# Patient Record
Sex: Female | Born: 1935
Health system: Southern US, Community
[De-identification: ages and names within clinical notes are randomized; demographics above are authoritative.]

## PROBLEM LIST (undated history)

## (undated) DIAGNOSIS — A389 Scarlet fever, uncomplicated: Secondary | ICD-10-CM

## (undated) DIAGNOSIS — I639 Cerebral infarction, unspecified: Secondary | ICD-10-CM

## (undated) DIAGNOSIS — E7800 Pure hypercholesterolemia, unspecified: Secondary | ICD-10-CM

## (undated) DIAGNOSIS — R55 Syncope and collapse: Secondary | ICD-10-CM

## (undated) HISTORY — DX: Syncope and collapse: R55

## (undated) HISTORY — PX: ABDOMINAL HYSTERECTOMY: SHX81

## (undated) HISTORY — DX: Cerebral infarction, unspecified: I63.9

---
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
## 2004-05-01 ENCOUNTER — Ambulatory Visit (HOSPITAL_COMMUNITY): Admission: RE | Admit: 2004-05-01 | Discharge: 2004-05-01 | Payer: Self-pay | Admitting: Gastroenterology

## 2007-02-27 ENCOUNTER — Encounter: Admission: RE | Admit: 2007-02-27 | Discharge: 2007-02-27 | Payer: Self-pay | Admitting: Orthopedic Surgery

## 2007-07-24 ENCOUNTER — Ambulatory Visit (HOSPITAL_BASED_OUTPATIENT_CLINIC_OR_DEPARTMENT_OTHER): Admission: RE | Admit: 2007-07-24 | Discharge: 2007-07-24 | Payer: Self-pay | Admitting: Orthopedic Surgery

## 2008-12-13 HISTORY — PX: TOTAL KNEE ARTHROPLASTY: SHX125

## 2009-04-14 ENCOUNTER — Inpatient Hospital Stay (HOSPITAL_COMMUNITY): Admission: RE | Admit: 2009-04-14 | Discharge: 2009-04-18 | Payer: Self-pay | Admitting: Orthopedic Surgery

## 2011-03-23 LAB — CBC
HCT: 26.5 % — ABNORMAL LOW (ref 36.0–46.0)
HCT: 26.8 % — ABNORMAL LOW (ref 36.0–46.0)
HCT: 29.8 % — ABNORMAL LOW (ref 36.0–46.0)
Hemoglobin: 10.2 g/dL — ABNORMAL LOW (ref 12.0–15.0)
Hemoglobin: 9.2 g/dL — ABNORMAL LOW (ref 12.0–15.0)
MCHC: 34.2 g/dL (ref 30.0–36.0)
MCHC: 35.1 g/dL (ref 30.0–36.0)
MCV: 91.3 fL (ref 78.0–100.0)
MCV: 91.7 fL (ref 78.0–100.0)
MCV: 92.1 fL (ref 78.0–100.0)
Platelets: 127 10*3/uL — ABNORMAL LOW (ref 150–400)
Platelets: 140 10*3/uL — ABNORMAL LOW (ref 150–400)
RBC: 2.9 MIL/uL — ABNORMAL LOW (ref 3.87–5.11)
RBC: 3.23 MIL/uL — ABNORMAL LOW (ref 3.87–5.11)
RDW: 14 % (ref 11.5–15.5)
WBC: 5.1 10*3/uL (ref 4.0–10.5)
WBC: 5.3 10*3/uL (ref 4.0–10.5)

## 2011-03-23 LAB — BASIC METABOLIC PANEL
BUN: 9 mg/dL (ref 6–23)
CO2: 30 mEq/L (ref 19–32)
Chloride: 103 mEq/L (ref 96–112)
Chloride: 105 mEq/L (ref 96–112)
GFR calc Af Amer: >60 mL/min (ref >60)
GFR calc non Af Amer: >60 mL/min (ref >60)
Glucose, Bld: 116 mg/dL — ABNORMAL HIGH (ref 70–99)
Potassium: 3.8 mEq/L (ref 3.5–5.1)
Potassium: 4.1 mEq/L (ref 3.5–5.1)
Sodium: 139 mEq/L (ref 135–145)
Sodium: 139 mEq/L (ref 135–145)

## 2011-03-23 LAB — CARDIAC PANEL(CRET KIN+CKTOT+MB+TROPI)
CK, MB: 2.6 ng/mL (ref 0.3–4.0)
Relative Index: 2.5 (ref 0.0–2.5)
Total CK: 103 U/L (ref 7–177)

## 2011-03-23 LAB — PROTIME-INR: Prothrombin Time: 31.2 seconds — ABNORMAL HIGH (ref 11.6–15.2)

## 2011-03-23 LAB — ABO/RH: ABO/RH(D): A NEG

## 2011-03-23 LAB — TYPE AND SCREEN: ABO/RH(D): A NEG

## 2011-03-24 LAB — URINALYSIS, ROUTINE W REFLEX MICROSCOPIC
Ketones, ur: NEGATIVE mg/dL
Nitrite: NEGATIVE
Protein, ur: NEGATIVE mg/dL

## 2011-03-24 LAB — COMPREHENSIVE METABOLIC PANEL
BUN: 23 mg/dL (ref 6–23)
CO2: 25 mEq/L (ref 19–32)
Calcium: 9.1 mg/dL (ref 8.4–10.5)
Creatinine, Ser: 0.78 mg/dL (ref 0.4–1.2)
GFR calc Af Amer: >60 mL/min (ref >60)
GFR calc non Af Amer: >60 mL/min (ref >60)
Glucose, Bld: 145 mg/dL — ABNORMAL HIGH (ref 70–99)

## 2011-03-24 LAB — CBC
Hemoglobin: 12.8 g/dL (ref 12.0–15.0)
MCHC: 34.9 g/dL (ref 30.0–36.0)
MCV: 91.1 fL (ref 78.0–100.0)
RBC: 4.01 MIL/uL (ref 3.87–5.11)

## 2011-03-24 LAB — APTT: aPTT: 29 seconds (ref 24–37)

## 2011-03-24 LAB — PROTIME-INR
INR: 1 (ref 0.00–1.49)
Prothrombin Time: 13.2 seconds (ref 11.6–15.2)

## 2011-03-24 LAB — URINE MICROSCOPIC-ADD ON

## 2011-04-27 NOTE — Discharge Summary (Signed)
Sheila Parrish, Sheila Parrish                ACCOUNT NO.:  [id number]   MEDICAL RECORD NO.:  [id number]          PATIENT TYPE:  INP   LOCATION:  1604                         FACILITY:  [facility]   PHYSICIAN:  Ollen Gross, M.D.    DATE OF BIRTH:  07/29/1927   DATE OF ADMISSION:  04/14/2009  DATE OF DISCHARGE:  04/18/2009                               DISCHARGE SUMMARY   ADMITTING DIAGNOSES:  1. Osteoarthritis of left knee.  2. Hypercholesterolemia.  3. Past history of scarlet fever.  4. Postmenopausal.  5. Childhood illnesses of measles   DISCHARGE DIAGNOSES:  1. Osteoarthritis of the left knee, status post left total knee      replacement arthroplasty.  2. Postoperative acute blood loss anemia; did not require transfusion.  3. Hypercholesterolemia.  4. Past history of scarlet fever.  5. Postmenopausal.  6. Childhood illnesses of measles   PROCEDURE:  Apr 14, 2009 left total knee.   ASSISTANT:  Alexzandrew L. Perkins, P.A.C.   ANESTHESIA:  Under spinal anesthesia with Duramorph added.   TOURNIQUET TIME:  27 minutes.   CONSULTS:  None.   BRIEF HISTORY:  Ms. Cesaro is an 77 year old female with end-stage  arthritis of the left knee with progressive worsening pain and  dysfunction.  Failed nonoperative management and now presents for left  total knee arthroplasty.   LABORATORY DATA:  Preop CBC:  Hemoglobin 12.8, hematocrit 36.6, white  cell count 4.9, platelets 200.  PT/INR 13.2/1.0 with PTT of 29.  Chemistry panel on admission:  All within normal limits, with exception  of mildly elevated glucose of 145.  Preop UA:  Small leukocytes, 36  white cells, few bacteria.  Serial CBCs followed throughout the hospital  course.  Hemoglobin dropped down to 10.2 and then 9.2, where it was  stabilized; came back up and the last hemoglobin 9.3 and hematocrit  26.5.  Serial Prothrombin times followed per Coumadin protocol.  Last  PT/INR 31.2 and 2.8.  Serial BMETs followed.  Electrolytes  remained  within normal limits.  She had one set of cardiac enzymes taken on Apr 17, 2009; CK normal at 103, CK-MB normal at 2.6, relative index normal at  2.5.  Troponin normal at 0.01.   X-RAYS:  Two-view chest April 07, 2009:  No active cardiopulmonary  disease.  EKG preop April 07, 2009:  Normal sinus rhythm, no significant  change from last tracing performed by Dr. Deloris Ping. Nahser.  Follow-up  EKG on Apr 17, 2009:  Normal sinus rhythm and nonspecific T-wave  abnormalities unconfirmed; but did not show any obvious changes as  compared to the previous EKG.   HOSPITAL COURSE:  The patient was admitted to [facility] and  taken to OR, underwent above-stated procedure without complication.  The  patient tolerated the procedure well, later transferred from the  recovery room to the orthopedic floor.  Started on PCA and p.o.  analgesic for pain control following surgery.   Doing pretty good on the morning of day #1; actually able to get some  sleep.  Was still using the PCA, so we continued  that and encouraged to  get over to p.o.  medications.  Had a decent output and followed this.  Started back on her home medications.  Hemoglobin was stable.  Blood  pressure looked good.   By day #2 she started to have a little bit of discomfort, a little  dizziness when she got up; but her hemoglobin was stable.  Felt to be  due to the narcotics.  We checked some orthostatics.  She was a little  orthostatic, but he felt that was due to some of the IV narcotics.  We  discontinued the PCA and the pressures did improve by the next day,  although she had a little bit of nausea.   Hemoglobin remained stable and she was rechecked on day #3.  Her  hemoglobin was 9.3.  The incision looked good.  She was slowly  progressing with physical therapy, but was unable to a lot of therapy on  day #3 because when she got up  she got a little dizzy again.  We  changed her medications and she had a little  bit of nausea.  We checked  an EKG, which did not show any changes; and also one set of cardiac  enzymes just to make sure there was no underlying cardiac issues.  Cardiac enzymes were normal.  EKG did not show any significant changes  or obvious changes.  Hemoglobin this day was felt to be due to the  narcotics.  We decreased the medications.  She continued with good urine  output.  By that afternoon her nausea had improved and she was up  walking about 95 feet on the end of day #3.   She was seen back on rounds on postoperative day #4.  Her GI symptoms  had resolved, it was felt be due to the pain medications.  This got  better after things were changed and went over to Ultracet.  Her INR was  therapeutic, actually at 2.8.  She was seen in rounds by Dr. Lequita Halt and  wanted to go home.   DISCHARGE PLAN:  1. Patient discharged home on Apr 18, 2009.  2. Discharge diagnoses:  Please see above.  3. Discharge Medications:  Ultracet, Robaxin and Coumadin.   FOLLOW-UP:  In 2 weeks.   ACTIVITY:  She is weightbearing as tolerated.  Total knee protocol to  left knee.  Home health PT.  Home health nursing may start showering  her; however, do not submerge incision under water.   DISPOSITION:  Home.   CONDITION ON DISCHARGE:  Improved.      Alexzandrew L. Perkins, P.A.C.      Ollen Gross, M.D.  Electronically Signed    ALP/MEDQ  D:  04/18/2009  T:  04/18/2009  Job:  [id number]   cc:   Thora Lance, M.D.  Fax: [phone]

## 2011-04-27 NOTE — Op Note (Signed)
NAMEAMYRAH, Sheila Parrish                ACCOUNT NO.:  [id number]   MEDICAL RECORD NO.:  [id number]          PATIENT TYPE:  AMB   LOCATION:  NESC                         FACILITY:  [facility]   PHYSICIAN:  Ollen Gross, M.D.    DATE OF BIRTH:  07/16/1927   DATE OF PROCEDURE:  07/24/2007  DATE OF DISCHARGE:                               OPERATIVE REPORT   PREOPERATIVE DIAGNOSIS:  Left knee medial meniscal tear and chondral  defect.   POSTOPERATIVE DIAGNOSIS:  Left knee medial meniscal tear and chondral  defect.   PROCEDURE:  Left knee arthroscopy with medial meniscal debridement and  chondroplasty.   SURGEON:  Dr. Lequita Halt.   ASSISTANT:  No assistant.   ANESTHESIA:  Local with MAC.   ESTIMATED BLOOD LOSS:  Minimal.   DRAIN:  None.   COMPLICATIONS:  None.   CONDITION.:  Stable to recovery.   CLINICAL NOTE:  [name] is a 77 year old female who has had a long  history of left knee pain and mechanical symptoms.  Exam and history  suggested meniscal tear.  She had one injection which provided temporary  relief.  Unfortunately pain has worsened and she presents now for  arthroscopic debridement.   DESCRIPTION OF PROCEDURE:  After successful administration of local with  MAC anesthetic. A tourniquet was placed high on the left thigh and left  lower extremity prepped and draped in the usual sterile fashion.  Her  portal sites are not included in the block and thus with 20 mL of 1%  Xylocaine with epi, I injected the superomedial, inferomedial and  inferolateral portal sites with a total of 20 mL of __________ 1% with  epi.  I then made superomedial and inferolateral incisions and passed  the inflow cannula superomedial __________  passed it inferolateral.  Arthroscopic visualization proceeds.  The undersurface of the patella  and the trochlea both looked normal.  The medial and lateral gutters  were visualized and there was no evidence of any loose bodies.  Flexion  and valgus force  was applied to the knee and the medial compartment is  entered.  There is evidence of a significant tear in the body and  posterior horn of the medial meniscus which was displaced.  A spinal  needle was used to localize the inferomedial portal, a small incision  made, dilator placed and the meniscus debrided back to a stable base  with baskets and a 4.2-mm shaver.  There is also evidence of exposed  bone and a very small focal area about 0.5 x 0.5 cm on the medial  femoral condyle.  There was also an area of grade 3 chondromalacia about  1 x 1 cm around this.  I debrided this back to a stable base with stable  edges using the shaver.  It is probed and found to be stable. The  intercondylar notch was visualized, the ACL is normal.  The lateral  compartment is entered and it is normal. The joint is again inspected,  no further tears, defects or loose bodies.  The arthroscopic equipment  was then removed  from the  inferior portals which are closed with interrupted 4-0 nylon.  20 mL of 0.25% Marcaine with epi injected through the inflow cannula and  that is removed and that portal closed with nylon.  A bulky sterile  dressing is applied and she is then awakened and transported to recovery  in stable condition.      Ollen Gross, M.D.  Electronically Signed     FA/MEDQ  D:  07/24/2007  T:  07/25/2007  Job:  [id number]

## 2011-04-27 NOTE — Op Note (Signed)
[name], CHRISTENBERRY NO.:  [id number]   MEDICAL RECORD NO.:  [id number]          PATIENT TYPE:  INP   LOCATION:  0002                         FACILITY:  [facility]   PHYSICIAN:  Ollen Gross, M.D.    DATE OF BIRTH:  [birth year]   DATE OF PROCEDURE:  04/14/2009  DATE OF DISCHARGE:                               OPERATIVE REPORT   PREOPERATIVE DIAGNOSIS:  Osteoarthritis, left knee.   POSTOPERATIVE DIAGNOSIS:  Osteoarthritis, left knee.   PROCEDURE:  Left total knee arthroplasty.   SURGEON:  Ollen Gross, M.D.   ASSISTANT:  Avel Peace, PA-C   ANESTHESIA:  Spinal.   ESTIMATED BLOOD LOSS:  Minimal.   DRAINS:  None.   TOURNIQUET TIME:  27 minutes at 300 mmHg.   COMPLICATIONS:  None.   CONDITION:  Stable to recovery.   BRIEF CLINICAL NOTE:  Sheila Parrish is an 77 year old female with end-  stage arthritis of the left knee with progressively worsening pain and  dysfunction.  She has failed nonoperative management and presents now  for left total knee arthroplasty.   PROCEDURE IN DETAIL:  After successful administration of spinal  anesthetic, a tourniquet was placed on her left thigh and her left lower  extremity was prepped and draped in the usual sterile fashion.  The  extremity was wrapped in Esmarch, knee flexed, tourniquet inflated to  300 mmHg.  Midline incision was made with a 10 blade through  subcutaneous tissue to the level of the extensor mechanism.  A fresh  blade was used to make a medial parapatellar arthrotomy.  Soft tissue of  the proximal medial tibia subperiosteally elevated to the joint line  with the knife and at the semimembranosus bursa with a Cobb elevator.  Soft tissue laterally is elevated with attention being paid to avoid the  patellar tendon on tibial tubercle.  Patella subluxed laterally, knee  flexed 90 degrees, and ACL and PCL removed.  A drill was used to create  a starting hole in the distal femur and the canal was thoroughly  irrigated.  The 5-degree left valgus alignment guide was placed  referencing off the posterior condyles, rotations marked, and a block  pinned to remove 10 mm off the distal femur.  Distal femoral resection  is made with an oscillating saw.  Sizing blocks placed, size 2.5 is most  appropriate.  Rotation is marked off the epicondylar axis.  Size 2.5  cutting block is placed and the anterior, posterior and chamfer cuts  made.   Tibia subluxed forward and menisci removed.  Extramedullary tibial  alignment guide is placed referencing proximally at the medial aspect of  the tibial tubercle and distally along the second metatarsal axis and  tibial crest.  Block is pinned to remove 10 mm off the nondeficient  lateral side.  Tibial resection is made with an oscillating saw.  Size  2.5 is the most appropriate tibial component and the proximal tibia is  prepared to modular drill and keel punch for the size 2.5.  Femoral  preparation is completed with the intercondylar cut.   A size 2.5  mobile bearing tibial trial, 2.5 posterior stabilized femoral  trial and a 10-mm posterior stabilized rotating platform insert trial  are placed.  With the 10, full extension is achieved with excellent  varus-valgus and anterior-posterior balance throughout full range of  motion.  Patella was then everted and thickness measured to be 22 mm.  Freehand resection was taken at 12 mm, 35 template is placed, lug holes  are drilled, trial patella is placed and it tracks normally.  Osteophytes were removed off the posterior femur with the trial in  place.  All trials were removed and the cut bone surfaces are prepared  with pulsatile lavage.  Cement was mixed and once ready for  implantation, the size 2.5 mobile bearing tibial tray, 2.5 posterior  stabilized femur and 35 patella are cemented into place.  The patella  was held with a clamp.  Trial 10-mm insert is placed, knee held in full  extension, and all extruded cement  removed.  When the cement is fully  hardened and the knee permanent, 10-mm posterior stabilized rotating  platform insert is placed into the tibial tray.  The wound was copiously  irrigated with saline solution and FloSeal injected onto the posterior  capsule, medial and lateral gutters and suprapatellar area.  Moist  sponge is placed and tourniquet released for a total time of 27 minutes.  Sponge was held for 2 minutes and then removed.  Minimal bleeding is  encountered.  The bleeding that is encountered is stopped with  electrocautery.  Wounds again irrigated and the arthrotomy closed with  interrupted #1 PDS.  Flexion against gravity is about 140 degrees.  Subcu tissues were closed with interrupted 2-0 Vicryl and subcuticular  running 4-0 Monocryl.  The incision is cleaned and dried and Steri-  Strips and a bulky sterile dressing are applied.  She is then placed  into a knee immobilizer, awakened, and transported to recovery in stable  condition.      Ollen Gross, M.D.  Electronically Signed     FA/MEDQ  D:  04/14/2009  T:  04/14/2009  Job:  [id number]

## 2011-04-30 NOTE — H&P (Signed)
NAMESHAMIR, Sheila Parrish                ACCOUNT NO.:  [id number]   MEDICAL RECORD NO.:  [id number]          PATIENT TYPE:  INP   LOCATION:  NA                           FACILITY:  [facility]   PHYSICIAN:  Ollen Gross, M.D.    DATE OF BIRTH:  07/23/1927   DATE OF ADMISSION:  04/14/2009  DATE OF DISCHARGE:                              HISTORY & PHYSICAL   CHIEF COMPLAINT:  Left knee pain.   HISTORY OF PRESENT ILLNESS:  The patient is an 77 year old female who  has seen by Dr. Lequita Halt for a chief complaint of left knee pain that has  been ongoing for quite some time now.  It is worse with bending and  squatting.  She is seen in the office and found to have significant  narrowing medial compartment which is now gone on to bone-on-bone  patellofemoral narrowing, markedly increased arthritis over the past  year.  She has undergone arthroscopy in the past.  She has also  undergone cortisone injections, viscous supplementation injections  without benefit.  Felt she would benefit undergoing surgical  intervention.  Risks and benefits have been discussed.  She elects to  proceed with surgery.  She has been seen preoperatively by Dr. Kirby Funk and felt to be stable for surgery.   ALLERGIES:  SULFA, BACTRIM, MACRODANTIN, ERYTHROMYCIN.   CURRENT MEDICATIONS:  1. Simvastatin 20 mg daily.  2. Calcium plus D 1500 mg daily.  3. Multivitamin daily.   PAST MEDICAL HISTORY:  Hypercholesterolemia.  Past history of scarlet  fever, postmenopausal.  Childhood illnesses of measles.   PAST SURGICAL HISTORY:  Colon surgery 1950, hysterectomy 1979.   FAMILY HISTORY:  Noncontributory.   SOCIAL HISTORY:  Married homemaker, nonsmoker.  No alcohol.  Three  children.  Lives with husband.  Husband and daughter will be assisting  with care after surgery.  She does have four steps entering her 2-level  home.  She does have a full flight of steps and she does have a living  will and healthcare power of  attorney.   REVIEW OF SYSTEMS:  GENERAL:  No fevers, chills or night sweats.  NEURO:  No seizures, syncope, or paralysis.  RESPIRATORY:  No productive cough  or hemoptysis.  CARDIOVASCULAR:  No chest pain, angina.  GI: No nausea,  diarrhea, constipation.  GU: No dysuria or discharge.  MUSCULOSKELETAL:  Knee pain.   PHYSICAL:  VITAL SIGNS:  Pulse 76, respirations 14, blood pressure  116/68.  GENERAL:  An 77 year old white female well-nourished, well-developed,  short-statured.  She is alert and cooperative, pleasant.  HEENT:  Normocephalic, atraumatic.  Pupils are reactive.  EOMs intact.  NECK:  Supple.  CHEST:  Clear.  HEART:  Regular rate and rhythm.  No murmur, S1, S2.  ABDOMEN:  Soft, nontender.  Bowel sounds present.  RECTAL, BREASTS, GENITALIA:  Not done not pertinent to present illness.  EXTREMITIES:  Left knee no effusion.  Range of motion 5-120 marked  crepitus.   IMPRESSION:  Osteoarthritis left knee.   PLAN:  The patient admitted [facility] undergo a left total  knee  replacement arthroplasty.  Surgery will be performed by Dr. Ollen Gross.      Alexzandrew L. Perkins, P.A.C.      Ollen Gross, M.D.  Electronically Signed    ALP/MEDQ  D:  04/10/2009  T:  04/10/2009  Job:  [id number]   cc:   Thora Lance, M.D.  Fax: [phone]   Ollen Gross, M.D.  Fax: [phone]

## 2011-09-27 LAB — POCT HEMOGLOBIN-HEMACUE: Hemoglobin: 13.5

## 2012-05-09 DIAGNOSIS — G4700 Insomnia, unspecified: Secondary | ICD-10-CM | POA: Diagnosis not present

## 2012-05-09 DIAGNOSIS — Z1331 Encounter for screening for depression: Secondary | ICD-10-CM | POA: Diagnosis not present

## 2012-05-09 DIAGNOSIS — E785 Hyperlipidemia, unspecified: Secondary | ICD-10-CM | POA: Diagnosis not present

## 2012-06-23 DIAGNOSIS — Z79899 Other long term (current) drug therapy: Secondary | ICD-10-CM | POA: Diagnosis not present

## 2012-06-23 DIAGNOSIS — E785 Hyperlipidemia, unspecified: Secondary | ICD-10-CM | POA: Diagnosis not present

## 2012-08-07 DIAGNOSIS — H00019 Hordeolum externum unspecified eye, unspecified eyelid: Secondary | ICD-10-CM | POA: Diagnosis not present

## 2012-08-24 DIAGNOSIS — H00019 Hordeolum externum unspecified eye, unspecified eyelid: Secondary | ICD-10-CM | POA: Diagnosis not present

## 2012-08-24 DIAGNOSIS — K208 Other esophagitis without bleeding: Secondary | ICD-10-CM | POA: Diagnosis not present

## 2012-08-31 DIAGNOSIS — H2510 Age-related nuclear cataract, unspecified eye: Secondary | ICD-10-CM | POA: Diagnosis not present

## 2012-08-31 DIAGNOSIS — H25019 Cortical age-related cataract, unspecified eye: Secondary | ICD-10-CM | POA: Diagnosis not present

## 2012-09-05 DIAGNOSIS — Z23 Encounter for immunization: Secondary | ICD-10-CM | POA: Diagnosis not present

## 2012-11-01 DIAGNOSIS — Z1231 Encounter for screening mammogram for malignant neoplasm of breast: Secondary | ICD-10-CM | POA: Diagnosis not present

## 2013-02-08 DIAGNOSIS — L570 Actinic keratosis: Secondary | ICD-10-CM | POA: Diagnosis not present

## 2013-02-21 ENCOUNTER — Emergency Department (HOSPITAL_COMMUNITY): Payer: Medicare Other

## 2013-02-21 ENCOUNTER — Emergency Department (HOSPITAL_COMMUNITY)
Admission: EM | Admit: 2013-02-21 | Discharge: 2013-02-21 | Disposition: A | Discharge status: Home / Self Care | Payer: Medicare Other | Attending: Emergency Medicine | Admitting: Emergency Medicine

## 2013-02-21 ENCOUNTER — Encounter (HOSPITAL_COMMUNITY): Payer: Self-pay | Admitting: Family Medicine

## 2013-02-21 DIAGNOSIS — Z79899 Other long term (current) drug therapy: Secondary | ICD-10-CM | POA: Insufficient documentation

## 2013-02-21 DIAGNOSIS — E7800 Pure hypercholesterolemia, unspecified: Secondary | ICD-10-CM | POA: Insufficient documentation

## 2013-02-21 DIAGNOSIS — R079 Chest pain, unspecified: Secondary | ICD-10-CM | POA: Diagnosis not present

## 2013-02-21 DIAGNOSIS — K219 Gastro-esophageal reflux disease without esophagitis: Secondary | ICD-10-CM | POA: Insufficient documentation

## 2013-02-21 DIAGNOSIS — R109 Unspecified abdominal pain: Secondary | ICD-10-CM | POA: Diagnosis not present

## 2013-02-21 DIAGNOSIS — R072 Precordial pain: Secondary | ICD-10-CM | POA: Diagnosis not present

## 2013-02-21 HISTORY — DX: Pure hypercholesterolemia, unspecified: E78.00

## 2013-02-21 LAB — CBC
MCV: 88.9 fL (ref 78.0–100.0)
Platelets: 162 10*3/uL (ref 150–400)
RBC: 4.07 MIL/uL (ref 3.87–5.11)
RDW: 13.7 % (ref 11.5–15.5)
WBC: 3.7 10*3/uL — ABNORMAL LOW (ref 4.0–10.5)

## 2013-02-21 LAB — COMPREHENSIVE METABOLIC PANEL
ALT: 16 U/L (ref 0–35)
AST: 27 U/L (ref 0–37)
Albumin: 3.1 g/dL — ABNORMAL LOW (ref 3.5–5.2)
CO2: 25 mEq/L (ref 19–32)
Calcium: 8.6 mg/dL (ref 8.4–10.5)
Chloride: 108 mEq/L (ref 96–112)
Creatinine, Ser: 0.79 mg/dL (ref 0.50–1.10)
GFR calc non Af Amer: 74 mL/min — ABNORMAL LOW (ref >90)
Sodium: 141 mEq/L (ref 135–145)
Total Bilirubin: 0.3 mg/dL (ref 0.3–1.2)

## 2013-02-21 LAB — POCT I-STAT TROPONIN I: Troponin i, poc: 0.01 ng/mL (ref 0.00–0.08)

## 2013-02-21 NOTE — ED Notes (Signed)
Dr. Plunkett at bedside.  

## 2013-02-21 NOTE — ED Provider Notes (Signed)
History     CSN: [id number]  Arrival date & time 02/21/13  1230   First MD Initiated Contact with Patient 02/21/13 1239      Chief Complaint  Patient presents with  . Chest Pain    (Consider location/radiation/quality/duration/timing/severity/associated sxs/prior treatment) Patient is a 79 y.o. female presenting with chest pain. The history is provided by the patient.  Chest Pain Pain location:  Substernal area Pain quality: sharp, shooting and stabbing   Pain radiates to:  Upper back Pain radiates to the back: yes   Pain severity:  Severe Onset quality:  Sudden Timing:  Constant Progression:  Resolved Chronicity:  New Context: eating   Context: not breathing   Relieved by:  Nothing Worsened by:  Nothing tried Ineffective treatments:  Antacids Associated symptoms: no abdominal pain, no cough, no diaphoresis, no fever, no nausea, no near-syncope, no shortness of breath, not vomiting and no weakness   Risk factors: high cholesterol   Risk factors: no coronary artery disease, no diabetes mellitus, no hypertension, no prior DVT/PE and no smoking     Past Medical History  Diagnosis Date  . High cholesterol     Past Surgical History  Procedure Laterality Date  . Abdominal hysterectomy      History reviewed. No pertinent family history.  History  Substance Use Topics  . Smoking status: Never Smoker   . Smokeless tobacco: Not on file  . Alcohol Use: No    OB History   Grav Para Term Preterm Abortions TAB SAB Ect Mult Living                  Review of Systems  Constitutional: Negative for fever and diaphoresis.  Respiratory: Negative for cough and shortness of breath.   Cardiovascular: Positive for chest pain. Negative for near-syncope.  Gastrointestinal: Negative for nausea, vomiting and abdominal pain.  Neurological: Negative for weakness.  All other systems reviewed and are negative.    Allergies  Bactrim and Sulfa antibiotics  Home Medications    Current Outpatient Rx  Name  Route  Sig  Dispense  Refill  . atorvastatin (LIPITOR) 10 MG tablet   Oral   Take 10 mg by mouth daily.         . Calcium Citrate-Vitamin D (CITRACAL + D PO)   Oral   Take 2 tablets by mouth 2 (two) times daily. Take 2 tablets after breakfast and 2 tablets after lunch         . Multiple Vitamin (MULTIVITAMIN WITH MINERALS) TABS   Oral   Take 1 tablet by mouth daily.           There were no vitals taken for this visit.  Physical Exam  Nursing note and vitals reviewed. Constitutional: She is oriented to person, place, and time. She appears well-developed and well-nourished. No distress.  HENT:  Head: Normocephalic and atraumatic.  Mouth/Throat: Oropharynx is clear and moist.  Eyes: Conjunctivae and EOM are normal. Pupils are equal, round, and reactive to light.  Neck: Normal range of motion. Neck supple.  Cardiovascular: Normal rate, regular rhythm and intact distal pulses.   No murmur heard. Pulmonary/Chest: Effort normal and breath sounds normal. No respiratory distress. She has no wheezes. She has no rales.  Abdominal: Soft. She exhibits no distension. There is no tenderness. There is no rebound and no guarding.  Musculoskeletal: Normal range of motion. She exhibits no edema and no tenderness.  Neurological: She is alert and oriented to person, place, and time.  Skin: Skin is warm and dry. No rash noted. No erythema.  Psychiatric: She has a normal mood and affect. Her behavior is normal.    ED Course  Procedures (including critical care time)  Labs Reviewed  CBC - Abnormal; Notable for the following:    WBC 3.7 (*)    All other components within normal limits  COMPREHENSIVE METABOLIC PANEL - Abnormal; Notable for the following:    Total Protein 5.9 (*)    Albumin 3.1 (*)    GFR calc non Af Amer 74 (*)    GFR calc Af Amer 86 (*)    All other components within normal limits  LIPASE, BLOOD  POCT I-STAT TROPONIN I   Dg Chest 2  View  02/21/2013  *RADIOLOGY REPORT*  Clinical Data: Mid chest pain radiating to the back.  CHEST - 2 VIEW  Comparison: 04/07/2009.  Findings: Trachea is midline.  Heart size stable.  Lungs are somewhat low in volume with minimal bibasilar scarring.  No pleural fluid.  IMPRESSION: No acute findings.   Original Report Authenticated By: Leanna Battles, M.D.      Date: 02/21/2013  Rate: 71  Rhythm: normal sinus rhythm  QRS Axis: normal  Intervals: normal  ST/T Wave abnormalities: normal  Conduction Disutrbances: none  Narrative Interpretation: unremarkable      No diagnosis found.    MDM   Patient coming in with a complaint of chest pain that started today after she ate cereal that lasted from 8-12.  She states it started in her lower sternal region and radiated directly into her back. No nausea, vomiting, diaphoresis or shortness of breath. Patient took medication for reflux which did not resolve her symptoms. On the way to the hospital the symptoms resolved. She is currently asymptomatic. There is no abdominal pain, reproducible chest pain or other exam findings. Patient has no prior cardiac history. Her only risk factor is hyperlipidemia and age. She's never had a stress test and has no heart history and she or her family.  EKG is within normal limits. Lower risk for cardiac etiology given patient's history. Feel most likely GI in origin patient recently had a viral illness with diarrhea and abdominal cramping. She also suffers from GERD.  CBC, CMP, lipase, troponin, chest x-ray pending. [name] Kitchen 3:29 PM Labs wnl.  Troponin neg and this is 6 hours out from when pain started.  Low suspicion for cardiac issue given story.  Will d/c home to f/u with PCP for ongoing monitoring and possible stress and gallbladder U/S.       Gwyneth Sprout, MD 02/21/13 [phone]

## 2013-02-21 NOTE — ED Notes (Signed)
PT to X-ray

## 2013-02-21 NOTE — ED Notes (Signed)
Per EMS, pt having chest pain that started at 9 am. Pt recently getting over a bug with diarrhea and does have hx of GERD. Pt given 324 ASA. Pain 0/10 after ASA. BP 150/68. NSR on the monitor. Pt A&O.

## 2013-04-09 DIAGNOSIS — M545 Low back pain, unspecified: Secondary | ICD-10-CM | POA: Diagnosis not present

## 2013-04-09 DIAGNOSIS — M542 Cervicalgia: Secondary | ICD-10-CM | POA: Diagnosis not present

## 2013-04-09 DIAGNOSIS — M25559 Pain in unspecified hip: Secondary | ICD-10-CM | POA: Diagnosis not present

## 2013-05-02 DIAGNOSIS — D239 Other benign neoplasm of skin, unspecified: Secondary | ICD-10-CM | POA: Diagnosis not present

## 2013-05-02 DIAGNOSIS — B070 Plantar wart: Secondary | ICD-10-CM | POA: Diagnosis not present

## 2013-05-02 DIAGNOSIS — L821 Other seborrheic keratosis: Secondary | ICD-10-CM | POA: Diagnosis not present

## 2013-05-02 DIAGNOSIS — D1801 Hemangioma of skin and subcutaneous tissue: Secondary | ICD-10-CM | POA: Diagnosis not present

## 2013-05-02 DIAGNOSIS — D235 Other benign neoplasm of skin of trunk: Secondary | ICD-10-CM | POA: Diagnosis not present

## 2013-05-02 DIAGNOSIS — Z85828 Personal history of other malignant neoplasm of skin: Secondary | ICD-10-CM | POA: Diagnosis not present

## 2013-05-30 ENCOUNTER — Encounter: Payer: Self-pay | Admitting: Sports Medicine

## 2013-05-30 ENCOUNTER — Ambulatory Visit (INDEPENDENT_AMBULATORY_CARE_PROVIDER_SITE_OTHER): Payer: Medicare Other | Admitting: Sports Medicine

## 2013-05-30 VITALS — BP 157/83 | HR 75 | Ht 61.5 in | Wt 120.0 lb

## 2013-05-30 DIAGNOSIS — S21209A Unspecified open wound of unspecified back wall of thorax without penetration into thoracic cavity, initial encounter: Secondary | ICD-10-CM | POA: Diagnosis not present

## 2013-05-30 DIAGNOSIS — S3992XA Unspecified injury of lower back, initial encounter: Secondary | ICD-10-CM | POA: Insufficient documentation

## 2013-05-30 NOTE — Progress Notes (Signed)
Chief complaint: Back pain  History of present illness: Patient is a very pleasant 79 year old female who unfortunately 2 months ago had a fall. Patient was spraying yellow jackets in one fluid at her face and she fell directly on concrete onto her back. Patient states that she had severe pain. Patient went and saw Dr. Valentina Lucks and had multiple x-rays. I do not see any type of fractures. Patient then states that she continued to have low back pain. Patient's neck pain seems to be resolving. Over the course last 2 months though she states that her low back pain has started to improve as well. Patient states that she still has mild to moderate discomfort at the end of a long day but overall she sleeps comfortably, denies any radiation numbness or weakness of the lower extremities, denies any abnormal weight changes. Patient has been taking Tylenol and Aleve on an as needed basis and has not taken any for approximately 1 week. Patient needs to stay active because she is the primary caregiver for her husband who does have a heart condition. Patient has been walking on a daily basis without any discomfort.  Past Medical History  Diagnosis Date  . High cholesterol    Past Surgical History  Procedure Laterality Date  . Abdominal hysterectomy     No family history on file. History  Substance Use Topics  . Smoking status: Never Smoker   . Smokeless tobacco: Never Used  . Alcohol Use: No    Physical exam Blood pressure 157/83, pulse 75, height 5' 1.5" (1.562 m), weight 120 lb (54.432 kg). General: No apparent distress alert and oriented x3 mood and affect normal appears younger than stated age Respiratory: Patient's speak in full sentences and does not appear short of breath Skin: Warm dry intact with no signs of infection or rash Neuro: Cranial nerves II through XII are intact, neurovascularly intact in all extremities with 2+ DTRs and 2+ pulses. Back exam: On inspection patient has very good  posture. She is a negative straight leg test and a negative Faber test. She is neurovascularly intact distally with 2+ DTRs. Patient has very good range of motion. Addition this patient has been go test that is phenomenal. She is able to step up and down on a step 10 times without any trouble with balance. Patient is able to walk heel to toe without any trouble. Patient has great strength lower joint is bilaterally

## 2013-05-30 NOTE — Assessment & Plan Note (Signed)
Patient has a back soft tissue injury this seems to be resolving. Patient doing very well and has no signs of osteoporosis and has great strength and balance for her age.  I feel that if we're aggressive in treatment we can cause potentially more harm than good. Patient was given a home exercise program focusing more on range of motion and stretching and strengthening. Encourage her to continue walking on a daily basis. At this time I feel that patient can followup on an as-needed basis with [place].

## 2013-05-30 NOTE — Patient Instructions (Signed)
Very nice to meet you I am glad to see you are doing better.  I am giving you some exercises to see if e can stretch and strengthen your back.  I think you will continue to get better over time but if you get worse please come back and see [place] again.

## 2013-06-01 DIAGNOSIS — E785 Hyperlipidemia, unspecified: Secondary | ICD-10-CM | POA: Diagnosis not present

## 2013-06-01 DIAGNOSIS — Z0000 Encounter for general adult medical examination without abnormal findings: Secondary | ICD-10-CM | POA: Diagnosis not present

## 2013-06-01 DIAGNOSIS — Z1331 Encounter for screening for depression: Secondary | ICD-10-CM | POA: Diagnosis not present

## 2013-07-04 DIAGNOSIS — M810 Age-related osteoporosis without current pathological fracture: Secondary | ICD-10-CM | POA: Diagnosis not present

## 2013-07-04 DIAGNOSIS — M899 Disorder of bone, unspecified: Secondary | ICD-10-CM | POA: Diagnosis not present

## 2013-08-09 DIAGNOSIS — M810 Age-related osteoporosis without current pathological fracture: Secondary | ICD-10-CM | POA: Diagnosis not present

## 2013-08-09 DIAGNOSIS — J309 Allergic rhinitis, unspecified: Secondary | ICD-10-CM | POA: Diagnosis not present

## 2013-09-05 DIAGNOSIS — H25019 Cortical age-related cataract, unspecified eye: Secondary | ICD-10-CM | POA: Diagnosis not present

## 2013-09-05 DIAGNOSIS — H2510 Age-related nuclear cataract, unspecified eye: Secondary | ICD-10-CM | POA: Diagnosis not present

## 2013-09-11 DIAGNOSIS — Z23 Encounter for immunization: Secondary | ICD-10-CM | POA: Diagnosis not present

## 2013-11-02 DIAGNOSIS — Z1231 Encounter for screening mammogram for malignant neoplasm of breast: Secondary | ICD-10-CM | POA: Diagnosis not present

## 2013-12-21 DIAGNOSIS — R350 Frequency of micturition: Secondary | ICD-10-CM | POA: Diagnosis not present

## 2013-12-21 DIAGNOSIS — N390 Urinary tract infection, site not specified: Secondary | ICD-10-CM | POA: Diagnosis not present

## 2014-01-14 ENCOUNTER — Other Ambulatory Visit: Payer: Self-pay | Admitting: Internal Medicine

## 2014-01-14 ENCOUNTER — Ambulatory Visit
Admission: RE | Admit: 2014-01-14 | Discharge: 2014-01-14 | Disposition: A | Discharge status: Home / Self Care | Payer: Medicare Other | Source: Ambulatory Visit | Attending: Internal Medicine | Admitting: Internal Medicine

## 2014-01-14 DIAGNOSIS — M546 Pain in thoracic spine: Secondary | ICD-10-CM | POA: Diagnosis not present

## 2014-04-20 DIAGNOSIS — J189 Pneumonia, unspecified organism: Secondary | ICD-10-CM | POA: Diagnosis not present

## 2014-04-20 DIAGNOSIS — R509 Fever, unspecified: Secondary | ICD-10-CM | POA: Diagnosis not present

## 2014-05-02 DIAGNOSIS — D235 Other benign neoplasm of skin of trunk: Secondary | ICD-10-CM | POA: Diagnosis not present

## 2014-05-02 DIAGNOSIS — D1801 Hemangioma of skin and subcutaneous tissue: Secondary | ICD-10-CM | POA: Diagnosis not present

## 2014-05-02 DIAGNOSIS — Z85828 Personal history of other malignant neoplasm of skin: Secondary | ICD-10-CM | POA: Diagnosis not present

## 2014-05-02 DIAGNOSIS — D239 Other benign neoplasm of skin, unspecified: Secondary | ICD-10-CM | POA: Diagnosis not present

## 2014-05-02 DIAGNOSIS — L821 Other seborrheic keratosis: Secondary | ICD-10-CM | POA: Diagnosis not present

## 2014-06-04 DIAGNOSIS — Z1331 Encounter for screening for depression: Secondary | ICD-10-CM | POA: Diagnosis not present

## 2014-06-04 DIAGNOSIS — Z23 Encounter for immunization: Secondary | ICD-10-CM | POA: Diagnosis not present

## 2014-06-04 DIAGNOSIS — M810 Age-related osteoporosis without current pathological fracture: Secondary | ICD-10-CM | POA: Diagnosis not present

## 2014-09-02 ENCOUNTER — Ambulatory Visit (INDEPENDENT_AMBULATORY_CARE_PROVIDER_SITE_OTHER): Payer: Medicare Other | Admitting: Podiatry

## 2014-09-02 ENCOUNTER — Encounter: Payer: Self-pay | Admitting: Podiatry

## 2014-09-02 VITALS — Ht 62.0 in | Wt 115.0 lb

## 2014-09-02 DIAGNOSIS — M2010 Hallux valgus (acquired), unspecified foot: Secondary | ICD-10-CM | POA: Diagnosis not present

## 2014-09-02 DIAGNOSIS — L608 Other nail disorders: Secondary | ICD-10-CM

## 2014-09-02 NOTE — Progress Notes (Signed)
   Subjective:    Patient ID: Sheila Parrish, female    DOB: 02-01-1927, 80 y.o.   MRN: [id number]  HPI Comments: N nail problem L left 1st toenail lateral border, some tenderness to the right 1st toenail D 1 month ago O after visit to pedicure salon C painfulness at left 1st lateral toenail A pressure T pt states only trimmed the right 1st toenail again  Pt states she noticed white spots on the B/L 3rd toenails in the last 2 months.     Review of Systems  All other systems reviewed and are negative.      Objective:   Physical Exam  Orientated x3 white female  Vascular: DP and PT pulses 2/4 bilaterally  Neurological: Sensation to 10 g monofilament wire intact 5/5 bilaterally Ankle reflex equal and reactive bilaterally  Dermatological: Occasional white patches noted in toenails 1 to left and 2 right. The nails otherwise have normal trophic appearance.  Musculoskeletal: HAV deformity left Tailor's bunions bilaterally No restriction ankle, subtalar, midtarsal joints bilaterally       Assessment & Plan:   Assessment: Satisfactory neurovascular status White patches noted in the toenails do not appear to have any clinical significance Patient advised that no treatment or further evaluation of the toenails was indicated at this time. HAV deformity left Bilateral tailor's bunions

## 2014-09-03 ENCOUNTER — Encounter: Payer: Self-pay | Admitting: Podiatry

## 2014-09-10 DIAGNOSIS — H25019 Cortical age-related cataract, unspecified eye: Secondary | ICD-10-CM | POA: Diagnosis not present

## 2014-09-10 DIAGNOSIS — H2510 Age-related nuclear cataract, unspecified eye: Secondary | ICD-10-CM | POA: Diagnosis not present

## 2014-09-11 DIAGNOSIS — Z23 Encounter for immunization: Secondary | ICD-10-CM | POA: Diagnosis not present

## 2014-10-28 DIAGNOSIS — H25013 Cortical age-related cataract, bilateral: Secondary | ICD-10-CM | POA: Diagnosis not present

## 2014-11-11 DIAGNOSIS — Z1231 Encounter for screening mammogram for malignant neoplasm of breast: Secondary | ICD-10-CM | POA: Diagnosis not present

## 2014-11-21 DIAGNOSIS — H2511 Age-related nuclear cataract, right eye: Secondary | ICD-10-CM | POA: Diagnosis not present

## 2014-11-21 DIAGNOSIS — H25012 Cortical age-related cataract, left eye: Secondary | ICD-10-CM | POA: Diagnosis not present

## 2014-11-21 DIAGNOSIS — H25811 Combined forms of age-related cataract, right eye: Secondary | ICD-10-CM | POA: Diagnosis not present

## 2014-11-21 DIAGNOSIS — H25011 Cortical age-related cataract, right eye: Secondary | ICD-10-CM | POA: Diagnosis not present

## 2014-12-19 DIAGNOSIS — H25012 Cortical age-related cataract, left eye: Secondary | ICD-10-CM | POA: Diagnosis not present

## 2014-12-19 DIAGNOSIS — H269 Unspecified cataract: Secondary | ICD-10-CM | POA: Diagnosis not present

## 2014-12-19 DIAGNOSIS — H25812 Combined forms of age-related cataract, left eye: Secondary | ICD-10-CM | POA: Diagnosis not present

## 2015-05-07 DIAGNOSIS — D1801 Hemangioma of skin and subcutaneous tissue: Secondary | ICD-10-CM | POA: Diagnosis not present

## 2015-05-07 DIAGNOSIS — Z85828 Personal history of other malignant neoplasm of skin: Secondary | ICD-10-CM | POA: Diagnosis not present

## 2015-05-07 DIAGNOSIS — L821 Other seborrheic keratosis: Secondary | ICD-10-CM | POA: Diagnosis not present

## 2015-05-07 DIAGNOSIS — L820 Inflamed seborrheic keratosis: Secondary | ICD-10-CM | POA: Diagnosis not present

## 2015-05-07 DIAGNOSIS — D2261 Melanocytic nevi of right upper limb, including shoulder: Secondary | ICD-10-CM | POA: Diagnosis not present

## 2015-06-06 DIAGNOSIS — R636 Underweight: Secondary | ICD-10-CM | POA: Diagnosis not present

## 2015-06-06 DIAGNOSIS — Z1389 Encounter for screening for other disorder: Secondary | ICD-10-CM | POA: Diagnosis not present

## 2015-06-06 DIAGNOSIS — M810 Age-related osteoporosis without current pathological fracture: Secondary | ICD-10-CM | POA: Diagnosis not present

## 2015-06-06 DIAGNOSIS — Z0000 Encounter for general adult medical examination without abnormal findings: Secondary | ICD-10-CM | POA: Diagnosis not present

## 2015-06-20 ENCOUNTER — Other Ambulatory Visit (HOSPITAL_COMMUNITY): Payer: Self-pay

## 2015-06-23 ENCOUNTER — Ambulatory Visit (HOSPITAL_COMMUNITY)
Admission: RE | Admit: 2015-06-23 | Discharge: 2015-06-23 | Disposition: A | Discharge status: Home / Self Care | Payer: Medicare Other | Source: Ambulatory Visit | Attending: Internal Medicine | Admitting: Internal Medicine

## 2015-06-23 DIAGNOSIS — M810 Age-related osteoporosis without current pathological fracture: Secondary | ICD-10-CM | POA: Diagnosis not present

## 2015-06-23 MED ORDER — ZOLEDRONIC ACID 5 MG/100ML IV SOLN
INTRAVENOUS | Status: AC
  Filled 2015-06-23: qty 100

## 2015-06-23 MED ORDER — ZOLEDRONIC ACID 5 MG/100ML IV SOLN
5.0000 mg | Freq: Once | INTRAVENOUS | Status: AC
  Administered 2015-06-23: 5 mg via INTRAVENOUS

## 2015-06-23 MED ORDER — SODIUM CHLORIDE 0.9 % IV SOLN
INTRAVENOUS | Status: DC
  Administered 2015-06-23: 12:00:00 via INTRAVENOUS

## 2015-09-08 DIAGNOSIS — Z23 Encounter for immunization: Secondary | ICD-10-CM | POA: Diagnosis not present

## 2015-11-13 DIAGNOSIS — Z1231 Encounter for screening mammogram for malignant neoplasm of breast: Secondary | ICD-10-CM | POA: Diagnosis not present

## 2016-01-27 DIAGNOSIS — Z961 Presence of intraocular lens: Secondary | ICD-10-CM | POA: Diagnosis not present

## 2016-05-06 DIAGNOSIS — L603 Nail dystrophy: Secondary | ICD-10-CM | POA: Diagnosis not present

## 2016-05-06 DIAGNOSIS — Z85828 Personal history of other malignant neoplasm of skin: Secondary | ICD-10-CM | POA: Diagnosis not present

## 2016-05-06 DIAGNOSIS — D2272 Melanocytic nevi of left lower limb, including hip: Secondary | ICD-10-CM | POA: Diagnosis not present

## 2016-05-06 DIAGNOSIS — D1801 Hemangioma of skin and subcutaneous tissue: Secondary | ICD-10-CM | POA: Diagnosis not present

## 2016-05-06 DIAGNOSIS — D2261 Melanocytic nevi of right upper limb, including shoulder: Secondary | ICD-10-CM | POA: Diagnosis not present

## 2016-05-06 DIAGNOSIS — L814 Other melanin hyperpigmentation: Secondary | ICD-10-CM | POA: Diagnosis not present

## 2016-05-06 DIAGNOSIS — L720 Epidermal cyst: Secondary | ICD-10-CM | POA: Diagnosis not present

## 2016-05-06 DIAGNOSIS — L821 Other seborrheic keratosis: Secondary | ICD-10-CM | POA: Diagnosis not present

## 2016-06-07 DIAGNOSIS — M810 Age-related osteoporosis without current pathological fracture: Secondary | ICD-10-CM | POA: Diagnosis not present

## 2016-06-07 DIAGNOSIS — Z1389 Encounter for screening for other disorder: Secondary | ICD-10-CM | POA: Diagnosis not present

## 2016-08-12 DIAGNOSIS — Z23 Encounter for immunization: Secondary | ICD-10-CM | POA: Diagnosis not present

## 2016-08-26 DIAGNOSIS — M810 Age-related osteoporosis without current pathological fracture: Secondary | ICD-10-CM | POA: Diagnosis not present

## 2016-10-29 ENCOUNTER — Emergency Department (HOSPITAL_COMMUNITY): Payer: Medicare Other

## 2016-10-29 ENCOUNTER — Encounter (HOSPITAL_COMMUNITY): Payer: Self-pay | Admitting: Emergency Medicine

## 2016-10-29 ENCOUNTER — Inpatient Hospital Stay (HOSPITAL_COMMUNITY)
Admission: EM | Admit: 2016-10-29 | Discharge: 2016-10-31 | DRG: 312 | Disposition: A | Discharge status: Home / Self Care | Payer: Medicare Other | Attending: Cardiology | Admitting: Cardiology

## 2016-10-29 DIAGNOSIS — Z823 Family history of stroke: Secondary | ICD-10-CM

## 2016-10-29 DIAGNOSIS — R42 Dizziness and giddiness: Secondary | ICD-10-CM | POA: Diagnosis not present

## 2016-10-29 DIAGNOSIS — I493 Ventricular premature depolarization: Secondary | ICD-10-CM | POA: Diagnosis not present

## 2016-10-29 DIAGNOSIS — I499 Cardiac arrhythmia, unspecified: Secondary | ICD-10-CM

## 2016-10-29 DIAGNOSIS — R112 Nausea with vomiting, unspecified: Secondary | ICD-10-CM | POA: Diagnosis not present

## 2016-10-29 DIAGNOSIS — I498 Other specified cardiac arrhythmias: Secondary | ICD-10-CM | POA: Diagnosis present

## 2016-10-29 DIAGNOSIS — Z96652 Presence of left artificial knee joint: Secondary | ICD-10-CM | POA: Diagnosis present

## 2016-10-29 DIAGNOSIS — R55 Syncope and collapse: Principal | ICD-10-CM | POA: Diagnosis present

## 2016-10-29 DIAGNOSIS — R008 Other abnormalities of heart beat: Secondary | ICD-10-CM | POA: Diagnosis present

## 2016-10-29 DIAGNOSIS — R404 Transient alteration of awareness: Secondary | ICD-10-CM | POA: Diagnosis not present

## 2016-10-29 DIAGNOSIS — Z8261 Family history of arthritis: Secondary | ICD-10-CM

## 2016-10-29 DIAGNOSIS — E7800 Pure hypercholesterolemia, unspecified: Secondary | ICD-10-CM | POA: Diagnosis not present

## 2016-10-29 HISTORY — DX: Scarlet fever, uncomplicated: A38.9

## 2016-10-29 LAB — COMPREHENSIVE METABOLIC PANEL
ALT: 12 U/L — AB (ref 14–54)
AST: 20 U/L (ref 15–41)
Albumin: 3.5 g/dL (ref 3.5–5.0)
Alkaline Phosphatase: 38 U/L (ref 38–126)
Anion gap: 9 (ref 5–15)
BILIRUBIN TOTAL: 0.6 mg/dL (ref 0.3–1.2)
BUN: 21 mg/dL — AB (ref 6–20)
CALCIUM: 9 mg/dL (ref 8.9–10.3)
CO2: 26 mmol/L (ref 22–32)
CREATININE: 0.9 mg/dL (ref 0.44–1.00)
Chloride: 106 mmol/L (ref 101–111)
GFR, EST NON AFRICAN AMERICAN: 56 mL/min — AB (ref >60)
Glucose, Bld: 114 mg/dL — ABNORMAL HIGH (ref 65–99)
Potassium: 4.1 mmol/L (ref 3.5–5.1)
Sodium: 141 mmol/L (ref 135–145)
TOTAL PROTEIN: 5.8 g/dL — AB (ref 6.5–8.1)

## 2016-10-29 LAB — CBC WITH DIFFERENTIAL/PLATELET
BASOS ABS: 0 10*3/uL (ref 0.0–0.1)
Basophils Relative: 1 %
EOS PCT: 1 %
Eosinophils Absolute: 0 10*3/uL (ref 0.0–0.7)
HEMATOCRIT: 35.7 % — AB (ref 36.0–46.0)
Hemoglobin: 12.1 g/dL (ref 12.0–15.0)
LYMPHS ABS: 1.3 10*3/uL (ref 0.7–4.0)
LYMPHS PCT: 24 %
MCH: 30.7 pg (ref 26.0–34.0)
MCHC: 33.9 g/dL (ref 30.0–36.0)
MCV: 90.6 fL (ref 78.0–100.0)
MONO ABS: 0.2 10*3/uL (ref 0.1–1.0)
Monocytes Relative: 4 %
NEUTROS ABS: 3.7 10*3/uL (ref 1.7–7.7)
Neutrophils Relative %: 70 %
Platelets: 193 10*3/uL (ref 150–400)
RBC: 3.94 MIL/uL (ref 3.87–5.11)
RDW: 13.2 % (ref 11.5–15.5)
WBC: 5.3 10*3/uL (ref 4.0–10.5)

## 2016-10-29 LAB — TROPONIN I

## 2016-10-29 LAB — MAGNESIUM: MAGNESIUM: 2 mg/dL (ref 1.7–2.4)

## 2016-10-29 LAB — TSH: TSH: 1.888 u[IU]/mL (ref 0.350–4.500)

## 2016-10-29 MED ORDER — ENOXAPARIN SODIUM 40 MG/0.4ML ~~LOC~~ SOLN: 40.0000 mg | SUBCUTANEOUS | Status: DC

## 2016-10-29 MED ORDER — SODIUM CHLORIDE 0.9 % IV SOLN: 250.0000 mL | INTRAVENOUS | Status: DC | PRN

## 2016-10-29 MED ORDER — SODIUM CHLORIDE 0.9% FLUSH: 3.0000 mL | Freq: Two times a day (BID) | INTRAVENOUS | Status: DC

## 2016-10-29 MED ORDER — ONDANSETRON HCL 4 MG/2ML IJ SOLN: 4.0000 mg | Freq: Four times a day (QID) | INTRAMUSCULAR | Status: DC | PRN

## 2016-10-29 MED ORDER — ONDANSETRON HCL 4 MG/2ML IJ SOLN
4.0000 mg | Freq: Once | INTRAMUSCULAR | Status: AC
  Administered 2016-10-29: 4 mg via INTRAVENOUS
  Filled 2016-10-29: qty 2

## 2016-10-29 MED ORDER — ASPIRIN 325 MG PO TABS
325.0000 mg | ORAL_TABLET | Freq: Once | ORAL | Status: AC
  Administered 2016-10-29: 325 mg via ORAL
  Filled 2016-10-29: qty 1

## 2016-10-29 MED ORDER — LACTATED RINGERS IV SOLN
INTRAVENOUS | Status: DC
  Administered 2016-10-29 – 2016-10-31 (×4): via INTRAVENOUS

## 2016-10-29 MED ORDER — ZOLPIDEM TARTRATE 5 MG PO TABS: 5.0000 mg | ORAL_TABLET | Freq: Every evening | ORAL | Status: DC | PRN

## 2016-10-29 MED ORDER — ACETAMINOPHEN 325 MG PO TABS
650.0000 mg | ORAL_TABLET | ORAL | Status: DC | PRN
Start: 2016-10-29 — End: 2016-10-31

## 2016-10-29 MED ORDER — ENOXAPARIN SODIUM 30 MG/0.3ML ~~LOC~~ SOLN
30.0000 mg | SUBCUTANEOUS | Status: DC
Start: 2016-10-29 — End: 2016-10-31
  Administered 2016-10-29 – 2016-10-30 (×2): 30 mg via SUBCUTANEOUS
  Filled 2016-10-29 (×2): qty 0.3

## 2016-10-29 MED ORDER — SODIUM CHLORIDE 0.9% FLUSH: 3.0000 mL | INTRAVENOUS | Status: DC | PRN

## 2016-10-29 MED ORDER — NITROGLYCERIN 0.4 MG SL SUBL: 0.4000 mg | SUBLINGUAL_TABLET | SUBLINGUAL | Status: DC | PRN

## 2016-10-29 MED ORDER — ALPRAZOLAM 0.25 MG PO TABS: 0.2500 mg | ORAL_TABLET | Freq: Two times a day (BID) | ORAL | Status: DC | PRN

## 2016-10-29 NOTE — ED Notes (Signed)
Pt returned to room and placed back on monitor.  

## 2016-10-29 NOTE — H&P (Signed)
CARDIOLOGY HISTORY AND PHYSICAL   Patient ID: Sheila Parrish MRN: [id number] DOB/AGE: [birth year] 82 y.o.  Admit date: 10/29/2016  Primary Physician   Irven Shelling, MD Primary Cardiologist   New Reason for Consultation   Syncope, arrhythmia Requesting MD: Dr Dayna Barker  [id number] Sheila Parrish is a 82 y.o. year old female with a history of HLD, scarlet fever, no hx CAD.   This week she has had some problems she never had before. She would feel light-headed when she got out of bed in the morning.  She would get light-headed at times, more consistently with position changes, but also at rest. However, the symptoms upon first getting out of bed were the worst. The symptoms would resolve in a few minutes. She did not fall or lose consciousness  This week, she also was having intermittent sharp pains above her R eye. She had these multiple times, they did not last that long. No visual problems, no weakness or sensory problems. No sinus symptoms or URI symptoms.  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 She felt weak, became a little SOB and very nauseated. She sat down at a table to keep from falling and put her head down. A police officer there, assisted her and called EMS.  She is clear that she never completely lost consciousness. She could hear and answer questions, but felt too weak to get up. She did not fall. Her husband was there and when he was summoned, she was answering questions.   She had not eaten yet today, she had orange juice for breakfast and no lunch.   Upon EMS arrival, she was in bigeminy, VS not available. She did not have any chest pain, no palpitations or awareness of heart skips or irregular HR.   She has never had anything like this before. She is generally busy around the house and with volunteer work without difficulty. She has never had chest pain, no CHF symptoms. No recent illnesses at all.    Past  Medical History:  Diagnosis Date  . High cholesterol   . Scarlet fever 1936   Hospitalized for a month     Past Surgical History:  Procedure Laterality Date  . ABDOMINAL HYSTERECTOMY    . TOTAL KNEE ARTHROPLASTY Left 2010    Allergies  Allergen Reactions  . Alendronate     Difficulty swallowing  . Atorvastatin     Leg cramping  . Azithromycin   . Bactrim [Sulfamethoxazole-Trimethoprim] Other (See Comments)    Reaction unknown  . Macrodantin [Nitrofurantoin Macrocrystal]   . Simvastatin     Leg cramping  . Sulfa Antibiotics Other (See Comments)    Reaction unknown   I have reviewed the patient's current medications  . lactated ringers 75 mL/hr at 10/29/16 0902   Prior to Admission medications   Medication Sig Start Date End Date Taking? Authorizing Provider  Calcium Citrate-Vitamin D (CITRACAL + D PO) Take 2 tablets by mouth 2 (two) times daily. Take 2 tablets after breakfast and 2 tablets after lunch   Yes Historical Provider, MD  Multiple Vitamin (MULTIVITAMIN WITH MINERALS) TABS Take 1 tablet by mouth daily.   Yes Historical Provider, MD     Social History   Social History  . Marital status: Married    Spouse name: Sheila Parrish  . Number of children: Sheila Parrish  . Years of education: Sheila Parrish   Occupational History  . Retired  Social History Main Topics  . Smoking status: Never Smoker  . Smokeless tobacco: Never Used  . Alcohol use No  . Drug use: No  . Sexual activity: Not on file   Other Topics Concern  . Not on file   Social History Narrative   Lives in [place] with Husband    Family Status  Relation Status  . Mother Deceased  . Father Deceased  . Neg Hx    Family History  Problem Relation Age of Onset  . Arthritis Mother     Died at 94  . Stroke Father 61    Died at age 6  . CAD Neg Hx      ROS:  Full 14 point review of systems complete and found to be negative unless listed above.  Physical Exam: Blood pressure 118/56, pulse 65, temperature 97.6  F (36.4 C), temperature source Oral, resp. rate 22, SpO2 97 %.  General: Well developed, well nourished, female in no acute distress Head: Eyes PERRLA, No xanthomas.   Normocephalic and atraumatic, oropharynx without edema or exudate. Dentition: fair Lungs: clear bilaterally Heart: HRRR S1 S2, no rub/gallop, no sig murmur. pulses are 2+ all 4 extrem.   Neck: No carotid bruits. No lymphadenopathy.  JVD not elevated Abdomen: Bowel sounds present, abdomen soft and non-tender without masses or hernias noted. Msk:  No spine or cva tenderness. No weakness, no joint deformities or effusions. Extremities: No clubbing or cyanosis. No edema.  Neuro: Alert and oriented X 3. No focal deficits noted. Psych:  Good affect, responds appropriately Skin: No rashes or lesions noted.  Labs:   Lab Results  Component Value Date   WBC 5.3 10/29/2016   HGB 12.1 10/29/2016   HCT 35.7 (L) 10/29/2016   MCV 90.6 10/29/2016   PLT 193 10/29/2016     Recent Labs Lab 10/29/16 0915  NA 141  K 4.1  CL 106  CO2 26  BUN 21*  CREATININE 0.90  CALCIUM 9.0  PROT 5.8*  BILITOT 0.6  ALKPHOS 38  ALT 12*  AST 20  GLUCOSE 114*  ALBUMIN 3.5   Magnesium  Date Value Ref Range Status  10/29/2016 2.0 1.7 - 2.4 mg/dL Final    Recent Labs  10/29/16 0915  TROPONINI <0.03    Echo: Sheila Parrish  ECG:  11/17 SR, ventricular bigeminy; HR 71  Cath: Sheila Parrish  Radiology:  Dg Chest 2 View Result Date: 10/29/2016 CLINICAL DATA:  Syncopal episode today. EXAM: CHEST  2 VIEW COMPARISON:  04/20/2014; 02/21/2013 FINDINGS: Grossly unchanged cardiac silhouette and mediastinal contours. There is persistent thickening of the right paratracheal stripe, presumably secondary to prominent vasculature. No focal airspace opacities. No pleural effusion or pneumothorax. No evidence of edema. No acute osseus abnormalities. IMPRESSION: No acute cardiopulmonary disease. Electronically Signed   By: Sandi Mariscal M.D.   On: 10/29/2016 12:12   Ct  Head Wo Contrast Result Date: 10/29/2016 CLINICAL DATA:  Nausea without vomiting. Evaluate for bleed or stroke. EXAM: CT HEAD WITHOUT CONTRAST TECHNIQUE: Contiguous axial images were obtained from the base of the skull through the vertex without intravenous contrast. COMPARISON:  None. FINDINGS: Brain: Mild cerebral atrophy. Low-density in the periventricular white matter suggesting chronic changes. No evidence for acute hemorrhage, mass lesion, midline shift, hydrocephalus or large infarct. Vascular: No hyperdense vessel or unexpected calcification. Skull: Normal. Negative for fracture or focal lesion. Sinuses/Orbits: No acute finding. Other: None. IMPRESSION: No acute intracranial abnormality. Atrophy and evidence for chronic small vessel ischemic changes. Electronically Signed  By: Markus Daft M.D.   On: 10/29/2016 10:04    ASSESSMENT AND PLAN:   The patient was seen today by Dr Radford Pax, the patient evaluated and the data reviewed.   Principal Problem: 1.  Near syncope - ck orthostatics>>they were negative - continue to monitor for sx - if has more bigeminy, ck BP during this with orthostatics to see if that was the cause. - could also have been vasovagal.  Active Problems: 2.  Orthostatic dizziness - see above  3.  Ventricular bigeminy - pt denies any awareness of this when it was happening. - however, she was extremely nauseated at the time. - K+ 4.1 & Mg 2.0, no supp needed - will ck TSH - keep on telemetry overnight to see if effective HR drops and that is causing near-syncope - She may need outpt monitor as well.   I will write admit orders after MD finalizes plan.  SignedRosaria Ferries, PA-C 10/29/2016 2:56 PM Beeper [id number]  Co-Sign MD

## 2016-10-29 NOTE — ED Provider Notes (Signed)
[facility] DEPT Provider Note   CSN: [id number] Arrival date & time: 10/29/16  0827     History   Chief Complaint Chief Complaint  Patient presents with  . Loss of Consciousness    HPI Sheila Parrish is a 82 y.o. female.  This is an 82 year old female without any significant past medical history the presents to the emergency department with syncope. Patient states that she was working at the [employer] when she started feeling significantly lightheaded and nauseated so she sat down in a chair and subsequently syncopized so a Engineer, structural went to see her and she e EMS was called. On EMS arrival patient was pale, cool, diaphoretic and did not appear well while clutching her chest. She is going in and out of bigeminy and route here. No interventions were taken. Blood pressure and rest of vital signs were normal. Time my evaluation the patient is having no chest pain still feels any nausea and some type of abnormality in epigastric area. No recent illnesses, history of the same or other symptoms. No other exacerbating or relieving factors.      Past Medical History:  Diagnosis Date  . High cholesterol   . Scarlet fever 1936   Hospitalized for a month    Patient Active Problem List   Diagnosis Date Noted  . Near syncope 10/29/2016  . Orthostatic dizziness 10/29/2016  . Ventricular bigeminy 10/29/2016  . Back soft tissue injury 05/30/2013    Past Surgical History:  Procedure Laterality Date  . ABDOMINAL HYSTERECTOMY    . TOTAL KNEE ARTHROPLASTY Left 2010    OB History    No data available       Home Medications    Prior to Admission medications   Medication Sig Start Date End Date Taking? Authorizing Provider  Calcium Citrate-Vitamin D (CITRACAL + D PO) Take 2 tablets by mouth 2 (two) times daily. Take 2 tablets after breakfast and 2 tablets after lunch   Yes Historical Provider, MD  Multiple Vitamin (MULTIVITAMIN WITH MINERALS) TABS Take 1 tablet by mouth  daily.   Yes Historical Provider, MD    Family History Family History  Problem Relation Age of Onset  . Arthritis Mother     Died at 57  . Stroke Father 88    Died at age 45  . CAD Neg Hx     Social History Social History  Substance Use Topics  . Smoking status: Never Smoker  . Smokeless tobacco: Never Used  . Alcohol use No     Allergies   Alendronate; Atorvastatin; Azithromycin; Bactrim [sulfamethoxazole-trimethoprim]; Macrodantin [nitrofurantoin macrocrystal]; Simvastatin; and Sulfa antibiotics   Review of Systems Review of Systems  All other systems reviewed and are negative.    Physical Exam Updated Vital Signs BP 118/56   Pulse 65   Temp 97.6 F (36.4 C) (Oral)   Resp 22   SpO2 97%   Physical Exam  Constitutional: She is oriented to person, place, and time. She appears well-developed and well-nourished.  HENT:  Head: Normocephalic and atraumatic.  Eyes: Conjunctivae and EOM are normal.  Neck: Normal range of motion.  Cardiovascular: Normal rate.  An irregularly irregular rhythm present. Exam reveals no gallop and no friction rub.   No murmur heard. Pulmonary/Chest: Effort normal. No stridor. No respiratory distress. She exhibits no tenderness.  Abdominal: Soft. She exhibits no distension.  Neurological: She is alert and oriented to person, place, and time.  No altered mental status, able to give full seemingly  accurate history.  Face is symmetric, EOM's intact, pupils equal and reactive, vision intact, tongue and uvula midline without deviation Upper and Lower extremity motor 5/5, intact pain perception in distal extremities, 2+ reflexes in biceps, patella and achilles tendons. Finger to nose normal, heel to shin normal.   Skin: Skin is warm and dry. Capillary refill takes less than 2 seconds.  Nursing note and vitals reviewed.    ED Treatments / Results  Labs (all labs ordered are listed, but only abnormal results are displayed) Labs Reviewed    CBC WITH DIFFERENTIAL/PLATELET - Abnormal; Notable for the following:       Result Value   HCT 35.7 (*)    All other components within normal limits  COMPREHENSIVE METABOLIC PANEL - Abnormal; Notable for the following:    Glucose, Bld 114 (*)    BUN 21 (*)    Total Protein 5.8 (*)    ALT 12 (*)    GFR calc non Af Amer 56 (*)    All other components within normal limits  TROPONIN I  MAGNESIUM  TSH    EKG  EKG Interpretation  Date/Time:  Friday October 29 2016 09:00:06 EST Ventricular Rate:  71 PR Interval:    QRS Duration: 90 QT Interval:  438 QTC Calculation: 379 R Axis:   76 Text Interpretation:  Sinus rhythm Ventricular bigeminy Confirmed by Saliou Barnier MD, Corene Cornea [phone]) on 10/29/2016 10:01:57 AM       Radiology Dg Chest 2 View  Result Date: 10/29/2016 CLINICAL DATA:  Syncopal episode today. EXAM: CHEST  2 VIEW COMPARISON:  04/20/2014; 02/21/2013 FINDINGS: Grossly unchanged cardiac silhouette and mediastinal contours. There is persistent thickening of the right paratracheal stripe, presumably secondary to prominent vasculature. No focal airspace opacities. No pleural effusion or pneumothorax. No evidence of edema. No acute osseus abnormalities. IMPRESSION: No acute cardiopulmonary disease. Electronically Signed   By: Sandi Mariscal M.D.   On: 10/29/2016 12:12   Ct Head Wo Contrast  Result Date: 10/29/2016 CLINICAL DATA:  Nausea without vomiting. Evaluate for bleed or stroke. EXAM: CT HEAD WITHOUT CONTRAST TECHNIQUE: Contiguous axial images were obtained from the base of the skull through the vertex without intravenous contrast. COMPARISON:  None. FINDINGS: Brain: Mild cerebral atrophy. Low-density in the periventricular white matter suggesting chronic changes. No evidence for acute hemorrhage, mass lesion, midline shift, hydrocephalus or large infarct. Vascular: No hyperdense vessel or unexpected calcification. Skull: Normal. Negative for fracture or focal lesion. Sinuses/Orbits:  No acute finding. Other: None. IMPRESSION: No acute intracranial abnormality. Atrophy and evidence for chronic small vessel ischemic changes. Electronically Signed   By: Markus Daft M.D.   On: 10/29/2016 10:04    Procedures Procedures (including critical care time)  Medications Ordered in ED Medications  lactated ringers infusion ( Intravenous New Bag/Given 10/29/16 0902)  aspirin tablet 325 mg (325 mg Oral Given 10/29/16 0856)  ondansetron (ZOFRAN) injection 4 mg (4 mg Intravenous Given 10/29/16 0856)     Initial Impression / Assessment and Plan / ED Course  I have reviewed the triage vital signs and the nursing notes.  Pertinent labs & imaging results that were available during my care of the patient were reviewed by me and considered in my medical decision making (see chart for details).  Clinical Course     She had bigeminy and appears unwell on arrival here. Concern that her frequent PVCs are not perfusing as when she has them she has an irregular pulse. Will check cardiac labs/cardiac monitor and consult  cardiology for admission.  Final Clinical Impressions(s) / ED Diagnoses   Final diagnoses:  Syncope, unspecified syncope type    New Prescriptions New Prescriptions   No medications on file     Merrily Pew, MD 10/29/16 1537

## 2016-10-29 NOTE — ED Notes (Signed)
Cardiology at bedside.

## 2016-10-29 NOTE — ED Notes (Signed)
XR called about pt. Waiting for imagining

## 2016-10-29 NOTE — ED Notes (Signed)
Pt. O2 sats dipping into 80s and low 90s. Pt placed on 2L [place] will continue to monitor.

## 2016-10-29 NOTE — ED Notes (Signed)
Patient transported to CT 

## 2016-10-29 NOTE — ED Triage Notes (Signed)
Pt to ER BIB [facility] for evaluation of syncopal episode. Pt works as Psychologist, occupational at [employer] in [employer]. This morning while working patient had sudden onset of generalized weakness, dizziness, and nausea. Patient proceeded to sit down and had a witnessed syncopal episode where patient was lowered to ground by bystanders. No fall or trauma. Upon EMS arrival patient was found to be pale and diaphoretic, 12 lead showing intermittent runs of PVC's/Bigeminy. HR 60's, BP 116/66, CBG 98. Pt reports she did eat breakfast this morning as usual. No medical hx per patient.

## 2016-10-30 ENCOUNTER — Observation Stay (HOSPITAL_BASED_OUTPATIENT_CLINIC_OR_DEPARTMENT_OTHER): Payer: Medicare Other

## 2016-10-30 DIAGNOSIS — I493 Ventricular premature depolarization: Secondary | ICD-10-CM | POA: Diagnosis not present

## 2016-10-30 DIAGNOSIS — I499 Cardiac arrhythmia, unspecified: Secondary | ICD-10-CM | POA: Diagnosis not present

## 2016-10-30 DIAGNOSIS — R008 Other abnormalities of heart beat: Secondary | ICD-10-CM | POA: Diagnosis not present

## 2016-10-30 DIAGNOSIS — R42 Dizziness and giddiness: Secondary | ICD-10-CM | POA: Diagnosis not present

## 2016-10-30 DIAGNOSIS — R9431 Abnormal electrocardiogram [ECG] [EKG]: Secondary | ICD-10-CM | POA: Diagnosis not present

## 2016-10-30 DIAGNOSIS — Z96652 Presence of left artificial knee joint: Secondary | ICD-10-CM | POA: Diagnosis not present

## 2016-10-30 DIAGNOSIS — Z823 Family history of stroke: Secondary | ICD-10-CM | POA: Diagnosis not present

## 2016-10-30 DIAGNOSIS — E7800 Pure hypercholesterolemia, unspecified: Secondary | ICD-10-CM | POA: Diagnosis not present

## 2016-10-30 DIAGNOSIS — Z8261 Family history of arthritis: Secondary | ICD-10-CM | POA: Diagnosis not present

## 2016-10-30 DIAGNOSIS — R55 Syncope and collapse: Secondary | ICD-10-CM | POA: Diagnosis not present

## 2016-10-30 LAB — COMPREHENSIVE METABOLIC PANEL
ALBUMIN: 3.1 g/dL — AB (ref 3.5–5.0)
ALK PHOS: 35 U/L — AB (ref 38–126)
ALT: 10 U/L — ABNORMAL LOW (ref 14–54)
ANION GAP: 8 (ref 5–15)
AST: 17 U/L (ref 15–41)
BUN: 18 mg/dL (ref 6–20)
CALCIUM: 8.4 mg/dL — AB (ref 8.9–10.3)
CO2: 24 mmol/L (ref 22–32)
Chloride: 110 mmol/L (ref 101–111)
Creatinine, Ser: 0.84 mg/dL (ref 0.44–1.00)
GFR calc non Af Amer: >60 mL/min (ref >60)
GLUCOSE: 76 mg/dL (ref 65–99)
POTASSIUM: 3.8 mmol/L (ref 3.5–5.1)
SODIUM: 142 mmol/L (ref 135–145)
Total Bilirubin: 0.5 mg/dL (ref 0.3–1.2)
Total Protein: 5.2 g/dL — ABNORMAL LOW (ref 6.5–8.1)

## 2016-10-30 LAB — ECHOCARDIOGRAM COMPLETE
HEIGHTINCHES: 62 in
Weight: 1755.2 oz

## 2016-10-30 LAB — TROPONIN I

## 2016-10-30 MED ORDER — METOPROLOL TARTRATE 12.5 MG HALF TABLET
12.5000 mg | ORAL_TABLET | Freq: Two times a day (BID) | ORAL | Status: DC
  Administered 2016-10-30 – 2016-10-31 (×3): 12.5 mg via ORAL
  Filled 2016-10-30 (×3): qty 1

## 2016-10-30 NOTE — Progress Notes (Signed)
  Patient's echocardiogram is normal.  Per Dr. Radford Pax, patient could be DC'd if echo is normal. Discussed with patient's daughter. Given late hour, patient prefers to stay until the AM. Will see her first thing in the AM and plan DC to home 10/31/16. Richardson Dopp, PA-C   10/30/2016 5:45 PM

## 2016-10-30 NOTE — Progress Notes (Signed)
  Echocardiogram 2D Echocardiogram has been performed.  Jennette Dubin 10/30/2016, 2:49 PM

## 2016-10-30 NOTE — Progress Notes (Addendum)
Patient Name: Sheila Parrish Date of Encounter: 10/30/2016  Primary Cardiologist: None  Hospital Problem List     Principal Problem:   Near syncope Active Problems:   Orthostatic dizziness   Ventricular bigeminy     Subjective   No further dizzy spells  Inpatient Medications    Scheduled Meds: . enoxaparin (LOVENOX) injection  30 mg Subcutaneous Q24H  . sodium chloride flush  3 mL Intravenous Q12H   Continuous Infusions: . lactated ringers 75 mL/hr at 10/30/16 1251   PRN Meds: sodium chloride, acetaminophen, ALPRAZolam, nitroGLYCERIN, ondansetron (ZOFRAN) IV, sodium chloride flush, zolpidem   Vital Signs    Vitals:   10/29/16 2026 10/30/16 0300 10/30/16 0900 10/30/16 1255  BP: (!) 109/45 (!) 120/49 (!) 110/41 (!) 120/54  Pulse: 71 65 72 67  Resp: 15 16 18 17   Temp: 98.4 F (36.9 C) 98.3 F (36.8 C) 98.3 F (36.8 C) 98.1 F (36.7 C)  TempSrc: Oral Oral Oral Oral  SpO2: 94%  95% 100%  Weight:  109 lb 11.2 oz (49.8 kg)    Height:        Intake/Output Summary (Last 24 hours) at 10/30/16 1259 Last data filed at 10/30/16 0900  Gross per 24 hour  Intake           963.75 ml  Output              300 ml  Net           663.75 ml   Filed Weights   10/29/16 1646 10/30/16 0300  Weight: 110 lb (49.9 kg) 109 lb 11.2 oz (49.8 kg)    Physical Exam    GEN: Well nourished, well developed, in no acute distress.  HEENT: Grossly normal.  Neck: Supple, no JVD, carotid bruits, or masses. Cardiac: RRR, no murmurs, rubs, or gallops. No clubbing, cyanosis, edema.  Radials/DP/PT 2+ and equal bilaterally.  Respiratory:  Respirations regular and unlabored, clear to auscultation bilaterally. GI: Soft, nontender, nondistended, BS + x 4. MS: no deformity or atrophy. Skin: warm and dry, no rash. Neuro:  Strength and sensation are intact. Psych: AAOx3.  Normal affect.  Labs    CBC  Recent Labs  10/29/16 0915  WBC 5.3  NEUTROABS 3.7  HGB 12.1  HCT 35.7*  MCV 90.6    PLT 0000000   Basic Metabolic Panel  Recent Labs  10/29/16 0915 10/30/16 0317  NA 141 142  K 4.1 3.8  CL 106 110  CO2 26 24  GLUCOSE 114* 76  BUN 21* 18  CREATININE 0.90 0.84  CALCIUM 9.0 8.4*  MG 2.0  --    Liver Function Tests  Recent Labs  10/29/16 0915 10/30/16 0317  AST 20 17  ALT 12* 10*  ALKPHOS 38 35*  BILITOT 0.6 0.5  PROT 5.8* 5.2*  ALBUMIN 3.5 3.1*   No results for input(s): LIPASE, AMYLASE in the last 72 hours. Cardiac Enzymes  Recent Labs  10/29/16 1659 10/29/16 2214 10/30/16 0317  TROPONINI <0.03 <0.03 <0.03   BNP Invalid input(s): POCBNP D-Dimer No results for input(s): DDIMER in the last 72 hours. Hemoglobin A1C No results for input(s): HGBA1C in the last 72 hours. Fasting Lipid Panel No results for input(s): CHOL, HDL, LDLCALC, TRIG, CHOLHDL, LDLDIRECT in the last 72 hours. Thyroid Function Tests  Recent Labs  10/29/16 1416  TSH 1.888    Telemetry    NSR - Personally Reviewed  ECG    NSR - Personally Reviewed  Radiology  Dg Chest 2 View  Result Date: 10/29/2016 CLINICAL DATA:  Syncopal episode today. EXAM: CHEST  2 VIEW COMPARISON:  04/20/2014; 02/21/2013 FINDINGS: Grossly unchanged cardiac silhouette and mediastinal contours. There is persistent thickening of the right paratracheal stripe, presumably secondary to prominent vasculature. No focal airspace opacities. No pleural effusion or pneumothorax. No evidence of edema. No acute osseus abnormalities. IMPRESSION: No acute cardiopulmonary disease. Electronically Signed   By: Sandi Mariscal M.D.   On: 10/29/2016 12:12   Ct Head Wo Contrast  Result Date: 10/29/2016 CLINICAL DATA:  Nausea without vomiting. Evaluate for bleed or stroke. EXAM: CT HEAD WITHOUT CONTRAST TECHNIQUE: Contiguous axial images were obtained from the base of the skull through the vertex without intravenous contrast. COMPARISON:  None. FINDINGS: Brain: Mild cerebral atrophy. Low-density in the periventricular  white matter suggesting chronic changes. No evidence for acute hemorrhage, mass lesion, midline shift, hydrocephalus or large infarct. Vascular: No hyperdense vessel or unexpected calcification. Skull: Normal. Negative for fracture or focal lesion. Sinuses/Orbits: No acute finding. Other: None. IMPRESSION: No acute intracranial abnormality. Atrophy and evidence for chronic small vessel ischemic changes. Electronically Signed   By: Markus Daft M.D.   On: 10/29/2016 10:04    Cardiac Studies   Echo pending  Patient Profile     82 y.o. year old female with a history of HLD, scarlet fever, no hx CAD. This week she has had some problems she never had before. She would feel light-headed when she got out of bed in the morning.  She would get light-headed at times, more consistently with position changes, but also at rest. However, the symptoms upon first getting out of bed were the worst. The symptoms would resolve in a few minutes. She did not fall or lose consciousness.  She also was having intermittent sharp pains above her R eye. She had these multiple times, they did not last that long. No visual problems, no weakness or sensory problems. No sinus symptoms or URI symptoms.  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 She felt weak, became a little SOB and very nauseated. She sat down at a table to keep from falling and put her head down. A police officer there, assisted her and called EMS.  She is clear that she never completely lost consciousness. She could hear and answer questions, but felt too weak to get up. She did not fall. Her husband was there and when he was summoned, she was answering questions.   Assessment & Plan    1.  Presyncope/Dizziness - orthostatics normal in ER.  CT of head showed Atrophy and evidence for chronic small vessel ischemic changes but no acute abnormality.  Troponin normal x 3 and labs otherwise unremarkable except for  low albumin.  No chest pain or SOB or palpitations. Her symptoms are mainly positional and occur when going from sitting to standing or standing for too long.  She was noted to have bigeminal PVCs on admission.  Suspect she may not be perfusing her PVCs so that effective HR is only 45bpm.  TSH and lytes normal. PVCs have resolved on  tele - 2D echo pending for LVF.  If normal then ok to discharge home with event monitor.   - TED hose compression stockings - HR is in the 70-80's so will add metoprolol 12.5mg  BID for suppression of PVCs  Signed, Fransico Him, MD  10/30/2016, 12:59 PM

## 2016-10-31 ENCOUNTER — Other Ambulatory Visit: Payer: Self-pay | Admitting: Physician Assistant

## 2016-10-31 DIAGNOSIS — R55 Syncope and collapse: Secondary | ICD-10-CM

## 2016-10-31 DIAGNOSIS — I493 Ventricular premature depolarization: Secondary | ICD-10-CM

## 2016-10-31 MED ORDER — METOPROLOL TARTRATE 25 MG PO TABS: 12.5000 mg | ORAL_TABLET | Freq: Two times a day (BID) | ORAL | 11 refills | Status: DC

## 2016-10-31 NOTE — Discharge Instructions (Signed)
Wear compression stockings every day - put on after getting up and take off before bed.  Remember to get up slowly after laying or sitting for a while.   You can also pump your calf muscles for a while before standing.  The Cardiology office will arrange a heart monitor and follow up with Dr. Fransico Him.

## 2016-10-31 NOTE — Progress Notes (Signed)
Patient in a stable condition, discharge education completed with patient and daughter at bedside, they verbalized understanding, patient belongings at bedside, iv removed, tele dc ccmd notified

## 2016-10-31 NOTE — Discharge Summary (Signed)
Discharge Summary    Patient ID: Sheila Parrish,  MRN: [id number], DOB/AGE: Nov 18, 1927 82 y.o.  Admit date: 10/29/2016 Discharge date: 10/31/2016  Primary Care Provider: Irven Shelling Primary Cardiologist: Dr. Fransico Him   Discharge Diagnoses    Principal Problem:   Near syncope Active Problems:   Orthostatic dizziness   Ventricular bigeminy   Allergies Allergies  Allergen Reactions  . Alendronate     Difficulty swallowing  . Atorvastatin     Leg cramping  . Azithromycin   . Bactrim [Sulfamethoxazole-Trimethoprim] Other (See Comments)    Reaction unknown  . Macrodantin [Nitrofurantoin Macrocrystal]   . Simvastatin     Leg cramping  . Sulfa Antibiotics Other (See Comments)    Reaction unknown    Diagnostic Studies/Procedures    Echo 10/30/16 - Left ventricle: The cavity size was normal. Wall thickness was   normal. Systolic function was normal. The estimated ejection   fraction was in the range of 55% to 60%. Wall motion was normal;   there were no regional wall motion abnormalities. - Aortic valve: There was mild regurgitation. - Mitral valve: There was mild regurgitation. Impressions: - Normal LV systolic function; probable mild diastolic dysfunction;   mild AI; mild MR; trace TR. _____________   History of Present Illness     Sheila Parrish is a 82 y.o. female with hx of HL, scarlet fever and no hx of CAD.  Prior to admission, she had been having symptoms of postural dizziness for about a week and ultimately presented to the hospital via EMS on 10/29/2016 after developing lightheadedness with assoc shortness of breath and nausea while cleaning table at [employer].  She did not have frank syncope.  Orthostatic VS in the ED were normal.  Head CT demonstrated no acute findings.  Cardiac enzymes were unremarkable.  Initial ECG demonstrated bigeminal PVCs.  Hospital Course     Consultants: None    She was observed overnight and noted  to have no further PVCs on Tele.  Symptoms were noted to mainly be positional with going from sitting to standing or after standing for a long time.  She was started on low dose Metoprolol Tartrate 12.5 mg bid to suppress PVCs (?if not perfusing PVCs led to symptoms).  She was placed on TED hose.  Echocardiogram was performed and demonstrated normal ejection fraction and normal wall motion.  She was evaluated by Dr. Fransico Him yesterday and felt to be ready for DC to home if her echocardiogram was normal.  However, the results of her echocardiogram returned late and the patient was not comfortable going home last night.   This AM, she is feeling better. She denies any further dizziness.  She denies chest pain or shortness of breath.  She is ready to go home.  She was also evaluated by Dr. Fransico Him this AM who felt she is stable for DC to home.  Plan will be to: 1. Arrange Event Monitor x 30 days 2. Wear TED hose/compression stockings. 3. Plan outpatient follow up in 4-6 weeks (after monitor) 4. Continue Metoprolol Tartrate 12.5 mg Twice daily   Exam today BP (!) 155/64 (BP Location: Left Arm)   Pulse 71   Temp 98 F (36.7 C) (Oral)   Resp 16   Ht 5\' 2"  (1.575 m)   Wt 109 lb 11.2 oz (49.8 kg)   SpO2 97%   BMI 20.06 kg/m   Gen - no acute distress Neck - No JVD  Heart - RRR, no murmurs Lungs - Clear to auscultation, no rales Abdomen - soft, non-tender Ext - No edema Psych - Normal affect Neuro - no focal deficits Skin - warm and dry  Tele - Today, personally reviewed, demonstrates NSR, occ PVCs/bigeminal pattern at times  _____________  Discharge Vitals Blood pressure (!) 155/64, pulse 71, temperature 98 F (36.7 C), temperature source Oral, resp. rate 16, height 5\' 2"  (1.575 m), weight 109 lb 11.2 oz (49.8 kg), SpO2 97 %.  Filed Weights   10/29/16 1646 10/30/16 0300  Weight: 110 lb (49.9 kg) 109 lb 11.2 oz (49.8 kg)    Labs & Radiologic Studies    CBC  Recent Labs   10/29/16 0915  WBC 5.3  NEUTROABS 3.7  HGB 12.1  HCT 35.7*  MCV 90.6  PLT 0000000   Basic Metabolic Panel  Recent Labs  10/29/16 0915 10/30/16 0317  NA 141 142  K 4.1 3.8  CL 106 110  CO2 26 24  GLUCOSE 114* 76  BUN 21* 18  CREATININE 0.90 0.84  CALCIUM 9.0 8.4*  MG 2.0  --    Liver Function Tests  Recent Labs  10/29/16 0915 10/30/16 0317  AST 20 17  ALT 12* 10*  ALKPHOS 38 35*  BILITOT 0.6 0.5  PROT 5.8* 5.2*  ALBUMIN 3.5 3.1*    Cardiac Enzymes  Recent Labs  10/29/16 1659 10/29/16 2214 10/30/16 0317  TROPONINI <0.03 <0.03 <0.03   Thyroid Function Tests  Recent Labs  10/29/16 1416  TSH 1.888   _____________   Dg Chest 2 View   Result Date: 10/29/2016 IMPRESSION: No acute cardiopulmonary disease. Electronically Signed   By: Sandi Mariscal M.D.   On: 10/29/2016 12:12    Ct Head Wo Contrast   Result Date: 10/29/2016 IMPRESSION: No acute intracranial abnormality. Atrophy and evidence for chronic small vessel ischemic changes. Electronically Signed   By: Markus Daft M.D.   On: 10/29/2016 10:04    Disposition   Pt is being discharged home today in good condition.  Follow-up Plans & Appointments    Follow-up Information    Fransico Him, MD Follow up in 4 week(s).   Specialty:  Cardiology Why:  The office will call you to arrange a follow up appointment with Dr. Radford Pax or a PA or NP after your heart monitor is complete. Contact information: [id number] [address] [phone]        [facility] Follow up.   Specialty:  Cardiology Why:  The office will call to arrange an event monitor (heart monitor) Contact information: [address], [place] [phone]         Discharge Instructions    Diet - low sodium heart healthy    Complete by:  As directed    Increase activity slowly    Complete by:  As directed    No wound care    Complete by:  As  directed       Discharge Medications   Current Discharge Medication List    START taking these medications   Details  metoprolol tartrate (LOPRESSOR) 25 MG tablet Take 0.5 tablets (12.5 mg total) by mouth 2 (two) times daily. Qty: 30 tablet, Refills: 11      CONTINUE these medications which have NOT CHANGED   Details  Calcium Citrate-Vitamin D (CITRACAL + D PO) Take 2 tablets by mouth 2 (two) times daily. Take 2 tablets  after breakfast and 2 tablets after lunch    Multiple Vitamin (MULTIVITAMIN WITH MINERALS) TABS Take 1 tablet by mouth daily.           Outstanding Labs/Studies   1. Event Monitor x 30 days.   Duration of Discharge Encounter   Greater than 30 minutes including physician time.  Signed, Richardson Dopp, PA-C  10/31/2016, 9:29 AM

## 2016-11-11 ENCOUNTER — Ambulatory Visit (INDEPENDENT_AMBULATORY_CARE_PROVIDER_SITE_OTHER): Payer: Medicare Other

## 2016-11-11 DIAGNOSIS — R55 Syncope and collapse: Secondary | ICD-10-CM

## 2016-11-11 DIAGNOSIS — I493 Ventricular premature depolarization: Secondary | ICD-10-CM

## 2016-11-13 ENCOUNTER — Other Ambulatory Visit: Payer: Self-pay | Admitting: Internal Medicine

## 2016-11-15 ENCOUNTER — Telehealth: Payer: Self-pay | Admitting: Physician Assistant

## 2016-11-15 NOTE — Telephone Encounter (Signed)
Pt had gotten an error message on her heart monitor, wanted to know what to do. She is asymptomatic. She has made sure all the wires are attached as they should be. Advised her to call the company in the morning to get it checked, do not worry as long as she feels ok.  Lenoard Aden 11/15/2016 7:47 PM [facility] [phone]

## 2016-12-23 ENCOUNTER — Ambulatory Visit (INDEPENDENT_AMBULATORY_CARE_PROVIDER_SITE_OTHER): Payer: Medicare Other | Admitting: Cardiology

## 2016-12-23 ENCOUNTER — Encounter: Payer: Self-pay | Admitting: Cardiology

## 2016-12-23 VITALS — BP 140/72 | HR 68 | Ht 62.0 in | Wt 111.0 lb

## 2016-12-23 DIAGNOSIS — I493 Ventricular premature depolarization: Secondary | ICD-10-CM | POA: Diagnosis not present

## 2016-12-23 NOTE — Patient Instructions (Signed)
Medication Instructions:  Your physician recommends that you continue on your current medications as directed. Please refer to the Current Medication list given to you today.  Labwork: NONE  Testing/Procedures: NONE  Follow-Up: Your physician wants you to follow-up in: 6 MONTHS  WITH  DR  TURNER   You will receive a reminder letter in the mail two months in advance. If you don't receive a letter, please call our office to schedule the follow-up appointment.  Any Other Special Instructions Will Be Listed Below (If Applicable).     If you need a refill on your cardiac medications before your next appointment, please call your pharmacy.   

## 2016-12-23 NOTE — Progress Notes (Signed)
12/23/2016 [place]   1927-11-11  [id number]  Primary Physician Irven Shelling, MD Primary Cardiologist: Dr. Radford Pax    Reason for Visit/CC: [facility] F/u, Diastolic HF  HPI:  Sheila Parrish is a 82 y.o. female with hx of HL, scarlet fever and no hx of CAD, who presents to clinic for post hospital f/u. She was admitted 10/29/16 for near syncope. Head CT and orthostatics were negative. She was noted to have PVCs/ ventricular bigeminy on telemetry. Cardiac enzymes were negative. 2D echo showed normal LVF. She was placed on a low dose BB, metoprolol 12. 5 mg BID,and was monitored overnight and had only occasional PVCs after initiation of BB. She was discharged home with a 30 day monitor. This showed NSR with avg HR of 73 bpm. She had one episode of atrial tach of 5 beats. No other arrhthymias.   She reports that she has done well. She denies any recurrent symptoms. No further syncope/ near syncope. No palpitations. She is tolerating BB well w/o side effects. HR and BP are both stable.     Current Meds  Medication Sig  . Calcium Citrate-Vitamin D (CITRACAL + D PO) Take 2 tablets by mouth 2 (two) times daily. Take 2 tablets after breakfast and 2 tablets after lunch  . metoprolol tartrate (LOPRESSOR) 25 MG tablet Take 0.5 tablets (12.5 mg total) by mouth 2 (two) times daily.  . Multiple Vitamin (MULTIVITAMIN WITH MINERALS) TABS Take 1 tablet by mouth daily.   Allergies  Allergen Reactions  . Alendronate     Difficulty swallowing  . Atorvastatin     Leg cramping  . Azithromycin   . Bactrim [Sulfamethoxazole-Trimethoprim] Other (See Comments)    Reaction unknown  . Macrodantin [Nitrofurantoin Macrocrystal]   . Simvastatin     Leg cramping  . Sulfa Antibiotics Other (See Comments)    Reaction unknown   Past Medical History:  Diagnosis Date  . High cholesterol   . Scarlet fever 1936   Hospitalized for a month   Family History  Problem Relation Age of Onset  .  Arthritis Mother     Died at 63  . Stroke Father 25    Died at age 81  . CAD Neg Hx    Past Surgical History:  Procedure Laterality Date  . ABDOMINAL HYSTERECTOMY    . TOTAL KNEE ARTHROPLASTY Left 2010   Social History   Social History  . Marital status: Married    Spouse name: N/A  . Number of children: N/A  . Years of education: N/A   Occupational History  . Retired    Social History Main Topics  . Smoking status: Never Smoker  . Smokeless tobacco: Never Used  . Alcohol use No  . Drug use: No  . Sexual activity: Not on file   Other Topics Concern  . Not on file   Social History Narrative   Lives in [place] with Husband     Review of Systems: General: negative for chills, fever, night sweats or weight changes.  Cardiovascular: negative for chest pain, dyspnea on exertion, edema, orthopnea, palpitations, paroxysmal nocturnal dyspnea or shortness of breath Dermatological: negative for rash Respiratory: negative for cough or wheezing Urologic: negative for hematuria Abdominal: negative for nausea, vomiting, diarrhea, bright red blood per rectum, melena, or hematemesis Neurologic: negative for visual changes, syncope, or dizziness All other systems reviewed and are otherwise negative except as noted above.   Physical Exam:  Blood pressure 140/72, pulse 68, height 5'  2" (1.575 m), weight 111 lb (50.3 kg).  General appearance: alert, cooperative and no distress Neck: no carotid bruit and no JVD Lungs: clear to auscultation bilaterally Heart: regular rate and rhythm, S1, S2 normal, no murmur, click, rub or gallop Extremities: extremities normal, atraumatic, no cyanosis or edema Pulses: 2+ and symmetric Skin: Skin color, texture, turgor normal. No rashes or lesions Neurologic: Grossly normal  EKG not performed.   ASSESSMENT AND PLAN:   1. PVCs/ ventricular bigeminy: resolved after initiation of BB, metoprolol 12.5 mg BID. 30 day monitor showed no significant  recurrence. She denies any symptoms since hospital discharge. VSS. Exam is benign. Continue metoprolol.   PLAN  F/u with Dr. Radford Pax in 6 months.   [place] President PA-C 12/23/2016 10:30 AM

## 2017-01-03 ENCOUNTER — Telehealth: Payer: Self-pay | Admitting: Cardiology

## 2017-01-03 NOTE — Telephone Encounter (Signed)
Reviewed monitor results. Patient was grateful for call.

## 2017-01-03 NOTE — Telephone Encounter (Signed)
New message    Returning you call about lab results

## 2017-02-24 DIAGNOSIS — M810 Age-related osteoporosis without current pathological fracture: Secondary | ICD-10-CM | POA: Diagnosis not present

## 2017-05-04 DIAGNOSIS — R079 Chest pain, unspecified: Secondary | ICD-10-CM | POA: Diagnosis not present

## 2017-05-10 DIAGNOSIS — L821 Other seborrheic keratosis: Secondary | ICD-10-CM | POA: Diagnosis not present

## 2017-05-10 DIAGNOSIS — D1801 Hemangioma of skin and subcutaneous tissue: Secondary | ICD-10-CM | POA: Diagnosis not present

## 2017-05-10 DIAGNOSIS — Z85828 Personal history of other malignant neoplasm of skin: Secondary | ICD-10-CM | POA: Diagnosis not present

## 2017-05-10 DIAGNOSIS — L814 Other melanin hyperpigmentation: Secondary | ICD-10-CM | POA: Diagnosis not present

## 2017-05-10 DIAGNOSIS — B351 Tinea unguium: Secondary | ICD-10-CM | POA: Diagnosis not present

## 2017-05-18 ENCOUNTER — Encounter (HOSPITAL_COMMUNITY): Payer: Medicare Other

## 2017-05-18 ENCOUNTER — Telehealth (HOSPITAL_COMMUNITY): Payer: Self-pay

## 2017-05-18 ENCOUNTER — Other Ambulatory Visit: Payer: Self-pay | Admitting: Geriatric Medicine

## 2017-05-18 DIAGNOSIS — R079 Chest pain, unspecified: Secondary | ICD-10-CM

## 2017-05-18 NOTE — Telephone Encounter (Signed)
Awaiting return call at this time. 

## 2017-05-19 ENCOUNTER — Ambulatory Visit (HOSPITAL_COMMUNITY)
Admission: RE | Admit: 2017-05-19 | Discharge: 2017-05-19 | Disposition: A | Discharge status: Home / Self Care | Payer: Medicare Other | Source: Ambulatory Visit | Attending: Cardiovascular Disease | Admitting: Cardiovascular Disease

## 2017-05-19 DIAGNOSIS — R079 Chest pain, unspecified: Secondary | ICD-10-CM | POA: Insufficient documentation

## 2017-05-19 LAB — MYOCARDIAL PERFUSION IMAGING
CHL CUP NUCLEAR SRS: 0
CHL CUP RESTING HR STRESS: 53 {beats}/min
CSEPPHR: 82 {beats}/min
LV dias vol: 58 mL (ref 46–106)
LV sys vol: 19 mL
SDS: 0
SSS: 0
TID: 1.13

## 2017-05-19 MED ORDER — TECHNETIUM TC 99M TETROFOSMIN IV KIT
29.5000 | PACK | Freq: Once | INTRAVENOUS | Status: AC | PRN
  Administered 2017-05-19: 29.5 via INTRAVENOUS
  Filled 2017-05-19: qty 30

## 2017-05-19 MED ORDER — REGADENOSON 0.4 MG/5ML IV SOLN
0.4000 mg | Freq: Once | INTRAVENOUS | Status: AC
  Administered 2017-05-19: 0.4 mg via INTRAVENOUS

## 2017-05-19 MED ORDER — TECHNETIUM TC 99M TETROFOSMIN IV KIT
9.6000 | PACK | Freq: Once | INTRAVENOUS | Status: AC | PRN
  Administered 2017-05-19: 9.6 via INTRAVENOUS
  Filled 2017-05-19: qty 10

## 2017-05-19 MED ORDER — AMINOPHYLLINE 25 MG/ML IV SOLN
75.0000 mg | Freq: Once | INTRAVENOUS | Status: AC
  Administered 2017-05-19: 75 mg via INTRAVENOUS

## 2017-06-03 DIAGNOSIS — R41841 Cognitive communication deficit: Secondary | ICD-10-CM | POA: Diagnosis not present

## 2017-06-03 DIAGNOSIS — M6281 Muscle weakness (generalized): Secondary | ICD-10-CM | POA: Diagnosis not present

## 2017-06-05 DIAGNOSIS — R41841 Cognitive communication deficit: Secondary | ICD-10-CM | POA: Diagnosis not present

## 2017-06-05 DIAGNOSIS — M6281 Muscle weakness (generalized): Secondary | ICD-10-CM | POA: Diagnosis not present

## 2017-06-06 DIAGNOSIS — R41841 Cognitive communication deficit: Secondary | ICD-10-CM | POA: Diagnosis not present

## 2017-06-06 DIAGNOSIS — M6281 Muscle weakness (generalized): Secondary | ICD-10-CM | POA: Diagnosis not present

## 2017-06-08 DIAGNOSIS — M6281 Muscle weakness (generalized): Secondary | ICD-10-CM | POA: Diagnosis not present

## 2017-06-08 DIAGNOSIS — R41841 Cognitive communication deficit: Secondary | ICD-10-CM | POA: Diagnosis not present

## 2017-06-09 DIAGNOSIS — Z1389 Encounter for screening for other disorder: Secondary | ICD-10-CM | POA: Diagnosis not present

## 2017-06-09 DIAGNOSIS — Z0000 Encounter for general adult medical examination without abnormal findings: Secondary | ICD-10-CM | POA: Diagnosis not present

## 2017-06-09 DIAGNOSIS — M810 Age-related osteoporosis without current pathological fracture: Secondary | ICD-10-CM | POA: Diagnosis not present

## 2017-06-09 DIAGNOSIS — F439 Reaction to severe stress, unspecified: Secondary | ICD-10-CM | POA: Diagnosis not present

## 2017-06-09 DIAGNOSIS — R079 Chest pain, unspecified: Secondary | ICD-10-CM | POA: Diagnosis not present

## 2017-06-10 DIAGNOSIS — R41841 Cognitive communication deficit: Secondary | ICD-10-CM | POA: Diagnosis not present

## 2017-06-10 DIAGNOSIS — M6281 Muscle weakness (generalized): Secondary | ICD-10-CM | POA: Diagnosis not present

## 2017-06-13 DIAGNOSIS — R41841 Cognitive communication deficit: Secondary | ICD-10-CM | POA: Diagnosis not present

## 2017-06-13 DIAGNOSIS — M6281 Muscle weakness (generalized): Secondary | ICD-10-CM | POA: Diagnosis not present

## 2017-06-16 DIAGNOSIS — R41841 Cognitive communication deficit: Secondary | ICD-10-CM | POA: Diagnosis not present

## 2017-06-16 DIAGNOSIS — M6281 Muscle weakness (generalized): Secondary | ICD-10-CM | POA: Diagnosis not present

## 2017-06-17 DIAGNOSIS — R41841 Cognitive communication deficit: Secondary | ICD-10-CM | POA: Diagnosis not present

## 2017-06-17 DIAGNOSIS — M6281 Muscle weakness (generalized): Secondary | ICD-10-CM | POA: Diagnosis not present

## 2017-06-20 DIAGNOSIS — R41841 Cognitive communication deficit: Secondary | ICD-10-CM | POA: Diagnosis not present

## 2017-06-20 DIAGNOSIS — M6281 Muscle weakness (generalized): Secondary | ICD-10-CM | POA: Diagnosis not present

## 2017-06-22 DIAGNOSIS — R41841 Cognitive communication deficit: Secondary | ICD-10-CM | POA: Diagnosis not present

## 2017-06-22 DIAGNOSIS — M6281 Muscle weakness (generalized): Secondary | ICD-10-CM | POA: Diagnosis not present

## 2017-06-23 DIAGNOSIS — R41841 Cognitive communication deficit: Secondary | ICD-10-CM | POA: Diagnosis not present

## 2017-06-23 DIAGNOSIS — M6281 Muscle weakness (generalized): Secondary | ICD-10-CM | POA: Diagnosis not present

## 2017-06-24 DIAGNOSIS — M6281 Muscle weakness (generalized): Secondary | ICD-10-CM | POA: Diagnosis not present

## 2017-06-24 DIAGNOSIS — R41841 Cognitive communication deficit: Secondary | ICD-10-CM | POA: Diagnosis not present

## 2017-06-27 DIAGNOSIS — M6281 Muscle weakness (generalized): Secondary | ICD-10-CM | POA: Diagnosis not present

## 2017-06-27 DIAGNOSIS — R41841 Cognitive communication deficit: Secondary | ICD-10-CM | POA: Diagnosis not present

## 2017-06-29 DIAGNOSIS — R41841 Cognitive communication deficit: Secondary | ICD-10-CM | POA: Diagnosis not present

## 2017-06-29 DIAGNOSIS — M6281 Muscle weakness (generalized): Secondary | ICD-10-CM | POA: Diagnosis not present

## 2017-07-01 DIAGNOSIS — R41841 Cognitive communication deficit: Secondary | ICD-10-CM | POA: Diagnosis not present

## 2017-07-01 DIAGNOSIS — M6281 Muscle weakness (generalized): Secondary | ICD-10-CM | POA: Diagnosis not present

## 2017-07-04 DIAGNOSIS — R41841 Cognitive communication deficit: Secondary | ICD-10-CM | POA: Diagnosis not present

## 2017-07-04 DIAGNOSIS — M6281 Muscle weakness (generalized): Secondary | ICD-10-CM | POA: Diagnosis not present

## 2017-07-06 DIAGNOSIS — M6281 Muscle weakness (generalized): Secondary | ICD-10-CM | POA: Diagnosis not present

## 2017-07-06 DIAGNOSIS — R41841 Cognitive communication deficit: Secondary | ICD-10-CM | POA: Diagnosis not present

## 2017-07-08 DIAGNOSIS — M6281 Muscle weakness (generalized): Secondary | ICD-10-CM | POA: Diagnosis not present

## 2017-07-08 DIAGNOSIS — R41841 Cognitive communication deficit: Secondary | ICD-10-CM | POA: Diagnosis not present

## 2017-07-08 DIAGNOSIS — M5489 Other dorsalgia: Secondary | ICD-10-CM | POA: Diagnosis not present

## 2017-07-11 DIAGNOSIS — R41841 Cognitive communication deficit: Secondary | ICD-10-CM | POA: Diagnosis not present

## 2017-07-11 DIAGNOSIS — M6281 Muscle weakness (generalized): Secondary | ICD-10-CM | POA: Diagnosis not present

## 2017-07-13 DIAGNOSIS — R41841 Cognitive communication deficit: Secondary | ICD-10-CM | POA: Diagnosis not present

## 2017-07-15 DIAGNOSIS — R41841 Cognitive communication deficit: Secondary | ICD-10-CM | POA: Diagnosis not present

## 2017-07-18 DIAGNOSIS — R41841 Cognitive communication deficit: Secondary | ICD-10-CM | POA: Diagnosis not present

## 2017-07-25 DIAGNOSIS — R41841 Cognitive communication deficit: Secondary | ICD-10-CM | POA: Diagnosis not present

## 2017-07-28 DIAGNOSIS — Z961 Presence of intraocular lens: Secondary | ICD-10-CM | POA: Diagnosis not present

## 2017-07-28 DIAGNOSIS — H524 Presbyopia: Secondary | ICD-10-CM | POA: Diagnosis not present

## 2017-08-01 DIAGNOSIS — R41841 Cognitive communication deficit: Secondary | ICD-10-CM | POA: Diagnosis not present

## 2017-08-03 DIAGNOSIS — R41841 Cognitive communication deficit: Secondary | ICD-10-CM | POA: Diagnosis not present

## 2017-08-10 DIAGNOSIS — R41841 Cognitive communication deficit: Secondary | ICD-10-CM | POA: Diagnosis not present

## 2017-08-17 DIAGNOSIS — R41841 Cognitive communication deficit: Secondary | ICD-10-CM | POA: Diagnosis not present

## 2017-08-23 DIAGNOSIS — R41841 Cognitive communication deficit: Secondary | ICD-10-CM | POA: Diagnosis not present

## 2017-08-30 DIAGNOSIS — M810 Age-related osteoporosis without current pathological fracture: Secondary | ICD-10-CM | POA: Diagnosis not present

## 2017-08-31 DIAGNOSIS — R41841 Cognitive communication deficit: Secondary | ICD-10-CM | POA: Diagnosis not present

## 2017-09-02 DIAGNOSIS — R41841 Cognitive communication deficit: Secondary | ICD-10-CM | POA: Diagnosis not present

## 2017-09-05 DIAGNOSIS — R41841 Cognitive communication deficit: Secondary | ICD-10-CM | POA: Diagnosis not present

## 2017-09-07 DIAGNOSIS — R41841 Cognitive communication deficit: Secondary | ICD-10-CM | POA: Diagnosis not present

## 2017-09-12 DIAGNOSIS — R41841 Cognitive communication deficit: Secondary | ICD-10-CM | POA: Diagnosis not present

## 2017-09-12 DIAGNOSIS — R2681 Unsteadiness on feet: Secondary | ICD-10-CM | POA: Diagnosis not present

## 2017-09-14 DIAGNOSIS — R41841 Cognitive communication deficit: Secondary | ICD-10-CM | POA: Diagnosis not present

## 2017-09-14 DIAGNOSIS — R2681 Unsteadiness on feet: Secondary | ICD-10-CM | POA: Diagnosis not present

## 2017-09-15 DIAGNOSIS — R2681 Unsteadiness on feet: Secondary | ICD-10-CM | POA: Diagnosis not present

## 2017-09-15 DIAGNOSIS — R41841 Cognitive communication deficit: Secondary | ICD-10-CM | POA: Diagnosis not present

## 2017-09-16 DIAGNOSIS — Z23 Encounter for immunization: Secondary | ICD-10-CM | POA: Diagnosis not present

## 2017-09-19 DIAGNOSIS — R2681 Unsteadiness on feet: Secondary | ICD-10-CM | POA: Diagnosis not present

## 2017-09-19 DIAGNOSIS — R41841 Cognitive communication deficit: Secondary | ICD-10-CM | POA: Diagnosis not present

## 2017-09-21 DIAGNOSIS — R2681 Unsteadiness on feet: Secondary | ICD-10-CM | POA: Diagnosis not present

## 2017-09-21 DIAGNOSIS — R41841 Cognitive communication deficit: Secondary | ICD-10-CM | POA: Diagnosis not present

## 2017-09-28 DIAGNOSIS — R41841 Cognitive communication deficit: Secondary | ICD-10-CM | POA: Diagnosis not present

## 2017-09-28 DIAGNOSIS — R2681 Unsteadiness on feet: Secondary | ICD-10-CM | POA: Diagnosis not present

## 2017-10-03 DIAGNOSIS — R41841 Cognitive communication deficit: Secondary | ICD-10-CM | POA: Diagnosis not present

## 2017-10-03 DIAGNOSIS — R2681 Unsteadiness on feet: Secondary | ICD-10-CM | POA: Diagnosis not present

## 2017-10-12 DIAGNOSIS — R41841 Cognitive communication deficit: Secondary | ICD-10-CM | POA: Diagnosis not present

## 2017-10-12 DIAGNOSIS — R2681 Unsteadiness on feet: Secondary | ICD-10-CM | POA: Diagnosis not present

## 2017-10-14 DIAGNOSIS — R41841 Cognitive communication deficit: Secondary | ICD-10-CM | POA: Diagnosis not present

## 2017-10-19 DIAGNOSIS — R41841 Cognitive communication deficit: Secondary | ICD-10-CM | POA: Diagnosis not present

## 2017-10-20 ENCOUNTER — Encounter: Payer: Self-pay | Admitting: Neurology

## 2017-10-28 DIAGNOSIS — R41841 Cognitive communication deficit: Secondary | ICD-10-CM | POA: Diagnosis not present

## 2017-10-30 ENCOUNTER — Other Ambulatory Visit: Payer: Self-pay | Admitting: Physician Assistant

## 2017-10-31 DIAGNOSIS — R41841 Cognitive communication deficit: Secondary | ICD-10-CM | POA: Diagnosis not present

## 2017-11-09 DIAGNOSIS — R41841 Cognitive communication deficit: Secondary | ICD-10-CM | POA: Diagnosis not present

## 2017-11-14 DIAGNOSIS — R41841 Cognitive communication deficit: Secondary | ICD-10-CM | POA: Diagnosis not present

## 2017-11-23 DIAGNOSIS — R41841 Cognitive communication deficit: Secondary | ICD-10-CM | POA: Diagnosis not present

## 2017-12-28 ENCOUNTER — Other Ambulatory Visit: Payer: Self-pay | Admitting: Physician Assistant

## 2018-01-06 ENCOUNTER — Encounter: Payer: Self-pay | Admitting: Neurology

## 2018-01-06 ENCOUNTER — Other Ambulatory Visit: Payer: Self-pay

## 2018-01-06 ENCOUNTER — Ambulatory Visit (INDEPENDENT_AMBULATORY_CARE_PROVIDER_SITE_OTHER): Payer: Medicare PPO | Admitting: Neurology

## 2018-01-06 ENCOUNTER — Other Ambulatory Visit: Payer: Medicare PPO

## 2018-01-06 VITALS — BP 124/66 | HR 82 | Ht 60.0 in | Wt 125.0 lb

## 2018-01-06 DIAGNOSIS — F03A0 Unspecified dementia, mild, without behavioral disturbance, psychotic disturbance, mood disturbance, and anxiety: Secondary | ICD-10-CM

## 2018-01-06 DIAGNOSIS — F0390 Unspecified dementia without behavioral disturbance: Secondary | ICD-10-CM | POA: Diagnosis not present

## 2018-01-06 DIAGNOSIS — F419 Anxiety disorder, unspecified: Secondary | ICD-10-CM | POA: Diagnosis not present

## 2018-01-06 DIAGNOSIS — R413 Other amnesia: Secondary | ICD-10-CM

## 2018-01-06 MED ORDER — ESCITALOPRAM OXALATE 5 MG PO TABS: 5.0000 mg | ORAL_TABLET | Freq: Every day | ORAL | 6 refills | Status: DC

## 2018-01-06 NOTE — Progress Notes (Signed)
NEUROLOGY CONSULTATION NOTE  Sheila Parrish MRN: [id number] DOB: 09/19/1927  Referring provider: Dr. Lavone Orn Primary care provider: Dr. Lavone Orn  Reason for consult:  Memory loss  Dear Dr Laurann Montana:  Thank you for your kind referral of Sheila Parrish for consultation of the above symptoms. Although her history is well known to you, please allow me to reiterate it for the purpose of our medical record. The patient was accompanied to the clinic by her daughter who also provides collateral information. Records and images were personally reviewed where available.   HISTORY OF PRESENT ILLNESS: This is an 84 year old right-handed woman with a history of hypertension, hyperlipidemia, presenting for evaluation of worsening memory. I had recently seen her husband for dementia, and her daughter expressed concern that since she had been taking care of her husband round the clock, this seems to have taken a toll on her as well. She feels her memory is fine. Her daughter started noticing cognitive changes a few years ago, since she started taking 24/7 care of her husband. She would repeat herself, and was unsure about things going on with her husband. She had been in charge of bills, but was starting to worry about it a lot, so her daughter took over last April 2018. She takes her medications independently. She continues to drive without getting lost. She misplaces things frequently. They recently moved to [facility] where a caregiver comes in regularly. There may be some paranoia, as she would be missing money and would not know who took it, she would hide things in different places, but it would a "hunting expedition" to look for these. She has occasional word-finding difficulties. There is no family history of dementia. Her father had a stroke. She denies any history of significant head injuries. She drinks an occasional glass of wine. Her daughter denies any hallucinations. She has chronic  back pain. She denies any headaches, dizziness, diplopia, dysarthria/dysphagia, neck pain, focal numbness/tingling/weakness, bowel/bladder dysfunction, anosmia, or tremors.   PAST MEDICAL HISTORY: Past Medical History:  Diagnosis Date  . High cholesterol   . Scarlet fever 1936   Hospitalized for a month    PAST SURGICAL HISTORY: Past Surgical History:  Procedure Laterality Date  . ABDOMINAL HYSTERECTOMY    . TOTAL KNEE ARTHROPLASTY Left 2010    MEDICATIONS: Current Outpatient Medications on File Prior to Visit  Medication Sig Dispense Refill  . aspirin EC 81 MG tablet Take 81 mg by mouth daily.    . Calcium Citrate-Vitamin D (CITRACAL + D PO) Take 2 tablets by mouth 2 (two) times daily. Take 2 tablets after breakfast and 2 tablets after lunch    . metoprolol tartrate (LOPRESSOR) 25 MG tablet Take 0.5 tablets (12.5 mg total) by mouth 2 (two) times daily. Please make overdue yearly appt with Dr. Radford Pax. 2nd attempt 15 tablet 0  . Multiple Vitamin (MULTIVITAMIN WITH MINERALS) TABS Take 1 tablet by mouth daily.     No current facility-administered medications on file prior to visit.     ALLERGIES: Allergies  Allergen Reactions  . Alendronate     Difficulty swallowing  . Atorvastatin     Leg cramping  . Azithromycin   . Bactrim [Sulfamethoxazole-Trimethoprim] Other (See Comments)    Reaction unknown  . Macrodantin [Nitrofurantoin Macrocrystal]   . Simvastatin     Leg cramping  . Sulfa Antibiotics Other (See Comments)    Reaction unknown    FAMILY HISTORY: Family History  Problem Relation Age  of Onset  . Arthritis Mother        Died at 42  . Stroke Father 61       Died at age 37  . CAD Neg Hx     SOCIAL HISTORY: Social History   Socioeconomic History  . Marital status: Married    Spouse name: Not on file  . Number of children: Not on file  . Years of education: Not on file  . Highest education level: Not on file  Social Needs  . Financial resource strain:  Not on file  . Food insecurity - worry: Not on file  . Food insecurity - inability: Not on file  . Transportation needs - medical: Not on file  . Transportation needs - non-medical: Not on file  Occupational History  . Occupation: Retired  Tobacco Use  . Smoking status: Never Smoker  . Smokeless tobacco: Never Used  Substance and Sexual Activity  . Alcohol use: No  . Drug use: No  . Sexual activity: Not on file  Other Topics Concern  . Not on file  Social History Narrative   Lives in [place] with Husband    REVIEW OF SYSTEMS: Constitutional: No fevers, chills, or sweats, no generalized fatigue, change in appetite Eyes: No visual changes, double vision, eye pain Ear, nose and throat: No hearing loss, ear pain, nasal congestion, sore throat Cardiovascular: No chest pain, palpitations Respiratory:  No shortness of breath at rest or with exertion, wheezes GastrointestinaI: No nausea, vomiting, diarrhea, abdominal pain, fecal incontinence Genitourinary:  No dysuria, urinary retention or frequency Musculoskeletal:  No neck pain, +back pain Integumentary: No rash, pruritus, skin lesions Neurological: as above Psychiatric: No depression, insomnia, anxiety Endocrine: No palpitations, fatigue, diaphoresis, mood swings, change in appetite, change in weight, increased thirst Hematologic/Lymphatic:  No anemia, purpura, petechiae. Allergic/Immunologic: no itchy/runny eyes, nasal congestion, recent allergic reactions, rashes  PHYSICAL EXAM: Vitals:   01/06/18 1033  BP: 124/66  Pulse: 82  SpO2: 93%   General: No acute distress Head:  Normocephalic/atraumatic Eyes: Fundoscopic exam shows bilateral sharp discs, no vessel changes, exudates, or hemorrhages Neck: supple, no paraspinal tenderness, full range of motion Back: No paraspinal tenderness Heart: regular rate and rhythm Lungs: Clear to auscultation bilaterally. Vascular: No carotid bruits. Skin/Extremities: No rash, no  edema Neurological Exam: Mental status: alert and oriented to person, place, and time, no dysarthria or aphasia, Fund of knowledge is appropriate.  Recent and remote memory are intact.  Attention and concentration are normal.    Able to name objects and repeat phrases.  Montreal Cognitive Assessment  01/06/2018  Visuospatial/ Executive (0/5) 3  Naming (0/3) 3  Attention: Read list of digits (0/2) 2  Attention: Read list of letters (0/1) 0  Attention: Serial 7 subtraction starting at 100 (0/3) 1  Language: Repeat phrase (0/2) 2  Language : Fluency (0/1) 0  Abstraction (0/2) 2  Delayed Recall (0/5) 0  Orientation (0/6) 2  Total 15   Cranial nerves: CN I: not tested CN II: pupils equal, round and reactive to light, visual fields intact, fundi unremarkable. CN III, IV, VI:  full range of motion, no nystagmus, no ptosis CN V: facial sensation intact CN VII: upper and lower face symmetric CN VIII: hearing intact to finger rub CN IX, X: gag intact, uvula midline CN XI: sternocleidomastoid and trapezius muscles intact CN XII: tongue midline Bulk & Tone: normal, no fasciculations. Motor: 5/5 throughout with no pronator drift. Sensation: intact to light touch, cold, pin, vibration  and joint position sense.  No extinction to double simultaneous stimulation.  Romberg test negative Deep Tendon Reflexes: +1 bilateral UE, +2 bilateral patella, unable to elicit ankle jerks bilaterally, no ankle clonus Plantar responses: downgoing bilaterally Cerebellar: no incoordination on finger to nose testing Gait: narrow-based and steady, difficulty with tandem walk, no ataxia Tremor: none  IMPRESSION: This is an 84 year old right-handed woman with a history of hypertension, hyperlipidemia, presenting for evaluation of worsening memory. Her neurological exam is non-focal, MOCA score today is 15/30, indicating mild dementia. We discussed different causes of memory loss. Check TSH and B12. MRI brain without  contrast will be ordered to assess for underlying structural abnormality. Her daughter is concerned that her 24/7 care of her husband had taken a toll on her, we discussed how mood and stress can cause memory issues as well, and have agreed to start low dose Lexapro 5mg  daily. Side effects were discussed. Her husband will be moving to [place] soon, her daughter feels this would be good for her. We have agreed to hold off on starting Aricept, and will re-evaluate in 6 months. Continue to monitor driving.   Thank you for allowing me to participate in the care of this patient. Please do not hesitate to call for any questions or concerns.   Sheila Parrish, M.D.  CC: Dr. Laurann Montana

## 2018-01-06 NOTE — Patient Instructions (Addendum)
1. Start Lexapro 5mg  at bedtime 2. Schedule MRI brain without contrast  We have sent a referral to [facility] for your MRI and they will call you directly to schedule your appt. They are located at [place]. If you need to contact them directly please call [phone].   3. Bloodwork for TSH, B12  Your provider has requested that you have labwork completed today. Please go to [facility] ([id number]) on the second floor of this building before leaving the office today. You do not need to check in. If you are not called within 15 minutes please check with the front desk.   4. Follow-up in 6 months, call for any changes  FALL PRECAUTIONS: Be cautious when walking. Scan the area for obstacles that may increase the risk of trips and falls. When getting up in the mornings, sit up at the edge of the bed for a few minutes before getting out of bed. Consider elevating the bed at the head end to avoid drop of blood pressure when getting up. Walk always in a well-lit room (use night lights in the walls). Avoid area rugs or power cords from appliances in the middle of the walkways. Use a walker or a cane if necessary and consider physical therapy for balance exercise. Get your eyesight checked regularly.  FINANCIAL OVERSIGHT: Supervision, especially oversight when making financial decisions or transactions is also recommended.  HOME SAFETY: Consider the safety of the kitchen when operating appliances like stoves, microwave oven, and blender. Consider having supervision and share cooking responsibilities until no longer able to participate in those. Accidents with firearms and other hazards in the house should be identified and addressed as well.  DRIVING: Regarding driving, in patients with progressive memory problems, driving will be impaired. We advise to have someone else do the driving if trouble finding directions or if minor accidents are reported. Independent driving  assessment is available to determine safety of driving.  ABILITY TO BE LEFT ALONE: If patient is unable to contact 911 operator, consider using LifeLine, or when the need is there, arrange for someone to stay with patients. Smoking is a fire hazard, consider supervision or cessation. Risk of wandering should be assessed by caregiver and if detected at any point, supervision and safe proof recommendations should be instituted.  MEDICATION SUPERVISION: Inability to self-administer medication needs to be constantly addressed. Implement a mechanism to ensure safe administration of the medications.  RECOMMENDATIONS FOR ALL PATIENTS WITH MEMORY PROBLEMS: 1. Continue to exercise (Recommend 30 minutes of walking everyday, or 3 hours every week) 2. Increase social interactions - continue going to [place] and enjoy social gatherings with friends and family 3. Eat healthy, avoid fried foods and eat more fruits and vegetables 4. Maintain adequate blood pressure, blood sugar, and blood cholesterol level. Reducing the risk of stroke and cardiovascular disease also helps promoting better memory. 5. Avoid stressful situations. Live a simple life and avoid aggravations. Organize your time and prepare for the next day in anticipation. 6. Sleep well, avoid any interruptions of sleep and avoid any distractions in the bedroom that may interfere with adequate sleep quality 7. Avoid sugar, avoid sweets as there is a strong link between excessive sugar intake, diabetes, and cognitive impairment The Mediterranean diet has been shown to help patients reduce the risk of progressive memory disorders and reduces cardiovascular risk. This includes eating fish, eat fruits and green leafy vegetables, nuts like almonds and hazelnuts, walnuts, and also use olive oil. Avoid  fast foods and fried foods as much as possible. Avoid sweets and sugar as sugar use has been linked to worsening of memory function.  There is always a concern of  gradual progression of memory problems. If this is the case, then we may need to adjust level of care according to patient needs. Support, both to the patient and caregiver, should then be put into place.

## 2018-01-07 LAB — TSH: TSH: 1.99 mIU/L (ref 0.40–4.50)

## 2018-01-07 LAB — VITAMIN B12: VITAMIN B 12: 341 pg/mL (ref 200–1100)

## 2018-01-09 ENCOUNTER — Telehealth: Payer: Self-pay

## 2018-01-09 NOTE — Telephone Encounter (Signed)
Spoke with pt's daughter, [name], relaying message below.  [name] is wanting to know how strong dementia plays in genetics, considering that both of her parents have it now.  Please advise.

## 2018-01-09 NOTE — Telephone Encounter (Signed)
-----   Message from Cameron Sprang, MD sent at 01/09/2018  9:50 AM EST ----- Pls let daughter know the patient's thyroid and B12 levels are normal, thanks

## 2018-01-09 NOTE — Telephone Encounter (Signed)
ERROR please see previous phone encounter

## 2018-01-10 NOTE — Telephone Encounter (Signed)
Called phone number listed for pt's daughter, [name].  No answer and a man's voice on VM.   I did not leave a message as pt does not have a female on her DPR.

## 2018-01-10 NOTE — Telephone Encounter (Signed)
Pls let her know that her parents have LATER-onset dementia. Studies have shown that EARLY onset dementia can be inheritable, and this is rare, less than 5%. With later-onset dementia, it is even less, there may be a slightly increased risk, if any, but not as well described as early onset dementia risk. At this point, recommendations for family members with a relatives with dementia is to stay healthy and active.

## 2018-01-11 NOTE — Telephone Encounter (Signed)
Spoke with pt's daughter relaying message below.  She sounded relieved.  States that the phone number for her is correct, it is her some on VM.

## 2018-01-11 NOTE — Telephone Encounter (Signed)
Called daughter again.  Again, no answer.  LMOM this time asking for return call to the office for message below.

## 2018-01-22 ENCOUNTER — Other Ambulatory Visit: Payer: Self-pay | Admitting: Physician Assistant

## 2018-03-02 ENCOUNTER — Other Ambulatory Visit: Payer: Self-pay

## 2018-03-02 MED ORDER — ESCITALOPRAM OXALATE 5 MG PO TABS: 5.0000 mg | ORAL_TABLET | Freq: Every day | ORAL | 3 refills | Status: DC

## 2018-03-06 ENCOUNTER — Telehealth: Payer: Self-pay | Admitting: Neurology

## 2018-03-06 NOTE — Telephone Encounter (Signed)
Pt's daughter [name] left a voicemail message saying Dr Delice Lesch put pt on an anti anxiety medication and Dr Laurann Montana was not aware of that and wanted to know if that was customary to not let the pcp know, please advise

## 2018-03-07 NOTE — Telephone Encounter (Signed)
Returned call to [name].  LMOM letting her know that we do not typically consult PCP or any other physician prior to prescribing medications.

## 2018-03-22 ENCOUNTER — Other Ambulatory Visit: Payer: Self-pay | Admitting: Physician Assistant

## 2018-03-24 ENCOUNTER — Other Ambulatory Visit: Payer: Self-pay

## 2018-03-24 MED ORDER — METOPROLOL TARTRATE 25 MG PO TABS: 12.5000 mg | ORAL_TABLET | Freq: Two times a day (BID) | ORAL | 0 refills | Status: DC

## 2018-04-05 ENCOUNTER — Telehealth: Payer: Self-pay | Admitting: Neurology

## 2018-04-05 ENCOUNTER — Other Ambulatory Visit: Payer: Self-pay | Admitting: Cardiology

## 2018-04-05 NOTE — Telephone Encounter (Signed)
Patient is going down hill fast. Patient's daughter is needing to see if her mom is bouncing back at all. The facility where she is living is wanting to know. Patient is scheduled to come in July 30th. Please Advise.

## 2018-04-05 NOTE — Telephone Encounter (Signed)
Returned call.  No answer.  LMOM asking for return call.  

## 2018-04-06 ENCOUNTER — Telehealth: Payer: Self-pay

## 2018-04-06 NOTE — Telephone Encounter (Signed)
Spoke with pt's daughter.  She states that her father was placed in memory care in February.  Since then pt has had good days and bad days.  Daughter is wanting evaluation to compare pt to how she was during last visit with Dr. Delice Lesch and now (next visit).  She feels that pt is doing better, since not having to be the care provider for her husband, but at the same time she is also noticing things are are "off" about pt (Pt stated at one time that she was going to "bring husband back from memory care just for a few days"  - this is not an option for that)

## 2018-04-06 NOTE — Telephone Encounter (Signed)
ERROR pls see previous phone encounter    

## 2018-04-07 ENCOUNTER — Encounter: Payer: Self-pay | Admitting: Cardiology

## 2018-04-26 ENCOUNTER — Ambulatory Visit (INDEPENDENT_AMBULATORY_CARE_PROVIDER_SITE_OTHER): Payer: Medicare PPO | Admitting: Cardiology

## 2018-04-26 ENCOUNTER — Encounter: Payer: Self-pay | Admitting: Cardiology

## 2018-04-26 VITALS — BP 130/72 | HR 68 | Ht 61.0 in | Wt 128.0 lb

## 2018-04-26 DIAGNOSIS — R0600 Dyspnea, unspecified: Secondary | ICD-10-CM | POA: Diagnosis not present

## 2018-04-26 MED ORDER — METOPROLOL TARTRATE 25 MG PO TABS: 12.5000 mg | ORAL_TABLET | Freq: Two times a day (BID) | ORAL | 2 refills | Status: DC

## 2018-04-26 NOTE — Patient Instructions (Addendum)
Medication Instructions:   STOP TAKING ASPIRIN   If you need a refill on your cardiac medications before your next appointment, please call your pharmacy.  Labwork:  BNP TODAY    Testing/Procedures:  NONE ORDERED  TODAY    Follow-Up:  3 MONTHS WITH TURNER    Any Other Special Instructions Will Be Listed Below (If Applicable).

## 2018-04-26 NOTE — Progress Notes (Signed)
04/26/2018 Sheila Parrish   08-29-1927  [id number]  Primary Physician Lavone Orn, MD Primary Cardiologist: Dr. Radford Pax  Reason for Visit/CC: medication refills/ follow-up for PVCs  HPI:  Sheila Parrish a 84 y.o.femalewith hx of HL, scarlet fever and no hx of CAD, who presents to clinic for routine f/u given h/o PVCs and diastolic HF and is in need of medication refills. She was admitted 10/29/16 for near syncope. Head CT and orthostatics were negative. She was noted to have PVCs/ ventricular bigeminy on telemetry. Cardiac enzymes were negative. 2D echo showed normal LVF, 55-60% with mild diastolic dysfunction. She was placed on a low dose BB, metoprolol 12.5 mg BID, and was monitored overnight and had only occasional PVCs after initiation of BB. She was discharged home with a 30 day monitor. This showed NSR with avg HR of 73 bpm. She had one episode of atrial tach of 5 beats. No other arrhthymias.   Evaluated her for post hospital follow-up, after monitor, on December 23, 2016.  She reported that she was doing well at that time without any recurrent symptoms.  She denied further syncope/near syncope.  No palpitations.  She also noted at that time that she was tolerating metoprolol well without any side effects.  Her heart rate and blood pressure were stable.  She was instructed to continue with metoprolol and follow-up with Dr. Radford Pax in 6 months however patient failed to follow-up.  She is back in clinic today for reassessment and is also in need of medication refills. She is here with her grandaughter. She lives in an assisted living facility and reports doing well since last OV w/o any issues. She denies any recurrent syncope/ near syncope. No CP or dyspnea. She dose have slight bilateral LEE on exam. No orthopnea or PND. EKG today shows normal sinus rhythm.  No ischemic abnormalities.  Heart rate is well controlled at 68 bpm.  Her blood pressure is also well controlled at 130/72. Prior echo  showed mild diastolic HF but she has not required any diuretics.     Current Meds  Medication Sig  . aspirin EC 81 MG tablet Take 81 mg by mouth daily.  . Calcium Citrate-Vitamin D (CITRACAL + D PO) Take 2 tablets by mouth 2 (two) times daily. Take 2 tablets after breakfast and 2 tablets after lunch  . escitalopram (LEXAPRO) 5 MG tablet Take 1 tablet (5 mg total) by mouth at bedtime.  . metoprolol tartrate (LOPRESSOR) 25 MG tablet TAKE 1/2 TABLET BY MOUTH 2 TIMES DAILY.  . Multiple Vitamin (MULTIVITAMIN WITH MINERALS) TABS Take 1 tablet by mouth daily.   Allergies  Allergen Reactions  . Alendronate     Difficulty swallowing  . Atorvastatin     Leg cramping  . Azithromycin   . Bactrim [Sulfamethoxazole-Trimethoprim] Other (See Comments)    Reaction unknown  . Macrodantin [Nitrofurantoin Macrocrystal]   . Simvastatin     Leg cramping  . Sulfa Antibiotics Other (See Comments)    Reaction unknown   Past Medical History:  Diagnosis Date  . High cholesterol   . Scarlet fever 1936   Hospitalized for a month   Family History  Problem Relation Age of Onset  . Arthritis Mother        Died at 73  . Stroke Father 21       Died at age 65  . CAD Neg Hx    Past Surgical History:  Procedure Laterality Date  . ABDOMINAL HYSTERECTOMY    .  TOTAL KNEE ARTHROPLASTY Left 2010   Social History   Socioeconomic History  . Marital status: Married    Spouse name: Not on file  . Number of children: Not on file  . Years of education: Not on file  . Highest education level: Not on file  Occupational History  . Occupation: Retired  Scientific laboratory technician  . Financial resource strain: Not on file  . Food insecurity:    Worry: Not on file    Inability: Not on file  . Transportation needs:    Medical: Not on file    Non-medical: Not on file  Tobacco Use  . Smoking status: Never Smoker  . Smokeless tobacco: Never Used  Substance and Sexual Activity  . Alcohol use: No  . Drug use: No  .  Sexual activity: Not on file  Lifestyle  . Physical activity:    Days per week: Not on file    Minutes per session: Not on file  . Stress: Not on file  Relationships  . Social connections:    Talks on phone: Not on file    Gets together: Not on file    Attends religious service: Not on file    Active member of club or organization: Not on file    Attends meetings of clubs or organizations: Not on file    Relationship status: Not on file  . Intimate partner violence:    Fear of current or ex partner: Not on file    Emotionally abused: Not on file    Physically abused: Not on file    Forced sexual activity: Not on file  Other Topics Concern  . Not on file  Social History Narrative   Lives in 1 story apartment on the 2nd floor, with her husband   Has 3 adult children   4 year degree   Retired 1st grade teacher     Review of Systems: General: negative for chills, fever, night sweats or weight changes.  Cardiovascular: negative for chest pain, dyspnea on exertion, edema, orthopnea, palpitations, paroxysmal nocturnal dyspnea or shortness of breath Dermatological: negative for rash Respiratory: negative for cough or wheezing Urologic: negative for hematuria Abdominal: negative for nausea, vomiting, diarrhea, bright red blood per rectum, melena, or hematemesis Neurologic: negative for visual changes, syncope, or dizziness All other systems reviewed and are otherwise negative except as noted above.   Physical Exam:  Blood pressure 130/72, pulse 68, height 5\' 1"  (1.549 m), weight 128 lb (58.1 kg), SpO2 95 %.  General appearance: alert, cooperative and no distress Neck: no carotid bruit and no JVD Lungs: right sided crackles w/ slight improvement after cough, clear on the right Heart: regular rate and rhythm, S1, S2 normal, no murmur, click, rub or gallop Extremities: 1+ bilateral ankle edema Pulses: 2+ and symmetric Skin: Skin color, texture, turgor normal. No rashes or  lesions Neurologic: Grossly normal  EKG NSR 68 bpm -- personally reviewed   ASSESSMENT AND PLAN:   1. PVCs: controlled with metoprolol. She denies palpitations. EKG shows NSR. HR 68 bpm. BP stable. Will continue metoprolol 12.5 mg BID.   2. Diastolic Dysfunction: noted on echo in 2017. EF normal. She denies dyspnea, but she has slight 1+ ankle edema on exam and has faint crackles at the right lung base that improves slightly with cough. We will check a BNP today and will add low dose diuretic if elevated. We will also check a BMP for baseline assessment of renal function and electrolytes, in case we  need to add low dose diuretic.   Follow-Up w/ Dr. Radford Pax in 3 months. If abnormal labs and if we end up adding low dose diuretic, then we will arrange f/u with APP in 2-3 weeks to reassess.     Brittainy Ladoris Gene, MHS [facility] 04/26/2018 4:21 PM

## 2018-04-27 LAB — PRO B NATRIURETIC PEPTIDE: NT-PRO BNP: 366 pg/mL (ref 0–738)

## 2018-04-28 DIAGNOSIS — L218 Other seborrheic dermatitis: Secondary | ICD-10-CM | POA: Diagnosis not present

## 2018-04-28 DIAGNOSIS — Z85828 Personal history of other malignant neoplasm of skin: Secondary | ICD-10-CM | POA: Diagnosis not present

## 2018-04-28 DIAGNOSIS — L821 Other seborrheic keratosis: Secondary | ICD-10-CM | POA: Diagnosis not present

## 2018-04-28 DIAGNOSIS — L820 Inflamed seborrheic keratosis: Secondary | ICD-10-CM | POA: Diagnosis not present

## 2018-06-13 DIAGNOSIS — R413 Other amnesia: Secondary | ICD-10-CM | POA: Diagnosis not present

## 2018-06-13 DIAGNOSIS — M1711 Unilateral primary osteoarthritis, right knee: Secondary | ICD-10-CM | POA: Diagnosis not present

## 2018-06-13 DIAGNOSIS — I480 Paroxysmal atrial fibrillation: Secondary | ICD-10-CM | POA: Diagnosis not present

## 2018-06-13 DIAGNOSIS — F419 Anxiety disorder, unspecified: Secondary | ICD-10-CM | POA: Diagnosis not present

## 2018-06-13 DIAGNOSIS — Z0000 Encounter for general adult medical examination without abnormal findings: Secondary | ICD-10-CM | POA: Diagnosis not present

## 2018-06-13 DIAGNOSIS — M810 Age-related osteoporosis without current pathological fracture: Secondary | ICD-10-CM | POA: Diagnosis not present

## 2018-06-13 DIAGNOSIS — Z1389 Encounter for screening for other disorder: Secondary | ICD-10-CM | POA: Diagnosis not present

## 2018-06-19 ENCOUNTER — Encounter: Payer: Self-pay | Admitting: Cardiology

## 2018-07-11 ENCOUNTER — Ambulatory Visit: Payer: Medicare PPO | Admitting: Neurology

## 2018-07-14 ENCOUNTER — Encounter: Payer: Self-pay | Admitting: Neurology

## 2018-07-14 ENCOUNTER — Ambulatory Visit (INDEPENDENT_AMBULATORY_CARE_PROVIDER_SITE_OTHER): Payer: Medicare PPO | Admitting: Neurology

## 2018-07-14 ENCOUNTER — Other Ambulatory Visit: Payer: Self-pay

## 2018-07-14 VITALS — BP 94/58 | HR 63 | Ht 60.0 in | Wt 131.0 lb

## 2018-07-14 DIAGNOSIS — F0390 Unspecified dementia without behavioral disturbance: Secondary | ICD-10-CM

## 2018-07-14 DIAGNOSIS — F03A0 Unspecified dementia, mild, without behavioral disturbance, psychotic disturbance, mood disturbance, and anxiety: Secondary | ICD-10-CM

## 2018-07-14 MED ORDER — DONEPEZIL HCL 10 MG PO TABS: ORAL_TABLET | ORAL | 3 refills | Status: DC

## 2018-07-14 NOTE — Progress Notes (Signed)
NEUROLOGY FOLLOW UP OFFICE NOTE  Sheila Parrish [id number] [birth year]  HISTORY OF PRESENT ILLNESS: I had the pleasure of seeing Sheila Parrish in follow-up in the neurology clinic on 07/14/2018.  The patient was last seen 6 months ago for mild dementia and is again accompanied by her daughter who helps supplement the history today.  Records and images were personally reviewed where available.  TSH and B12 normal. MRI brain has not been done yet. Since her last visit, her daughter reports "iffy shorter term memory." Her daughter called her this morning to remind her about today's appointment, she forgot to come outside 5 minutes later. She did not recall their wedding anniversary, which was yesterday. Her daughter fixes her pillbox and staff at [facility] has access to it in a lockbox to administer to her. Daughter manages finances. She does not drive. She is independent with dressing and bathing. Appetite and sleep are good, she rests more at night and during the day. No paranoia or hallucinations. She was started on Lexapro 5mg  daily, her daughter is unsure if this helps much. She denies any headaches, dizziness, diplopia, dysarthria/dysphagia, neck pain, focal numbness/tingling/weakness, bowel/bladder dysfunction, anosmia, or tremors.   History on Initial Assessment 01/06/2018: This is an 84 year old right-handed woman with a history of hypertension, hyperlipidemia, presenting for evaluation of worsening memory. I had recently seen her husband for dementia, and her daughter expressed concern that since she had been taking care of her husband round the clock, this seems to have taken a toll on her as well. She feels her memory is fine. Her daughter started noticing cognitive changes a few years ago, since she started taking 24/7 care of her husband. She would repeat herself, and was unsure about things going on with her husband. She had been in charge of bills, but was starting to worry about it a lot,  so her daughter took over last April 2018. She takes her medications independently. She continues to drive without getting lost. She misplaces things frequently. They recently moved to [facility] where a caregiver comes in regularly. There may be some paranoia, as she would be missing money and would not know who took it, she would hide things in different places, but it would a "hunting expedition" to look for these. She has occasional word-finding difficulties. There is no family history of dementia. Her father had a stroke. She denies any history of significant head injuries. She drinks an occasional glass of wine. Her daughter denies any hallucinations. She has chronic back pain.   PAST MEDICAL HISTORY: Past Medical History:  Diagnosis Date  . High cholesterol   . Scarlet fever 1936   Hospitalized for a month    MEDICATIONS: Current Outpatient Medications on File Prior to Visit  Medication Sig Dispense Refill  . Calcium Citrate-Vitamin D (CITRACAL + D PO) Take 2 tablets by mouth 2 (two) times daily. Take 2 tablets after breakfast and 2 tablets after lunch    . escitalopram (LEXAPRO) 5 MG tablet Take 1 tablet (5 mg total) by mouth at bedtime. 90 tablet 3  . metoprolol tartrate (LOPRESSOR) 25 MG tablet Take 0.5 tablets (12.5 mg total) by mouth 2 (two) times daily. 90 tablet 2  . Multiple Vitamin (MULTIVITAMIN WITH MINERALS) TABS Take 1 tablet by mouth daily.     No current facility-administered medications on file prior to visit.     ALLERGIES: Allergies  Allergen Reactions  . Alendronate     Difficulty swallowing  .  Atorvastatin     Leg cramping  . Azithromycin   . Bactrim [Sulfamethoxazole-Trimethoprim] Other (See Comments)    Reaction unknown  . Macrodantin [Nitrofurantoin Macrocrystal]   . Simvastatin     Leg cramping  . Sulfa Antibiotics Other (See Comments)    Reaction unknown    FAMILY HISTORY: Family History  Problem Relation Age of Onset  . Arthritis Mother          Died at 27  . Stroke Father 43       Died at age 31  . CAD Neg Hx     SOCIAL HISTORY: Social History   Socioeconomic History  . Marital status: Married    Spouse name: Not on file  . Number of children: Not on file  . Years of education: Not on file  . Highest education level: Not on file  Occupational History  . Occupation: Retired  Scientific laboratory technician  . Financial resource strain: Not on file  . Food insecurity:    Worry: Not on file    Inability: Not on file  . Transportation needs:    Medical: Not on file    Non-medical: Not on file  Tobacco Use  . Smoking status: Never Smoker  . Smokeless tobacco: Never Used  Substance and Sexual Activity  . Alcohol use: No  . Drug use: No  . Sexual activity: Not on file  Lifestyle  . Physical activity:    Days per week: Not on file    Minutes per session: Not on file  . Stress: Not on file  Relationships  . Social connections:    Talks on phone: Not on file    Gets together: Not on file    Attends religious service: Not on file    Active member of club or organization: Not on file    Attends meetings of clubs or organizations: Not on file    Relationship status: Not on file  . Intimate partner violence:    Fear of current or ex partner: Not on file    Emotionally abused: Not on file    Physically abused: Not on file    Forced sexual activity: Not on file  Other Topics Concern  . Not on file  Social History Narrative   Lives in 1 story apartment on the 2nd floor, with her husband   Has 3 adult children   4 year degree   Retired 1st grade teacher    REVIEW OF SYSTEMS: Constitutional: No fevers, chills, or sweats, no generalized fatigue, change in appetite Eyes: No visual changes, double vision, eye pain Ear, nose and throat: No hearing loss, ear pain, nasal congestion, sore throat Cardiovascular: No chest pain, palpitations Respiratory:  No shortness of breath at rest or with exertion, wheezes GastrointestinaI: No  nausea, vomiting, diarrhea, abdominal pain, fecal incontinence Genitourinary:  No dysuria, urinary retention or frequency Musculoskeletal:  No neck pain, back pain Integumentary: No rash, pruritus, skin lesions Neurological: as above Psychiatric: No depression, insomnia, anxiety Endocrine: No palpitations, fatigue, diaphoresis, mood swings, change in appetite, change in weight, increased thirst Hematologic/Lymphatic:  No anemia, purpura, petechiae. Allergic/Immunologic: no itchy/runny eyes, nasal congestion, recent allergic reactions, rashes  PHYSICAL EXAM: Vitals:   07/14/18 1451  BP: (!) 94/58  Pulse: 63  SpO2: 94%   General: No acute distress Head:  Normocephalic/atraumatic Neck: supple, no paraspinal tenderness, full range of motion Heart:  Regular rate and rhythm Lungs:  Clear to auscultation bilaterally Back: No paraspinal tenderness Skin/Extremities: No rash,  no edema Neurological Exam: alert and oriented to person, place, season. No aphasia or dysarthria. Fund of knowledge is reduced.  Recent and remote memory are impaired.  Attention and concentration are normal.    Able to name objects and repeat phrases. CDT 4/5 MMSE - Mini Mental State Exam 07/14/2018  Orientation to time 1  Orientation to Place 4  Registration 3  Attention/ Calculation 3  Recall 0  Language- name 2 objects 2  Language- repeat 1  Language- follow 3 step command 2  Language- read & follow direction 1  Write a sentence 1  Copy design 0  Total score 18   Cranial nerves: Pupils equal, round, reactive to light. Extraocular movements intact with no nystagmus. Visual fields full. Facial sensation intact. No facial asymmetry. Tongue, uvula, palate midline.  Motor: Bulk and tone normal, muscle strength 5/5 throughout with no pronator drift.  Sensation to light touch intact.  No extinction to double simultaneous stimulation.  Deep tendon reflexes +1 throughout, toes downgoing.  Finger to nose testing intact.   Gait narrow-based and steady,difficulty with tandem walk.  IMPRESSION: This is an 84 yo RH woman with a history of hypertension, hyperlipidemia, and mild dementia. MMSE today 18/30 (15/30 in January 2019). They have not yet done MRI brain, proceed as planned. Start Donepezil 5mg  daily for 2 weeks, then increase to 10mg  daily. Side effects and expectations from the medication were discussed. If no issues in 3 months after starting Donepezil, her daughter was instructed to update [place] and we will plan to stop Lexapro. We again discussed the importance of control of vascular risk factors, physical exercise, and brain stimulation exercises for brain health. She will follow-up in 6 months and knows to call for any changes.   Thank you for allowing me to participate in her care.  Please do not hesitate to call for any questions or concerns.  The duration of this appointment visit was 30 minutes of face-to-face time with the patient.  Greater than 50% of this time was spent in counseling, explanation of diagnosis, planning of further management, and coordination of care.   Sheila Parrish, M.D.   CC: Dr. Laurann Montana

## 2018-07-14 NOTE — Patient Instructions (Addendum)
1. Proceed with MRI brain without contrast. You can reach [facility] directly at [phone] to schedule.  2. Start Donepezil (Aricept) 10mg : Take 1/2 tablet daily for 2 weeks, then increase to 1 tablet daily 3. Continue Lexapro 5mg  daily for now, update [place] in 3 months and if no issues with Aricept, we will stop the Lexapro 4. Follow-up in 6 months, call for any changes  FALL PRECAUTIONS: Be cautious when walking. Scan the area for obstacles that may increase the risk of trips and falls. When getting up in the mornings, sit up at the edge of the bed for a few minutes before getting out of bed. Consider elevating the bed at the head end to avoid drop of blood pressure when getting up. Walk always in a well-lit room (use night lights in the walls). Avoid area rugs or power cords from appliances in the middle of the walkways. Use a walker or a cane if necessary and consider physical therapy for balance exercise. Get your eyesight checked regularly.  HOME SAFETY: Consider the safety of the kitchen when operating appliances like stoves, microwave oven, and blender. Consider having supervision and share cooking responsibilities until no longer able to participate in those. Accidents with firearms and other hazards in the house should be identified and addressed as well.  ABILITY TO BE LEFT ALONE: If patient is unable to contact 911 operator, consider using LifeLine, or when the need is there, arrange for someone to stay with patients. Smoking is a fire hazard, consider supervision or cessation. Risk of wandering should be assessed by caregiver and if detected at any point, supervision and safe proof recommendations should be instituted.  RECOMMENDATIONS FOR ALL PATIENTS WITH MEMORY PROBLEMS: 1. Continue to exercise (Recommend 30 minutes of walking everyday, or 3 hours every week) 2. Increase social interactions - continue going to [place] and enjoy social gatherings with friends and family 3. Eat  healthy, avoid fried foods and eat more fruits and vegetables 4. Maintain adequate blood pressure, blood sugar, and blood cholesterol level. Reducing the risk of stroke and cardiovascular disease also helps promoting better memory. 5. Avoid stressful situations. Live a simple life and avoid aggravations. Organize your time and prepare for the next day in anticipation. 6. Sleep well, avoid any interruptions of sleep and avoid any distractions in the bedroom that may interfere with adequate sleep quality 7. Avoid sugar, avoid sweets as there is a strong link between excessive sugar intake, diabetes, and cognitive impairment The Mediterranean diet has been shown to help patients reduce the risk of progressive memory disorders and reduces cardiovascular risk. This includes eating fish, eat fruits and green leafy vegetables, nuts like almonds and hazelnuts, walnuts, and also use olive oil. Avoid fast foods and fried foods as much as possible. Avoid sweets and sugar as sugar use has been linked to worsening of memory function.  There is always a concern of gradual progression of memory problems. If this is the case, then we may need to adjust level of care according to patient needs. Support, both to the patient and caregiver, should then be put into place.

## 2018-07-25 ENCOUNTER — Encounter: Payer: Self-pay | Admitting: Neurology

## 2018-07-31 ENCOUNTER — Telehealth: Payer: Self-pay | Admitting: Neurology

## 2018-07-31 NOTE — Telephone Encounter (Signed)
Patient's daughter called and stated that the patient has been on the Aricept medication for 10 days now. She is currently taking the 1/2 a pill to see how she does on it. Daughter told her to stop taking medication because she was experiencing dizziness/disorientation and has thrown up 3xs in this time frame. Please call her back at [phone] with any advice on what she needs to have her mother do. Thanks.

## 2018-08-02 ENCOUNTER — Ambulatory Visit: Payer: Medicare PPO | Admitting: Cardiology

## 2018-08-15 ENCOUNTER — Other Ambulatory Visit: Payer: Self-pay | Admitting: Neurology

## 2018-08-31 DIAGNOSIS — B078 Other viral warts: Secondary | ICD-10-CM | POA: Diagnosis not present

## 2018-08-31 DIAGNOSIS — D1801 Hemangioma of skin and subcutaneous tissue: Secondary | ICD-10-CM | POA: Diagnosis not present

## 2018-08-31 DIAGNOSIS — L814 Other melanin hyperpigmentation: Secondary | ICD-10-CM | POA: Diagnosis not present

## 2018-08-31 DIAGNOSIS — D225 Melanocytic nevi of trunk: Secondary | ICD-10-CM | POA: Diagnosis not present

## 2018-08-31 DIAGNOSIS — L853 Xerosis cutis: Secondary | ICD-10-CM | POA: Diagnosis not present

## 2018-08-31 DIAGNOSIS — D2261 Melanocytic nevi of right upper limb, including shoulder: Secondary | ICD-10-CM | POA: Diagnosis not present

## 2018-08-31 DIAGNOSIS — L821 Other seborrheic keratosis: Secondary | ICD-10-CM | POA: Diagnosis not present

## 2018-08-31 DIAGNOSIS — Z85828 Personal history of other malignant neoplasm of skin: Secondary | ICD-10-CM | POA: Diagnosis not present

## 2018-11-08 ENCOUNTER — Observation Stay (HOSPITAL_COMMUNITY)
Admission: EM | Admit: 2018-11-08 | Discharge: 2018-11-10 | Disposition: A | Discharge status: Home / Self Care | Payer: Medicare PPO | Attending: Family Medicine | Admitting: Family Medicine

## 2018-11-08 ENCOUNTER — Emergency Department (HOSPITAL_COMMUNITY): Payer: Medicare PPO

## 2018-11-08 ENCOUNTER — Encounter (HOSPITAL_COMMUNITY): Payer: Self-pay

## 2018-11-08 DIAGNOSIS — R410 Disorientation, unspecified: Secondary | ICD-10-CM | POA: Diagnosis not present

## 2018-11-08 DIAGNOSIS — I499 Cardiac arrhythmia, unspecified: Secondary | ICD-10-CM

## 2018-11-08 DIAGNOSIS — R4020 Unspecified coma: Secondary | ICD-10-CM | POA: Diagnosis not present

## 2018-11-08 DIAGNOSIS — F329 Major depressive disorder, single episode, unspecified: Secondary | ICD-10-CM | POA: Insufficient documentation

## 2018-11-08 DIAGNOSIS — R55 Syncope and collapse: Secondary | ICD-10-CM | POA: Insufficient documentation

## 2018-11-08 DIAGNOSIS — I272 Pulmonary hypertension, unspecified: Secondary | ICD-10-CM | POA: Insufficient documentation

## 2018-11-08 DIAGNOSIS — E7800 Pure hypercholesterolemia, unspecified: Secondary | ICD-10-CM | POA: Diagnosis not present

## 2018-11-08 DIAGNOSIS — Z882 Allergy status to sulfonamides status: Secondary | ICD-10-CM | POA: Insufficient documentation

## 2018-11-08 DIAGNOSIS — Z881 Allergy status to other antibiotic agents status: Secondary | ICD-10-CM | POA: Diagnosis not present

## 2018-11-08 DIAGNOSIS — F0390 Unspecified dementia without behavioral disturbance: Secondary | ICD-10-CM | POA: Diagnosis not present

## 2018-11-08 DIAGNOSIS — R008 Other abnormalities of heart beat: Secondary | ICD-10-CM | POA: Insufficient documentation

## 2018-11-08 DIAGNOSIS — R0902 Hypoxemia: Secondary | ICD-10-CM | POA: Diagnosis not present

## 2018-11-08 DIAGNOSIS — R404 Transient alteration of awareness: Secondary | ICD-10-CM | POA: Diagnosis not present

## 2018-11-08 DIAGNOSIS — I493 Ventricular premature depolarization: Secondary | ICD-10-CM | POA: Diagnosis not present

## 2018-11-08 DIAGNOSIS — I5032 Chronic diastolic (congestive) heart failure: Secondary | ICD-10-CM | POA: Diagnosis not present

## 2018-11-08 DIAGNOSIS — R2689 Other abnormalities of gait and mobility: Secondary | ICD-10-CM | POA: Diagnosis not present

## 2018-11-08 DIAGNOSIS — I110 Hypertensive heart disease with heart failure: Secondary | ICD-10-CM | POA: Insufficient documentation

## 2018-11-08 DIAGNOSIS — M795 Residual foreign body in soft tissue: Secondary | ICD-10-CM

## 2018-11-08 DIAGNOSIS — I498 Other specified cardiac arrhythmias: Secondary | ICD-10-CM | POA: Diagnosis present

## 2018-11-08 DIAGNOSIS — R4182 Altered mental status, unspecified: Secondary | ICD-10-CM | POA: Diagnosis not present

## 2018-11-08 DIAGNOSIS — Z79899 Other long term (current) drug therapy: Secondary | ICD-10-CM | POA: Insufficient documentation

## 2018-11-08 LAB — BASIC METABOLIC PANEL
Anion gap: 7 (ref 5–15)
BUN: 15 mg/dL (ref 8–23)
CALCIUM: 8.5 mg/dL — AB (ref 8.9–10.3)
CHLORIDE: 109 mmol/L (ref 98–111)
CO2: 24 mmol/L (ref 22–32)
CREATININE: 0.88 mg/dL (ref 0.44–1.00)
GFR calc Af Amer: >60 mL/min (ref >60)
GFR calc non Af Amer: 58 mL/min — ABNORMAL LOW (ref >60)
Glucose, Bld: 113 mg/dL — ABNORMAL HIGH (ref 70–99)
Potassium: 4.8 mmol/L (ref 3.5–5.1)
SODIUM: 140 mmol/L (ref 135–145)

## 2018-11-08 LAB — CBC
HCT: 37.5 % (ref 36.0–46.0)
Hemoglobin: 11.6 g/dL — ABNORMAL LOW (ref 12.0–15.0)
MCH: 29.4 pg (ref 26.0–34.0)
MCHC: 30.9 g/dL (ref 30.0–36.0)
MCV: 95.2 fL (ref 80.0–100.0)
Platelets: 264 10*3/uL (ref 150–400)
RBC: 3.94 MIL/uL (ref 3.87–5.11)
RDW: 13.7 % (ref 11.5–15.5)
WBC: 7.3 10*3/uL (ref 4.0–10.5)
nRBC: 0 % (ref 0.0–0.2)

## 2018-11-08 MED ORDER — SODIUM CHLORIDE 0.9 % IV SOLN
INTRAVENOUS | Status: DC
  Administered 2018-11-08: via INTRAVENOUS

## 2018-11-08 NOTE — ED Triage Notes (Signed)
Pt. From [facility] and was visiting her family when she became unresponsive for approximately 2 minutes. Family helped her to the ground. Intial BP was 80/40. Given 1 L of fluid and repeat bp was 120/90. Pt. Is a/o x2 and at baseline.  CBG 170

## 2018-11-08 NOTE — ED Provider Notes (Addendum)
[facility] EMERGENCY DEPARTMENT Provider Note   CSN: [id number] Arrival date & time: 11/08/18  2206     History   Chief Complaint No chief complaint on file.   HPI Sheila Parrish is a 84 y.o. female.  Patient presents s/p syncopal event. Per family member, was at her house, and became unresponsive. C/o feeling faint/lightheaded immediately before, they were able to ease her to chair, no trauma/fall, where patient had loc for 1-2 minutes. No seizure activity or postictal period. Pt w hx dementia - current mental status c/w baseline - pt very poor historian - level 5 caveat. Pt w little recall of events. Patient denies chest pain. No sob. No palpitations. Denies blood loss, rectal bleeding or melena. No fever or chills. No headache.   The history is provided by the patient, a relative and the EMS personnel. The history is limited by the condition of the patient.    Past Medical History:  Diagnosis Date  . High cholesterol   . Scarlet fever 1936   Hospitalized for a month    Patient Active Problem List   Diagnosis Date Noted  . Near syncope 10/29/2016  . Orthostatic dizziness 10/29/2016  . Ventricular bigeminy 10/29/2016  . Back soft tissue injury 05/30/2013    Past Surgical History:  Procedure Laterality Date  . ABDOMINAL HYSTERECTOMY    . TOTAL KNEE ARTHROPLASTY Left 2010     OB History   None      Home Medications    Prior to Admission medications   Medication Sig Start Date End Date Taking? Authorizing Provider  Calcium Citrate-Vitamin D (CITRACAL + D PO) Take 2 tablets by mouth 2 (two) times daily. Take 2 tablets after breakfast and 2 tablets after lunch    [provider]  donepezil (ARICEPT) 10 MG tablet Take 1/2 tablet daily for 2 weeks, then increase to 1 tablet daily 07/14/18   Cameron Sprang, MD  escitalopram (LEXAPRO) 5 MG tablet TAKE 1 TABLET BY MOUTH EVERYDAY AT BEDTIME 08/15/18   Tat, Eustace Quail, DO  metoprolol tartrate  (LOPRESSOR) 25 MG tablet Take 0.5 tablets (12.5 mg total) by mouth 2 (two) times daily. 04/26/18   Consuelo Pandy, PA-C  Multiple Vitamin (MULTIVITAMIN WITH MINERALS) TABS Take 1 tablet by mouth daily.    [provider]    Family History Family History  Problem Relation Age of Onset  . Arthritis Mother        Died at 78  . Stroke Father 39       Died at age 54  . CAD Neg Hx     Social History Social History   Tobacco Use  . Smoking status: Never Smoker  . Smokeless tobacco: Never Used  Substance Use Topics  . Alcohol use: No  . Drug use: No     Allergies   Alendronate; Atorvastatin; Azithromycin; Bactrim [sulfamethoxazole-trimethoprim]; Macrodantin [nitrofurantoin macrocrystal]; Simvastatin; and Sulfa antibiotics   Review of Systems Review of Systems  Unable to perform ROS: Dementia  Constitutional: Negative for fever.  Cardiovascular: Negative for chest pain.  Neurological: Negative for headaches.  level 5 caveat - dementia   Physical Exam Updated Vital Signs BP 120/72   Pulse 78   Temp (!) 97.4 F (36.3 C) (Oral)   Resp 16   SpO2 92%   Physical Exam  Constitutional: She appears well-developed and well-nourished.  HENT:  Head: Atraumatic.  Mouth/Throat: Oropharynx is clear and moist.  Eyes: Pupils are equal, round,  and reactive to light. Conjunctivae are normal. No scleral icterus.  Neck: Neck supple. No tracheal deviation present. No thyromegaly present.  No bruits.   Cardiovascular: Normal rate, regular rhythm, normal heart sounds and intact distal pulses. Exam reveals no gallop and no friction rub.  No murmur heard. Pulmonary/Chest: Effort normal and breath sounds normal. No respiratory distress.  Abdominal: Soft. Normal appearance and bowel sounds are normal. She exhibits no distension. There is no tenderness.  No pulsatile mass.   Genitourinary:  Genitourinary Comments: No cva tenderness.  Musculoskeletal: She exhibits no edema.    CTLS spine, non tender, aligned, no step off. No focal tenderness on extremity exam.   Neurological: She is alert.  Awake and alert. Confused/dementia - mental status c/w baseline per family. Motor/sens grossly intact bil.   Skin: Skin is warm and dry. No rash noted.  Psychiatric: She has a normal mood and affect.  Nursing note and vitals reviewed.    ED Treatments / Results  Labs (all labs ordered are listed, but only abnormal results are displayed) Results for orders placed or performed during the hospital encounter of 61/44/31  Basic metabolic panel  Result Value Ref Range   Sodium 140 135 - 145 mmol/L   Potassium 4.8 3.5 - 5.1 mmol/L   Chloride 109 98 - 111 mmol/L   CO2 24 22 - 32 mmol/L   Glucose, Bld 113 (H) 70 - 99 mg/dL   BUN 15 8 - 23 mg/dL   Creatinine, Ser 0.88 0.44 - 1.00 mg/dL   Calcium 8.5 (L) 8.9 - 10.3 mg/dL   GFR calc non Af Amer 58 (L) >60 mL/min   GFR calc Af Amer >60 >60 mL/min   Anion gap 7 5 - 15  CBC  Result Value Ref Range   WBC 7.3 4.0 - 10.5 K/uL   RBC 3.94 3.87 - 5.11 MIL/uL   Hemoglobin 11.6 (L) 12.0 - 15.0 g/dL   HCT 37.5 36.0 - 46.0 %   MCV 95.2 80.0 - 100.0 fL   MCH 29.4 26.0 - 34.0 pg   MCHC 30.9 30.0 - 36.0 g/dL   RDW 13.7 11.5 - 15.5 %   Platelets 264 150 - 400 K/uL   nRBC 0.0 0.0 - 0.2 %   Ct Head Wo Contrast  Result Date: 11/08/2018 CLINICAL DATA:  Altered LOC EXAM: CT HEAD WITHOUT CONTRAST TECHNIQUE: Contiguous axial images were obtained from the base of the skull through the vertex without intravenous contrast. COMPARISON:  10/29/2016 head CT FINDINGS: Brain: No acute territorial infarction, hemorrhage, or intracranial mass is visualized. Moderate-to-marked atrophy. Moderate small vessel ischemic changes of the white matter. Stable ventricle size. Vascular: No hyperdense vessels.  Carotid vascular calcification Skull: Normal. Negative for fracture or focal lesion. Sinuses/Orbits: No acute finding. Other: None IMPRESSION: 1. No CT  evidence for acute intracranial abnormality. 2. Atrophy and small vessel ischemic changes of the white matter Electronically Signed   By: Donavan Foil M.D.   On: 11/08/2018 23:15    EKG EKG Interpretation  Date/Time:  Wednesday November 08 2018 22:16:23 EST Ventricular Rate:  87 PR Interval:    QRS Duration: 93 QT Interval:  310 QTC Calculation: 373 R Axis:   81 Text Interpretation:  Sinus rhythm Nonspecific T wave abnormality No significant change since last tracing Confirmed by Lajean Saver [phone]) on 11/08/2018 10:26:41 PM   Radiology Ct Head Wo Contrast  Result Date: 11/08/2018 CLINICAL DATA:  Altered LOC EXAM: CT HEAD WITHOUT CONTRAST TECHNIQUE: Contiguous axial  images were obtained from the base of the skull through the vertex without intravenous contrast. COMPARISON:  10/29/2016 head CT FINDINGS: Brain: No acute territorial infarction, hemorrhage, or intracranial mass is visualized. Moderate-to-marked atrophy. Moderate small vessel ischemic changes of the white matter. Stable ventricle size. Vascular: No hyperdense vessels.  Carotid vascular calcification Skull: Normal. Negative for fracture or focal lesion. Sinuses/Orbits: No acute finding. Other: None IMPRESSION: 1. No CT evidence for acute intracranial abnormality. 2. Atrophy and small vessel ischemic changes of the white matter Electronically Signed   By: Donavan Foil M.D.   On: 11/08/2018 23:15    Procedures Procedures (including critical care time)  Medications Ordered in ED Medications  0.9 %  sodium chloride infusion (has no administration in time range)     Initial Impression / Assessment and Plan / ED Course  I have reviewed the triage vital signs and the nursing notes.  Pertinent labs & imaging results that were available during my care of the patient were reviewed by me and considered in my medical decision making (see chart for details).  Iv ns. Labs. Continuous pulse ox and monitor. Ecg.   Reviewed  nursing notes and prior charts for additional history.   EMS has rhythm strip shortly after arrival whereby pt in bigeminy/frequent pvcs, no runs of vtach noted.   Labs reviewed - chem normal.  Ct reviewed - neg acute.   Given concern for possible cardiac syncope, will admit.  hospitalists consulted for admission. Discussed w Dr Hal Hope - will admit.     Final Clinical Impressions(s) / ED Diagnoses   Final diagnoses:  None    ED Discharge Orders    None         Lajean Saver, MD 11/08/18 2359

## 2018-11-09 ENCOUNTER — Observation Stay (HOSPITAL_COMMUNITY): Payer: Medicare PPO

## 2018-11-09 ENCOUNTER — Observation Stay (HOSPITAL_BASED_OUTPATIENT_CLINIC_OR_DEPARTMENT_OTHER): Payer: Medicare PPO

## 2018-11-09 ENCOUNTER — Other Ambulatory Visit: Payer: Self-pay

## 2018-11-09 ENCOUNTER — Encounter (HOSPITAL_COMMUNITY): Payer: Self-pay | Admitting: Internal Medicine

## 2018-11-09 DIAGNOSIS — Z01818 Encounter for other preprocedural examination: Secondary | ICD-10-CM | POA: Diagnosis not present

## 2018-11-09 DIAGNOSIS — I493 Ventricular premature depolarization: Secondary | ICD-10-CM | POA: Diagnosis not present

## 2018-11-09 DIAGNOSIS — I351 Nonrheumatic aortic (valve) insufficiency: Secondary | ICD-10-CM

## 2018-11-09 DIAGNOSIS — I340 Nonrheumatic mitral (valve) insufficiency: Secondary | ICD-10-CM | POA: Diagnosis not present

## 2018-11-09 DIAGNOSIS — Z135 Encounter for screening for eye and ear disorders: Secondary | ICD-10-CM | POA: Diagnosis not present

## 2018-11-09 DIAGNOSIS — I361 Nonrheumatic tricuspid (valve) insufficiency: Secondary | ICD-10-CM

## 2018-11-09 DIAGNOSIS — R55 Syncope and collapse: Secondary | ICD-10-CM | POA: Diagnosis not present

## 2018-11-09 LAB — CBC
HEMATOCRIT: 37.8 % (ref 36.0–46.0)
HEMATOCRIT: 37.9 % (ref 36.0–46.0)
HEMOGLOBIN: 12.1 g/dL (ref 12.0–15.0)
Hemoglobin: 12.1 g/dL (ref 12.0–15.0)
MCH: 29.4 pg (ref 26.0–34.0)
MCH: 29.5 pg (ref 26.0–34.0)
MCHC: 31.9 g/dL (ref 30.0–36.0)
MCHC: 32 g/dL (ref 30.0–36.0)
MCV: 92 fL (ref 80.0–100.0)
MCV: 92.4 fL (ref 80.0–100.0)
PLATELETS: 267 10*3/uL (ref 150–400)
Platelets: 271 10*3/uL (ref 150–400)
RBC: 4.11 MIL/uL (ref 3.87–5.11)
RBC: 4.1 MIL/uL (ref 3.87–5.11)
RDW: 13.8 % (ref 11.5–15.5)
RDW: 14 % (ref 11.5–15.5)
WBC: 9.6 10*3/uL (ref 4.0–10.5)
WBC: 7 10*3/uL (ref 4.0–10.5)
nRBC: 0 % (ref 0.0–0.2)
nRBC: 0 % (ref 0.0–0.2)

## 2018-11-09 LAB — URINE CULTURE: CULTURE: NO GROWTH

## 2018-11-09 LAB — URINALYSIS, ROUTINE W REFLEX MICROSCOPIC
Bilirubin Urine: NEGATIVE
GLUCOSE, UA: NEGATIVE mg/dL
HGB URINE DIPSTICK: NEGATIVE
KETONES UR: NEGATIVE mg/dL
Nitrite: POSITIVE — AB
PH: 7 (ref 5.0–8.0)
Protein, ur: NEGATIVE mg/dL
Specific Gravity, Urine: 1.008 (ref 1.005–1.030)

## 2018-11-09 LAB — BASIC METABOLIC PANEL
Anion gap: 7 (ref 5–15)
BUN: 10 mg/dL (ref 8–23)
CO2: 21 mmol/L — ABNORMAL LOW (ref 22–32)
Calcium: 8.6 mg/dL — ABNORMAL LOW (ref 8.9–10.3)
Chloride: 112 mmol/L — ABNORMAL HIGH (ref 98–111)
Creatinine, Ser: 0.88 mg/dL (ref 0.44–1.00)
GFR calc Af Amer: >60 mL/min (ref >60)
GFR calc non Af Amer: 58 mL/min — ABNORMAL LOW (ref >60)
GLUCOSE: 142 mg/dL — AB (ref 70–99)
Potassium: 4 mmol/L (ref 3.5–5.1)
SODIUM: 140 mmol/L (ref 135–145)

## 2018-11-09 LAB — CREATININE, SERUM
Creatinine, Ser: 0.96 mg/dL (ref 0.44–1.00)
GFR calc non Af Amer: 52 mL/min — ABNORMAL LOW (ref >60)

## 2018-11-09 LAB — ECHOCARDIOGRAM COMPLETE
Height: 61 in
WEIGHTICAEL: 2158.7443 [oz_av]

## 2018-11-09 MED ORDER — ESCITALOPRAM OXALATE 10 MG PO TABS: 5.0000 mg | ORAL_TABLET | Freq: Every day | ORAL | Status: DC

## 2018-11-09 MED ORDER — ONDANSETRON HCL 4 MG/2ML IJ SOLN: 4.0000 mg | Freq: Four times a day (QID) | INTRAMUSCULAR | Status: DC | PRN

## 2018-11-09 MED ORDER — HALOPERIDOL LACTATE 5 MG/ML IJ SOLN: 1.0000 mg | Freq: Four times a day (QID) | INTRAMUSCULAR | Status: DC | PRN

## 2018-11-09 MED ORDER — SODIUM CHLORIDE 0.9 % IV SOLN
INTRAVENOUS | Status: DC
  Administered 2018-11-09: 01:00:00 via INTRAVENOUS

## 2018-11-09 MED ORDER — METOPROLOL TARTRATE 12.5 MG HALF TABLET
12.5000 mg | ORAL_TABLET | Freq: Two times a day (BID) | ORAL | Status: DC
  Filled 2018-11-09: qty 1

## 2018-11-09 MED ORDER — QUETIAPINE FUMARATE 25 MG PO TABS: 25.0000 mg | ORAL_TABLET | Freq: Every evening | ORAL | Status: DC | PRN

## 2018-11-09 MED ORDER — ACETAMINOPHEN 650 MG RE SUPP: 650.0000 mg | Freq: Four times a day (QID) | RECTAL | Status: DC | PRN

## 2018-11-09 MED ORDER — ONDANSETRON HCL 4 MG PO TABS: 4.0000 mg | ORAL_TABLET | Freq: Four times a day (QID) | ORAL | Status: DC | PRN

## 2018-11-09 MED ORDER — SODIUM CHLORIDE 0.9 % IV SOLN
1.0000 g | Freq: Every day | INTRAVENOUS | Status: DC
  Administered 2018-11-09: 1 g via INTRAVENOUS
  Filled 2018-11-09 (×2): qty 10

## 2018-11-09 MED ORDER — ENOXAPARIN SODIUM 40 MG/0.4ML ~~LOC~~ SOLN
40.0000 mg | Freq: Every day | SUBCUTANEOUS | Status: DC
  Administered 2018-11-09: 40 mg via SUBCUTANEOUS
  Filled 2018-11-09 (×2): qty 0.4

## 2018-11-09 MED ORDER — ACETAMINOPHEN 325 MG PO TABS: 650.0000 mg | ORAL_TABLET | Freq: Four times a day (QID) | ORAL | Status: DC | PRN

## 2018-11-09 NOTE — Consult Note (Signed)
Admit date: 11/08/2018 Referring Physician  Dr. Venetia Constable Primary Physician  Dr. Lavone Orn Primary Cardiologist  Dr. Fransico Him Reason for Consultation  syncope  HPI: Sheila Parrish is a 84 y.o. female who is being seen today for the evaluation of syncope at the request of Dr. Olevia Bowens.  This is an 84 year old female known to me with a history of diastolic heart failure, PVCs, hyperlipidemia and remote scarlet fever.  We are being asked to consult for syncope.  She has a history of syncope back in 2017 at that time admission revealed negative orthostatic blood pressures and negative head CT.  She was noted at that time to have PVCs and ventricular bigeminy on telemetry.  Cardiac enzymes were negative and echo showed an EF of 55 to 60% with mild diastolic dysfunction.  She was started on low-dose beta-blocker.  She was discharged home with 30-day monitor at that time which showed normal sinus rhythm with one episode of atrial tachycardia for 5 beats.  She was seen back in the office in January 2018 and was doing well.  Exam was normal.  She was opposed to follow-up in 6 months but failed to show up.  She was seen back in the office on 04/26/2018 by my extender Sheila Parrish, [place].  Is living in assisted living and has been doing well without any problems.  She was taking metoprolol 12.5 mg twice daily.  No medication changes were made.  Apparently the night before admission to the ER she was in her usual state of health having dinner with her family and she became weak and dizzy while walking and started to slump down and family members caught her and eased her into her recliner chair.  She was incontinent of urine but had no tongue biting.  There was also some evidence of right hand weakness and a shaking spell that lasted a few seconds.  Upon EMS arrival she had regained consciousness but was noted to be hypotensive.  She was given a fluid bolus in the ER.  EKG was normal in the ER.  Head CT was  nonfocal.   PMH:   Past Medical History:  Diagnosis Date  . High cholesterol   . Scarlet fever 1936   Hospitalized for a month     PSH:   Past Surgical History:  Procedure Laterality Date  . ABDOMINAL HYSTERECTOMY    . TOTAL KNEE ARTHROPLASTY Left 2010    Allergies:  Alendronate; Atorvastatin; Azithromycin; Bactrim [sulfamethoxazole-trimethoprim]; Macrodantin [nitrofurantoin macrocrystal]; Simvastatin; and Sulfa antibiotics Prior to Admit Meds:   Medications Prior to Admission  Medication Sig Dispense Refill Last Dose  . Calcium Citrate-Vitamin D (CITRACAL + D PO) Take 2 tablets by mouth 2 (two) times daily. Take 2 tablets after breakfast and 2 tablets after lunch   Taking  . donepezil (ARICEPT) 10 MG tablet Take 1/2 tablet daily for 2 weeks, then increase to 1 tablet daily 90 tablet 3   . escitalopram (LEXAPRO) 5 MG tablet TAKE 1 TABLET BY MOUTH EVERYDAY AT BEDTIME 90 tablet 1   . metoprolol tartrate (LOPRESSOR) 25 MG tablet Take 0.5 tablets (12.5 mg total) by mouth 2 (two) times daily. 90 tablet 2 Taking  . Multiple Vitamin (MULTIVITAMIN WITH MINERALS) TABS Take 1 tablet by mouth daily.   Taking   Fam HX:    Family History  Problem Relation Age of Onset  . Arthritis Mother        Died at 54  . Stroke Father  87       Died at age 77  . CAD Neg Hx    Social HX:    Social History   Socioeconomic History  . Marital status: Married    Spouse name: Not on file  . Number of children: Not on file  . Years of education: Not on file  . Highest education level: Not on file  Occupational History  . Occupation: Retired  Scientific laboratory technician  . Financial resource strain: Not on file  . Food insecurity:    Worry: Not on file    Inability: Not on file  . Transportation needs:    Medical: Not on file    Non-medical: Not on file  Tobacco Use  . Smoking status: Never Smoker  . Smokeless tobacco: Never Used  Substance and Sexual Activity  . Alcohol use: No  . Drug use: No  . Sexual  activity: Not on file  Lifestyle  . Physical activity:    Days per week: Not on file    Minutes per session: Not on file  . Stress: Not on file  Relationships  . Social connections:    Talks on phone: Not on file    Gets together: Not on file    Attends religious service: Not on file    Active member of club or organization: Not on file    Attends meetings of clubs or organizations: Not on file    Relationship status: Not on file  . Intimate partner violence:    Fear of current or ex partner: Not on file    Emotionally abused: Not on file    Physically abused: Not on file    Forced sexual activity: Not on file  Other Topics Concern  . Not on file  Social History Narrative   Lives in 1 story apartment on the 2nd floor, with her husband   Has 3 adult children   4 year degree   Retired 1st grade teacher     ROS:  All  ROS were addressed and are negative except what is stated in the HPI  Physical Exam: Blood pressure (!) 154/75, pulse 84, temperature 99.5 F (37.5 C), temperature source Oral, resp. rate 17, height 5\' 1"  (1.549 m), weight 61.2 kg, SpO2 96 %.    General: Well developed, well nourished, in no acute distress Head: Eyes PERRLA, No xanthomas.   Normal cephalic and atramatic  Lungs:   Clear bilaterally to auscultation and percussion. Heart:   HRRR S1 S2 Pulses are 2+ & equal.            No carotid bruit. No JVD.  No abdominal bruits. No femoral bruits. Abdomen: Bowel sounds are positive, abdomen soft and non-tender without masses or                  Hernia's noted. Msk:  Back normal, normal gait. Normal strength and tone for age. Extremities:   No clubbing, cyanosis or edema.  DP +1 Neuro: Alert and oriented X 3. Psych:  Good affect, responds appropriately    Labs:   Lab Results  Component Value Date   WBC 7.0 11/09/2018   HGB 12.1 11/09/2018   HCT 37.9 11/09/2018   MCV 92.4 11/09/2018   PLT 271 11/09/2018    Recent Labs  Lab 11/09/18 0557  NA 140    K 4.0  CL 112*  CO2 21*  BUN 10  CREATININE 0.88  CALCIUM 8.6*  GLUCOSE 142*   No results  found for: PTT Lab Results  Component Value Date   INR 3.4 (H) 04/18/2009   INR 2.8 (H) 04/17/2009   INR 2.5 (H) 04/16/2009   Lab Results  Component Value Date   CKTOTAL 103 04/17/2009   CKMB 2.6 04/17/2009   TROPONINI <0.03 10/30/2016    No results found for: CHOL No results found for: HDL No results found for: LDLCALC No results found for: TRIG No results found for: CHOLHDL No results found for: LDLDIRECT    Radiology:  Dg Orbits  Result Date: 11/09/2018 CLINICAL DATA:  Pre MRI clearance. EXAM: ORBITS - COMPLETE 4+ VIEW COMPARISON:  Head CT yesterday. FINDINGS: There is no evidence of metallic foreign body within the orbits. No significant bone abnormality identified. IMPRESSION: No evidence of metallic foreign body within the orbits. Electronically Signed   By: Keith Rake M.D.   On: 11/09/2018 03:09   Dg Chest 1 View  Result Date: 11/09/2018 CLINICAL DATA:  Pre MRI clearance. EXAM: CHEST  1 VIEW COMPARISON:  Chest radiograph 07/08/2017 FINDINGS: No radiopaque foreign body in the thorax. Heart is normal in size. Mild aortic tortuosity. No acute airspace disease, pleural effusion or pneumothorax. IMPRESSION: 1. No metallic foreign body to preclude MRI imaging. 2. No acute chest finding. Electronically Signed   By: Keith Rake M.D.   On: 11/09/2018 03:10   Dg Abd 1 View  Result Date: 11/09/2018 CLINICAL DATA:  Pre MRI clearance. EXAM: ABDOMEN - 1 VIEW COMPARISON:  None. FINDINGS: No radiopaque foreign bodies. No bowel dilatation or obstruction. Right colon interposed under the right hemidiaphragm. No evidence of free air. No radiopaque calculi. Scoliotic curvature in degenerative change in the spine. IMPRESSION: 1. No radiopaque foreign body. 2. No acute findings. Electronically Signed   By: Keith Rake M.D.   On: 11/09/2018 03:11   Ct Head Wo Contrast  Result Date:  11/08/2018 CLINICAL DATA:  Altered LOC EXAM: CT HEAD WITHOUT CONTRAST TECHNIQUE: Contiguous axial images were obtained from the base of the skull through the vertex without intravenous contrast. COMPARISON:  10/29/2016 head CT FINDINGS: Brain: No acute territorial infarction, hemorrhage, or intracranial mass is visualized. Moderate-to-marked atrophy. Moderate small vessel ischemic changes of the white matter. Stable ventricle size. Vascular: No hyperdense vessels.  Carotid vascular calcification Skull: Normal. Negative for fracture or focal lesion. Sinuses/Orbits: No acute finding. Other: None IMPRESSION: 1. No CT evidence for acute intracranial abnormality. 2. Atrophy and small vessel ischemic changes of the white matter Electronically Signed   By: Donavan Foil M.D.   On: 11/08/2018 23:15   Mr Brain Wo Contrast  Result Date: 11/09/2018 CLINICAL DATA:  Initial evaluation for acute syncope. EXAM: MRI HEAD WITHOUT CONTRAST TECHNIQUE: Multiplanar, multiecho pulse sequences of the brain and surrounding structures were obtained without intravenous contrast. COMPARISON:  Prior CT from 11/08/2018. FINDINGS: Brain: Moderately advanced cerebral atrophy with chronic small vessel ischemic disease. No acute intracranial hemorrhage. No abnormal foci of restricted diffusion to suggest acute or subacute ischemia. Gray-white matter differentiation maintained. No encephalomalacia to suggest chronic cortical infarction. No acute intracranial hemorrhage. Few chronic micro hemorrhages noted within the right cerebral and cerebellar hemispheres. No mass lesion, midline shift or mass effect. No hydrocephalus. No extra-axial fluid collection. Pituitary gland normal. Vascular: Major intracranial vascular flow voids maintained. Skull and upper cervical spine: Craniocervical junction normal. Mild multilevel cervical spondylolysis noted within the upper cervical spine without significant stenosis. Bone marrow signal intensity within  normal limits. No scalp soft tissue abnormality. Sinuses/Orbits: Globes and orbital  soft tissues within normal limits. Patient status post bilateral ocular lens replacement. Paranasal sinuses are clear. No mastoid effusion. Inner ear structures normal. Other: None. IMPRESSION: 1. No acute intracranial abnormality. 2. Moderately advanced cerebral atrophy with chronic small vessel ischemic disease. Electronically Signed   By: Jeannine Boga M.D.   On: 11/09/2018 04:11     Telemetry    NSR - Personally Reviewed  ECG    Normal sinus rhythm with QTC 373 ms with nonspecific ST abnormality.- Personally Reviewed   ASSESSMENT/PLAN:   1.  Syncope -Etiology possibly orthostatic hypotension as her blood pressure was low upon EMS arrival but orthostatics in ER after a liter of fluid were normal. -Also need to consider seizure since she had urinary incontinence and had witnessed shaking by family members and also had arm weakness -Neuro consulted and EEG and MRI of the brain are ordered -EKG has been normal with nonspecific ST abnormality and normal QTC -She has not been bradycardic on telemetry despite being on Lopressor. -Will heck 2D echocardiogram to assess LV function -No symptoms to suggest coronary ischemia KG is normal -Continue to follow on telemetry and if no arrhythmias then repeat 30-day event monitor.  If this is negative may need to consider loop recorder. -Add compression hose for possible orthostatic hypotension  2.  PVCs  -Continue low-dose beta-blocker  3.  Hypertension -BP is elevated today at 154/75 - 171/66 mmHg was low for some time after admission -Continue metoprolol 12.5 mg twice daily -would not use diuretics to treat high blood pressure due to potential for dehydration orthostatic hypotension -Consider addition of ACE inhibitor or amlodipine if needed  4.  UTI -Antibiotics per TRH  5.  Depression -Continue Lexapro  6.  Dementia -Unable to tolerate  Aricept   Fransico Him, MD  11/09/2018  8:56 AM

## 2018-11-09 NOTE — Progress Notes (Signed)
Pt transported to xray 

## 2018-11-09 NOTE — Progress Notes (Signed)
  Echocardiogram 2D Echocardiogram has been performed.  Dex Blakely T Tonni Mansour 11/09/2018, 10:49 AM

## 2018-11-09 NOTE — H&P (Signed)
History and Physical    Sheila Parrish TDD:[id number] DOB: 1927/06/24 DOA: 11/08/2018  PCP: Lavone Orn, MD  Patient coming from: Home.  Chief Complaint: Loss of consciousness.  HPI: Sheila Parrish is a 84 y.o. female with history of PVCs and dementia with depression and diastolic dysfunction who is on metoprolol for PVCs had dinner with her family last night at her daughter's house and while walking felt slightly weak and dizzy and slumped before landing onto the floor patient's family eased her onto the recliner.  During the episode patient had incontinence of urine but did not have any tongue bite.  Family noticed that patient's right hand was weak and had a shaking spell lasted for few seconds.  After EMS came patient regained consciousness spontaneously but was found to be hypotensive.  ED Course: In the ER patient was given fluid bolus EKG shows normal sinus rhythm with QTC of 360 ms.  Patient appeared nonfocal CT head was unremarkable.  Patient is being admitted for further management of syncope.  Review of Systems: As per HPI, rest all negative.   Past Medical History:  Diagnosis Date  . High cholesterol   . Scarlet fever 1936   Hospitalized for a month    Past Surgical History:  Procedure Laterality Date  . ABDOMINAL HYSTERECTOMY    . TOTAL KNEE ARTHROPLASTY Left 2010     reports that she has never smoked. She has never used smokeless tobacco. She reports that she does not drink alcohol or use drugs.  Allergies  Allergen Reactions  . Alendronate     Difficulty swallowing  . Atorvastatin     Leg cramping  . Azithromycin   . Bactrim [Sulfamethoxazole-Trimethoprim] Other (See Comments)    Reaction unknown  . Macrodantin [Nitrofurantoin Macrocrystal]   . Simvastatin     Leg cramping  . Sulfa Antibiotics Other (See Comments)    Reaction unknown    Family History  Problem Relation Age of Onset  . Arthritis Mother        Died at 56  . Stroke Father 39   Died at age 74  . CAD Neg Hx     Prior to Admission medications   Medication Sig Start Date End Date Taking? Authorizing Provider  Calcium Citrate-Vitamin D (CITRACAL + D PO) Take 2 tablets by mouth 2 (two) times daily. Take 2 tablets after breakfast and 2 tablets after lunch    [provider]  donepezil (ARICEPT) 10 MG tablet Take 1/2 tablet daily for 2 weeks, then increase to 1 tablet daily 07/14/18   Cameron Sprang, MD  escitalopram (LEXAPRO) 5 MG tablet TAKE 1 TABLET BY MOUTH EVERYDAY AT BEDTIME 08/15/18   Tat, Eustace Quail, DO  metoprolol tartrate (LOPRESSOR) 25 MG tablet Take 0.5 tablets (12.5 mg total) by mouth 2 (two) times daily. 04/26/18   Consuelo Pandy, PA-C  Multiple Vitamin (MULTIVITAMIN WITH MINERALS) TABS Take 1 tablet by mouth daily.    [provider]    Physical Exam: Vitals:   11/08/18 2217 11/08/18 2245 11/08/18 2315 11/08/18 2345  BP: (!) 146/74 (!) 128/46 120/72 (!) 126/54  Pulse: 83 83 78 78  Resp: 18 18 16 14   Temp:      TempSrc:      SpO2: 93% 98% 92% 97%      Constitutional: Moderately built and nourished. Vitals:   11/08/18 2217 11/08/18 2245 11/08/18 2315 11/08/18 2345  BP: (!) 146/74 (!) 128/46 120/72 (!) 126/54  Pulse:  83 83 78 78  Resp: 18 18 16 14   Temp:      TempSrc:      SpO2: 93% 98% 92% 97%   Eyes: Anicteric no pallor. ENMT: No discharge from the ears eyes nose or mouth. Neck: No mass felt.  No neck rigidity. Respiratory: No rhonchi or crepitations. Cardiovascular: S1-S2 heard no murmurs appreciated. Abdomen: Soft nontender bowel sounds present. Musculoskeletal: No edema.  No joint effusion. Skin: No rash. Neurologic: Alert awake oriented to name and place.  Moves all extremities 5 x 5. Psychiatric: Has mild dementia.   Labs on Admission: I have personally reviewed following labs and imaging studies  CBC: Recent Labs  Lab 11/08/18 2225  WBC 7.3  HGB 11.6*  HCT 37.5  MCV 95.2  PLT 546   Basic Metabolic  Panel: Recent Labs  Lab 11/08/18 2225  NA 140  K 4.8  CL 109  CO2 24  GLUCOSE 113*  BUN 15  CREATININE 0.88  CALCIUM 8.5*   GFR: CrCl cannot be calculated (Unknown ideal weight.). Liver Function Tests: No results for input(s): AST, ALT, ALKPHOS, BILITOT, PROT, ALBUMIN in the last 168 hours. No results for input(s): LIPASE, AMYLASE in the last 168 hours. No results for input(s): AMMONIA in the last 168 hours. Coagulation Profile: No results for input(s): INR, PROTIME in the last 168 hours. Cardiac Enzymes: No results for input(s): CKTOTAL, CKMB, CKMBINDEX, TROPONINI in the last 168 hours. BNP (last 3 results) Recent Labs    04/26/18 1638  PROBNP 366   HbA1C: No results for input(s): HGBA1C in the last 72 hours. CBG: No results for input(s): GLUCAP in the last 168 hours. Lipid Profile: No results for input(s): CHOL, HDL, LDLCALC, TRIG, CHOLHDL, LDLDIRECT in the last 72 hours. Thyroid Function Tests: No results for input(s): TSH, T4TOTAL, FREET4, T3FREE, THYROIDAB in the last 72 hours. Anemia Panel: No results for input(s): VITAMINB12, FOLATE, FERRITIN, TIBC, IRON, RETICCTPCT in the last 72 hours. Urine analysis:    Component Value Date/Time   COLORURINE YELLOW 04/07/2009 [place] 04/07/2009 1413   LABSPEC 1.019 04/07/2009 1413   PHURINE 6.5 04/07/2009 1413   GLUCOSEU NEGATIVE 04/07/2009 [place] 04/07/2009 [place] 04/07/2009 1413   KETONESUR NEGATIVE 04/07/2009 1413   PROTEINUR NEGATIVE 04/07/2009 1413   UROBILINOGEN 1.0 04/07/2009 1413   NITRITE NEGATIVE 04/07/2009 1413   LEUKOCYTESUR SMALL (A) 04/07/2009 1413   Sepsis Labs: @LABRCNTIP (procalcitonin:4,lacticidven:4) )No results found for this or any previous visit (from the past 240 hour(s)).   Radiological Exams on Admission: Ct Head Wo Contrast  Result Date: 11/08/2018 CLINICAL DATA:  Altered LOC EXAM: CT HEAD WITHOUT CONTRAST TECHNIQUE: Contiguous axial  images were obtained from the base of the skull through the vertex without intravenous contrast. COMPARISON:  10/29/2016 head CT FINDINGS: Brain: No acute territorial infarction, hemorrhage, or intracranial mass is visualized. Moderate-to-marked atrophy. Moderate small vessel ischemic changes of the white matter. Stable ventricle size. Vascular: No hyperdense vessels.  Carotid vascular calcification Skull: Normal. Negative for fracture or focal lesion. Sinuses/Orbits: No acute finding. Other: None IMPRESSION: 1. No CT evidence for acute intracranial abnormality. 2. Atrophy and small vessel ischemic changes of the white matter Electronically Signed   By: Donavan Foil M.D.   On: 11/08/2018 23:15    EKG: Independently reviewed.  Normal sinus rhythm with QTC of 360 ms.  Assessment/Plan Principal Problem:   Syncope and collapse Active Problems:   Ventricular bigeminy  1. Syncope -cause not clear.  Patient was hypotensive when the incident happened.  We will keep patient on gentle hydration.  Patient is not orthostatic in the ER.  Since patient had incontinence of urine and shaking spell of the right upper extremity discussed with neurologist who advised to get EEG and MRI brain.  Will monitor in telemetry requested cardiology consult.  Last 2D echo done was in November 2017 showed EF of 55 to 60%. 2. History of PVCs on metoprolol.  Which will be continued. 3. History of dementia Aricept was tried but was not able to tolerate and it was discontinued.  Presently no acute issues. 4. Depression on Lexapro. 5. UTI on ceftriaxone follow urine cultures.   DVT prophylaxis: Lovenox. Code Status: Full code. Family Communication: Patient's daughter. Disposition Plan: Back to independent living facility. Consults called: Cardiology.  Discussed with neurologist. Admission status: Observation.   Rise Patience MD [facility] Pager [phone].  If 7PM-7AM, please contact  night-coverage [email or web address] Password [email or web address]  11/09/2018, 12:10 AM

## 2018-11-09 NOTE — Progress Notes (Signed)
[facility] PROGRESS NOTE    Progress Note  Sheila Parrish  OEU:[id number] DOB: 09/29/1927 DOA: 11/08/2018 PCP: Lavone Orn, MD     Brief Narrative:   Sheila Parrish is an 84 y.o. female past medical history of PVCs, dementia, chronic diastolic heart failure who was having dinner with family felt weak while walking, dizzy slumped over before her family is dropped to the floor.,  She lost control of sphincters, but the patient noticed that the family had a right hand weakness and shaking spells.  Assessment/Plan:   Syncope and collapse Unclear etiology, she was hypotensive.  She was started on gentle hydration. Orthostatics were checked which were negative in the ED. She did not have a shaking episode she did have a little bit of incontinence, she was not postictal, after EMS got there and she was started on IV fluids she regained consciousness.  He is really concerned about an EEG as they do not think she will tolerated.  We will go ahead and cancel it I have discussed the risk and benefits of doing this with them.  MRI showed no acute CVA, she has had no events on telemetry Show significant white blood cell count positive for nitrates.  She was started empirically on Rocephin I agree with current management.  Continue IV Rocephin culture data is pending. No events on telemetry.  Ventricular bigeminy/frequent PVCs Continue beta-blockers.  History of dementia: She cannot tolerate at home presently no issues.  Depression: Continue Lexapro.  Possible UTI: Empirically on IV Rocephin urine cultures pending.   DVT prophylaxis: lovenxo Family Communication:daughter Disposition Plan/Barrier to D/C: home in 1 day Code Status:     Code Status Orders  (From admission, onward)         Start     Ordered   11/09/18 0010  Full code  Continuous     11/09/18 0009        Code Status History    Date Active Date Inactive Code Status Order ID Comments User Context   10/29/2016 1642 10/31/2016 1552 Full Code [id number]  Barrett, Evelene Croon, PA-C Inpatient    Advance Directive Documentation     Most Recent Value  Type of Advance Directive  Healthcare Power of [place], Living will  Pre-existing out of facility DNR order (yellow form or pink MOST form)  -  "MOST" Form in Place?  -        IV Access:    Peripheral IV   Procedures and diagnostic studies:   Dg Orbits  Result Date: 11/09/2018 CLINICAL DATA:  Pre MRI clearance. EXAM: ORBITS - COMPLETE 4+ VIEW COMPARISON:  Head CT yesterday. FINDINGS: There is no evidence of metallic foreign body within the orbits. No significant bone abnormality identified. IMPRESSION: No evidence of metallic foreign body within the orbits. Electronically Signed   By: Keith Rake M.D.   On: 11/09/2018 03:09   Dg Chest 1 View  Result Date: 11/09/2018 CLINICAL DATA:  Pre MRI clearance. EXAM: CHEST  1 VIEW COMPARISON:  Chest radiograph 07/08/2017 FINDINGS: No radiopaque foreign body in the thorax. Heart is normal in size. Mild aortic tortuosity. No acute airspace disease, pleural effusion or pneumothorax. IMPRESSION: 1. No metallic foreign body to preclude MRI imaging. 2. No acute chest finding. Electronically Signed   By: Keith Rake M.D.   On: 11/09/2018 03:10   Dg Abd 1 View  Result Date: 11/09/2018 CLINICAL DATA:  Pre MRI clearance. EXAM: ABDOMEN - 1 VIEW COMPARISON:  None. FINDINGS: No  radiopaque foreign bodies. No bowel dilatation or obstruction. Right colon interposed under the right hemidiaphragm. No evidence of free air. No radiopaque calculi. Scoliotic curvature in degenerative change in the spine. IMPRESSION: 1. No radiopaque foreign body. 2. No acute findings. Electronically Signed   By: Keith Rake M.D.   On: 11/09/2018 03:11   Ct Head Wo Contrast  Result Date: 11/08/2018 CLINICAL DATA:  Altered LOC EXAM: CT HEAD WITHOUT CONTRAST TECHNIQUE: Contiguous axial images were obtained from the base of  the skull through the vertex without intravenous contrast. COMPARISON:  10/29/2016 head CT FINDINGS: Brain: No acute territorial infarction, hemorrhage, or intracranial mass is visualized. Moderate-to-marked atrophy. Moderate small vessel ischemic changes of the white matter. Stable ventricle size. Vascular: No hyperdense vessels.  Carotid vascular calcification Skull: Normal. Negative for fracture or focal lesion. Sinuses/Orbits: No acute finding. Other: None IMPRESSION: 1. No CT evidence for acute intracranial abnormality. 2. Atrophy and small vessel ischemic changes of the white matter Electronically Signed   By: Donavan Foil M.D.   On: 11/08/2018 23:15   Mr Brain Wo Contrast  Result Date: 11/09/2018 CLINICAL DATA:  Initial evaluation for acute syncope. EXAM: MRI HEAD WITHOUT CONTRAST TECHNIQUE: Multiplanar, multiecho pulse sequences of the brain and surrounding structures were obtained without intravenous contrast. COMPARISON:  Prior CT from 11/08/2018. FINDINGS: Brain: Moderately advanced cerebral atrophy with chronic small vessel ischemic disease. No acute intracranial hemorrhage. No abnormal foci of restricted diffusion to suggest acute or subacute ischemia. Gray-white matter differentiation maintained. No encephalomalacia to suggest chronic cortical infarction. No acute intracranial hemorrhage. Few chronic micro hemorrhages noted within the right cerebral and cerebellar hemispheres. No mass lesion, midline shift or mass effect. No hydrocephalus. No extra-axial fluid collection. Pituitary gland normal. Vascular: Major intracranial vascular flow voids maintained. Skull and upper cervical spine: Craniocervical junction normal. Mild multilevel cervical spondylolysis noted within the upper cervical spine without significant stenosis. Bone marrow signal intensity within normal limits. No scalp soft tissue abnormality. Sinuses/Orbits: Globes and orbital soft tissues within normal limits. Patient status post  bilateral ocular lens replacement. Paranasal sinuses are clear. No mastoid effusion. Inner ear structures normal. Other: None. IMPRESSION: 1. No acute intracranial abnormality. 2. Moderately advanced cerebral atrophy with chronic small vessel ischemic disease. Electronically Signed   By: Jeannine Boga M.D.   On: 11/09/2018 04:11     Medical Consultants:    None.  Anti-Infectives:   Rocephin  Subjective:    Sheila Parrish she has no new complaints daughter at bedside which provides most of the history.  Objective:    Vitals:   11/09/18 0129 11/09/18 0131 11/09/18 0602 11/09/18 0806  BP: (!) 162/74  (!) 171/66 (!) 154/75  Pulse: 94  95 84  Resp: 20  18 17   Temp: 97.7 F (36.5 C)  98.5 F (36.9 C) 99.5 F (37.5 C)  TempSrc: Oral  Oral Oral  SpO2:   94% 96%  Weight:  61.2 kg    Height: 5\' 1"  (1.549 m)       Intake/Output Summary (Last 24 hours) at 11/09/2018 0910 Last data filed at 11/09/2018 0529 Gross per 24 hour  Intake 372.64 ml  Output -  Net 372.64 ml   Filed Weights   11/09/18 0131  Weight: 61.2 kg    Exam: General exam: In no acute distress. Respiratory system: Good air movement and clear to auscultation. Cardiovascular system: S1 & S2 heard, RRR.  Gastrointestinal system: Abdomen is nondistended, soft and nontender.  Central nervous system: Alert  and oriented. No focal neurological deficits. Extremities: No pedal edema. Skin: No rashes, lesions or ulcers Psychiatry: Judgement and insight appear normal. Mood & affect appropriate.    Data Reviewed:    Labs: Basic Metabolic Panel: Recent Labs  Lab 11/08/18 2225 11/09/18 0050 11/09/18 0557  NA 140  --  140  K 4.8  --  4.0  CL 109  --  112*  CO2 24  --  21*  GLUCOSE 113*  --  142*  BUN 15  --  10  CREATININE 0.88 0.96 0.88  CALCIUM 8.5*  --  8.6*   GFR Estimated Creatinine Clearance: 36.4 mL/min (by C-G formula based on SCr of 0.88 mg/dL). Liver Function Tests: No results for  input(s): AST, ALT, ALKPHOS, BILITOT, PROT, ALBUMIN in the last 168 hours. No results for input(s): LIPASE, AMYLASE in the last 168 hours. No results for input(s): AMMONIA in the last 168 hours. Coagulation profile No results for input(s): INR, PROTIME in the last 168 hours.  CBC: Recent Labs  Lab 11/08/18 2225 11/09/18 0050 11/09/18 0404  WBC 7.3 9.6 7.0  HGB 11.6* 12.1 12.1  HCT 37.5 37.8 37.9  MCV 95.2 92.0 92.4  PLT 264 267 271   Cardiac Enzymes: No results for input(s): CKTOTAL, CKMB, CKMBINDEX, TROPONINI in the last 168 hours. BNP (last 3 results) Recent Labs    04/26/18 1638  PROBNP 366   CBG: No results for input(s): GLUCAP in the last 168 hours. D-Dimer: No results for input(s): DDIMER in the last 72 hours. Hgb A1c: No results for input(s): HGBA1C in the last 72 hours. Lipid Profile: No results for input(s): CHOL, HDL, LDLCALC, TRIG, CHOLHDL, LDLDIRECT in the last 72 hours. Thyroid function studies: No results for input(s): TSH, T4TOTAL, T3FREE, THYROIDAB in the last 72 hours.  Invalid input(s): FREET3 Anemia work up: No results for input(s): VITAMINB12, FOLATE, FERRITIN, TIBC, IRON, RETICCTPCT in the last 72 hours. Sepsis Labs: Recent Labs  Lab 11/08/18 2225 11/09/18 0050 11/09/18 0404  WBC 7.3 9.6 7.0   Microbiology No results found for this or any previous visit (from the past 240 hour(s)).   Medications:   . enoxaparin (LOVENOX) injection  40 mg Subcutaneous Daily  . escitalopram  5 mg Oral QHS  . [START ON 11/10/2018] metoprolol tartrate  12.5 mg Oral BID   Continuous Infusions: . sodium chloride 75 mL/hr at 11/09/18 0119  . cefTRIAXone (ROCEPHIN)  IV 1 g (11/09/18 0520)     LOS: 0 days   Charlynne Cousins  [facility]   *Please refer to [email or web address], password [email or web address] to get updated schedule on who will round on this patient, as hospitalists switch teams weekly. If 7PM-7AM, please contact night-coverage at [email or web address], password  [email or web address] for any overnight needs.  11/09/2018, 9:10 AM

## 2018-11-10 DIAGNOSIS — I499 Cardiac arrhythmia, unspecified: Secondary | ICD-10-CM | POA: Diagnosis not present

## 2018-11-10 DIAGNOSIS — R55 Syncope and collapse: Secondary | ICD-10-CM | POA: Diagnosis not present

## 2018-11-10 MED ORDER — CEPHALEXIN 500 MG PO CAPS: 500.0000 mg | ORAL_CAPSULE | Freq: Two times a day (BID) | ORAL | 0 refills | Status: AC

## 2018-11-10 NOTE — Progress Notes (Addendum)
Progress Note  Patient Name: Sheila Parrish Date of Encounter: 11/10/2018  Primary Cardiologist:  Fransico Him, MD  Subjective   Daughter is in room, was present when this happened.   Pt had eaten well, was up and walking around in the kitchen after dinner. No sx. Walking into another room, said felt dizzy and collapsed. Caught by family, no injuries. Was unresponsive but breathing for about a minute. SBP by EMS 80s at first.   No hx orthostatic sx, orthostatic VS were negative on admission.   Inpatient Medications    Scheduled Meds: . enoxaparin (LOVENOX) injection  40 mg Subcutaneous Daily  . escitalopram  5 mg Oral QHS  . metoprolol tartrate  12.5 mg Oral BID   Continuous Infusions: . cefTRIAXone (ROCEPHIN)  IV 1 g (11/09/18 0520)   PRN Meds: acetaminophen **OR** acetaminophen, haloperidol lactate, ondansetron **OR** ondansetron (ZOFRAN) IV, QUEtiapine   Vital Signs    Vitals:   11/09/18 1152 11/09/18 1700 11/09/18 2354 11/10/18 0602  BP: (!) 157/67 (!) 132/56 (!) 118/47 (!) 158/76  Pulse: 70 86 76 69  Resp: 17 16 16 16   Temp: 98 F (36.7 C) 99 F (37.2 C) 98.4 F (36.9 C) 98.4 F (36.9 C)  TempSrc: Oral Oral Oral Oral  SpO2: 98% 96% 93% 95%  Weight:      Height:        Intake/Output Summary (Last 24 hours) at 11/10/2018 9935 Last data filed at 11/09/2018 0955 Gross per 24 hour  Intake 403.97 ml  Output -  Net 403.97 ml   Filed Weights   11/09/18 0131  Weight: 61.2 kg    Telemetry    SR, PVCs, occasional brief bigeminy - Personally Reviewed  ECG    11/27 SR, HR 87, non-specific T wave flattening  - Personally Reviewed  Physical Exam   General: Well developed, well nourished, female appearing in no acute distress. Head: Normocephalic, atraumatic.  Neck: Supple without bruits, JVD not elevated. Lungs:  Resp regular and unlabored, CTA. Heart: RRR, S1, S2, no S3, S4, soft murmur; no rub. Abdomen: Soft, non-tender, non-distended with  normoactive bowel sounds. No hepatomegaly. No rebound/guarding. No obvious abdominal masses. Extremities: No clubbing, cyanosis, no edema. Distal pedal pulses are 2+ bilaterally. Neuro: Alert and oriented X 3. Moves all extremities spontaneously. Psych: Normal affect.  Labs    Hematology Recent Labs  Lab 11/08/18 2225 11/09/18 0050 11/09/18 0404  WBC 7.3 9.6 7.0  RBC 3.94 4.11 4.10  HGB 11.6* 12.1 12.1  HCT 37.5 37.8 37.9  MCV 95.2 92.0 92.4  MCH 29.4 29.4 29.5  MCHC 30.9 32.0 31.9  RDW 13.7 13.8 14.0  PLT 264 267 271    Chemistry Recent Labs  Lab 11/08/18 2225 11/09/18 0050 11/09/18 0557  NA 140  --  140  K 4.8  --  4.0  CL 109  --  112*  CO2 24  --  21*  GLUCOSE 113*  --  142*  BUN 15  --  10  CREATININE 0.88 0.96 0.88  CALCIUM 8.5*  --  8.6*  GFRNONAA 58* 52* 58*  GFRAA >60 >60 >60  ANIONGAP 7  --  7     Cardiac EnzymesNo results for input(s): TROPONINI in the last 168 hours. No results for input(s): TROPIPOC in the last 168 hours.   Radiology    Dg Orbits  Result Date: 11/09/2018 CLINICAL DATA:  Pre MRI clearance. EXAM: ORBITS - COMPLETE 4+ VIEW COMPARISON:  Head CT yesterday. FINDINGS:  There is no evidence of metallic foreign body within the orbits. No significant bone abnormality identified. IMPRESSION: No evidence of metallic foreign body within the orbits. Electronically Signed   By: Keith Rake M.D.   On: 11/09/2018 03:09   Dg Chest 1 View  Result Date: 11/09/2018 CLINICAL DATA:  Pre MRI clearance. EXAM: CHEST  1 VIEW COMPARISON:  Chest radiograph 07/08/2017 FINDINGS: No radiopaque foreign body in the thorax. Heart is normal in size. Mild aortic tortuosity. No acute airspace disease, pleural effusion or pneumothorax. IMPRESSION: 1. No metallic foreign body to preclude MRI imaging. 2. No acute chest finding. Electronically Signed   By: Keith Rake M.D.   On: 11/09/2018 03:10   Dg Abd 1 View  Result Date: 11/09/2018 CLINICAL DATA:  Pre MRI  clearance. EXAM: ABDOMEN - 1 VIEW COMPARISON:  None. FINDINGS: No radiopaque foreign bodies. No bowel dilatation or obstruction. Right colon interposed under the right hemidiaphragm. No evidence of free air. No radiopaque calculi. Scoliotic curvature in degenerative change in the spine. IMPRESSION: 1. No radiopaque foreign body. 2. No acute findings. Electronically Signed   By: Keith Rake M.D.   On: 11/09/2018 03:11   Ct Head Wo Contrast  Result Date: 11/08/2018 CLINICAL DATA:  Altered LOC EXAM: CT HEAD WITHOUT CONTRAST TECHNIQUE: Contiguous axial images were obtained from the base of the skull through the vertex without intravenous contrast. COMPARISON:  10/29/2016 head CT FINDINGS: Brain: No acute territorial infarction, hemorrhage, or intracranial mass is visualized. Moderate-to-marked atrophy. Moderate small vessel ischemic changes of the white matter. Stable ventricle size. Vascular: No hyperdense vessels.  Carotid vascular calcification Skull: Normal. Negative for fracture or focal lesion. Sinuses/Orbits: No acute finding. Other: None IMPRESSION: 1. No CT evidence for acute intracranial abnormality. 2. Atrophy and small vessel ischemic changes of the white matter Electronically Signed   By: Donavan Foil M.D.   On: 11/08/2018 23:15   Mr Brain Wo Contrast  Result Date: 11/09/2018 CLINICAL DATA:  Initial evaluation for acute syncope. EXAM: MRI HEAD WITHOUT CONTRAST TECHNIQUE: Multiplanar, multiecho pulse sequences of the brain and surrounding structures were obtained without intravenous contrast. COMPARISON:  Prior CT from 11/08/2018. FINDINGS: Brain: Moderately advanced cerebral atrophy with chronic small vessel ischemic disease. No acute intracranial hemorrhage. No abnormal foci of restricted diffusion to suggest acute or subacute ischemia. Gray-white matter differentiation maintained. No encephalomalacia to suggest chronic cortical infarction. No acute intracranial hemorrhage. Few chronic  micro hemorrhages noted within the right cerebral and cerebellar hemispheres. No mass lesion, midline shift or mass effect. No hydrocephalus. No extra-axial fluid collection. Pituitary gland normal. Vascular: Major intracranial vascular flow voids maintained. Skull and upper cervical spine: Craniocervical junction normal. Mild multilevel cervical spondylolysis noted within the upper cervical spine without significant stenosis. Bone marrow signal intensity within normal limits. No scalp soft tissue abnormality. Sinuses/Orbits: Globes and orbital soft tissues within normal limits. Patient status post bilateral ocular lens replacement. Paranasal sinuses are clear. No mastoid effusion. Inner ear structures normal. Other: None. IMPRESSION: 1. No acute intracranial abnormality. 2. Moderately advanced cerebral atrophy with chronic small vessel ischemic disease. Electronically Signed   By: Jeannine Boga M.D.   On: 11/09/2018 04:11     Cardiac Studies   ECHO:  11/09/2018 - Left ventricle: The cavity size was normal. Wall thickness was   increased in a pattern of mild LVH. Systolic function was normal.   The estimated ejection fraction was in the range of 60% to 65%.   Wall motion was normal; there  were no regional wall motion   abnormalities. - Aortic valve: Sclerosis without stenosis. There was mild   regurgitation. - Mitral valve: Mildly thickened leaflets . There was mild   regurgitation. - Tricuspid valve: There was mild regurgitation. - Pulmonary arteries: PA peak pressure: 40 mm Hg (S). - Inferior vena cava: The vessel was normal in size. The   respirophasic diameter changes were in the normal range (>= 50%),   consistent with normal central venous pressure.  Impressions:  - Compared to a prior study in 2017, the LVEF is higher at 60-65%.   There is persistent mild AI and mild MR with mild pulmonary   hypertension.  Patient Profile     84 y.o. female w/ hx syncope 2017 w/ neg  wkup, D-CHF, PVCs, HLD, scarlet fever, nl EF echo 2017 w/ grade 1 dd. Admitted 11/28 after syncope w/ prodrome, intially hypotensive. Neuro and cards seeing.  Assessment & Plan    1. Syncope - ?orthostatic hypotension but orthostatic VS were negative on admit - per dtr, sx worse this time than in 2017, negative event monitor at that time -Discussed repeat event monitor versus loop recorder with MD - EF nl on echo - Neuro eval for Sz  2.  PVCs and ventricular bigeminy: -Episodes of bigeminy are brief and not sufficient to cause symptoms. - She has had these episodes since admission, but has not had any feelings of being lightheaded or dizzy. -Continue to follow on telemetry while here  Otherwise, per IM Principal Problem:   Syncope and collapse Active Problems:   Ventricular bigeminy   Frequent PVCs   Signed, Rosaria Ferries , PA-C 8:08 AM 11/10/2018 Pager: [phone]  Personally seen and examined. Agree with above.  Her syncopal episodes were accompanied with a brief prodrome of not feeling well then a prolonged period of hypotension which responded to IV fluids.  EMS blood pressure originally in the 80s.  Had a similar episode in 2017.  She was empirically placed on metoprolol because of some ectopic beats.  Echocardiogram reviewed shows only mild aortic regurgitation/mitral regurgitation-murmur heard on exam.  She is alert, sitting in chair, feels comfortable, no edema.  Syncope - Conservative management.  Hydration, salt liberalization.  Stop metoprolol 12.5 mg twice a day at home which could exacerbate periods of hypotension.  Compression hose would be helpful.  Willing to tolerate blood pressures even in the 150s to 170s given her possibility for drastic decreases in blood pressure.  Autonomic dysfunction such as this is commonly seen especially as one advances in age.  Her daughter was in the room as well as her niece who is a ICU nurse.  I do not think that she needs a  event monitor-she already had 1 of these back in 2017-unremarkable, no loop recorder.  Family and patient is in agreement.  Urine culture no growth.  Mild mitral regurgitation/mild aortic regurgitation -Should be of no clinical consequence.  Murmur heard on exam.  Telemetry personally reviewed shows occasional PVCs, rare ventricular bigeminy.  Should be of no clinical consonants.  Stopping metoprolol 12.5 twice daily    [facility] will sign off.   Medication Recommendations: Stop metoprolol low-dose at home Other recommendations (labs, testing, etc): None Follow up as an outpatient: Follow-up in approximately 1 month with APP on Dr. Radford Pax team  Discussed with Dr. Florene Glen.

## 2018-11-10 NOTE — Evaluation (Signed)
Physical Therapy Evaluation and Discharge Patient Details Name: Sheila Parrish MRN: [id number] DOB: 18-Mar-1927 Today's Date: 11/10/2018   History of Present Illness  Pt is an 84 y/o female admitted secondary to syncope and collapse. Orthostatics negative on admission, however, pt did have PVCs and ventricular bigeminy. MRI negative for acute abnormality. PMH includes dementia and CVA.   Clinical Impression  Patient evaluated by Physical Therapy with no further acute PT needs identified. All education has been completed and the patient has no further questions. Pt from ILF and has assist with medicine management, bathing assist 2X/week, and all meals prepared for her. Pt at gross supervision level for all mobility. No LOB noted and pt able to perform horizontal head turns without LOB. Pt's family reports pt is close to baseline with mobility. See below for any follow-up Physical Therapy or equipment needs. PT is signing off. Thank you for this referral. If needs change, please re-consult.      Follow Up Recommendations No PT follow up    Equipment Recommendations  None recommended by PT    Recommendations for Other Services       Precautions / Restrictions Precautions Precautions: None Restrictions Weight Bearing Restrictions: No      Mobility  Bed Mobility               General bed mobility comments: In chair upon entry.   Transfers Overall transfer level: Needs assistance Equipment used: None Transfers: Sit to/from Stand Sit to Stand: Supervision         General transfer comment: Supervision for safety.   Ambulation/Gait Ambulation/Gait assistance: Supervision Gait Distance (Feet): 150 Feet Assistive device: None Gait Pattern/deviations: Step-through pattern;Decreased stride length Gait velocity: Decreased    General Gait Details: Slower gait, however, overall steady. Supervision for safety. No LOB noted and pt able to perform horizontal head turns without  LOB. Per family, pt close to baseline.   Stairs            Wheelchair Mobility    Modified Rankin (Stroke Patients Only)       Balance Overall balance assessment: Needs assistance Sitting-balance support: No upper extremity supported;Feet supported Sitting balance-Leahy Scale: Good     Standing balance support: No upper extremity supported;During functional activity Standing balance-Leahy Scale: Good                               Pertinent Vitals/Pain Pain Assessment: No/denies pain    Home Living Family/patient expects to be discharged to:: Private residence Living Arrangements: Alone Available Help at Discharge: Family;Personal care attendant;Available PRN/intermittently Type of Home: Independent living facility Home Access: Elevator     Home Layout: One level Home Equipment: Shower seat - built in;Grab bars - tub/shower      Prior Function Level of Independence: Needs assistance   Gait / Transfers Assistance Needed: Pt's daughter reports she is independent with ambulation.   ADL's / Homemaking Assistance Needed: Pt's daughter reports pt has assist with meals, medicine management, and assist with bathing tasks at ILF.         Hand Dominance        Extremity/Trunk Assessment   Upper Extremity Assessment Upper Extremity Assessment: Overall WFL for tasks assessed    Lower Extremity Assessment Lower Extremity Assessment: Overall WFL for tasks assessed(reports some arthritis in knees )    Cervical / Trunk Assessment Cervical / Trunk Assessment: Normal  Communication   Communication: No difficulties  Cognition Arousal/Alertness: Awake/alert Behavior During Therapy: WFL for tasks assessed/performed Overall Cognitive Status: History of cognitive impairments - at baseline                                 General Comments: Dementia at baseline       General Comments General comments (skin integrity, edema, etc.): Pt's  daughter and grandaughter present during session.     Exercises     Assessment/Plan    PT Assessment Patent does not need any further PT services  PT Problem List         PT Treatment Interventions      PT Goals (Current goals can be found in the Care Plan section)  Acute Rehab PT Goals Patient Stated Goal: to go back to ILF per pt's daughter  PT Goal Formulation: With patient/family Time For Goal Achievement: 11/10/18 Potential to Achieve Goals: Good    Frequency     Barriers to discharge        Co-evaluation               AM-PAC PT "6 Clicks" Mobility  Outcome Measure Help needed turning from your back to your side while in a flat bed without using bedrails?: None Help needed moving from lying on your back to sitting on the side of a flat bed without using bedrails?: None Help needed moving to and from a bed to a chair (including a wheelchair)?: None Help needed standing up from a chair using your arms (e.g., wheelchair or bedside chair)?: None Help needed to walk in hospital room?: None Help needed climbing 3-5 steps with a railing? : A Little 6 Click Score: 23    End of Session Equipment Utilized During Treatment: Gait belt Activity Tolerance: Patient tolerated treatment well Patient left: in chair;with call bell/phone within reach;with nursing/sitter in room;with family/visitor present Nurse Communication: Mobility status PT Visit Diagnosis: Other abnormalities of gait and mobility (R26.89)    Time: 1010-1027 PT Time Calculation (min) (ACUTE ONLY): 17 min   Charges:   PT Evaluation $PT Eval Low Complexity: Bell, PT, DPT  Acute Rehabilitation Services  Pager: [phone] Office: [phone]   Rudean Hitt 11/10/2018, 10:43 AM

## 2018-11-10 NOTE — Discharge Summary (Signed)
Physician Discharge Summary  Sheila Parrish OZH:[id number] DOB: February 13, 1927 DOA: 11/08/2018  PCP: Lavone Orn, MD  Admit date: 11/08/2018 Discharge date: 11/10/2018  Time spent: 40 minutes  Recommendations for Outpatient Follow-up:  1. Follow up outpatient CBC/CMP 2. Ensure follow up with cardiology as scheduled 3. Continue compression hose.  Metoprolol stopped.  Symptoms suspected 2/2 autonomic dysfunction.  Tolerate BP in 629'-528'U systolic per cards.  Discharge Diagnoses:  Principal Problem:   Syncope and collapse Active Problems:   Ventricular bigeminy   Frequent PVCs   Discharge Condition: stable  Diet recommendation: heart healthy  Filed Weights   11/09/18 0131  Weight: 61.2 kg    History of present illness:  Per PN from 11/28 Sheila Parrish is an 84 y.o. female past medical history of PVCs, dementia, chronic diastolic heart failure who was having dinner with family felt weak while walking, dizzy slumped over before her family is dropped to the floor.,  She lost control of sphincters, but the patient noticed that the family had Sheila Parrish right hand weakness and shaking spells.  She was admitted for an episode of syncope.  Workout was notable for negative orthostatics, MRI without acute findings (but with advanced cerebral atrophy with chronic small vessel ischemic disease).  An EEG was considered, but it was decided to defer this after discussion with pt and family by previous provider.  Echo was notable for normal EF, mild pulm HTN.  EKG with NSR.  Cardiology suspected this was 2/2 autonomic dysfunction.  Metoprolol was d/c'd, salt liberalization, hydration, compression hose recommended.  She had Sheila Parrish UA concerning for Sheila Parrish UTI and abx were continued at discharge.  Hospital Course:  Syncope and collapse Per cards, thought related to autonomic dysfunction Liberalize salt, hydrate, stop metoprolol, and try compression hose No recommendation for event monitor EEG was deferred based on  discussion with previous provider MRI without acute findings No event monitor recommended as she had one in 2017 which was unremarkable  Ventricular bigeminy/frequent PVCs D/c metoprolol as noted above  History of dementia: Stable  Depression: Continue Lexapro.  Possible UTI: Urine cx collected after pt was started on abx Continue keflex with UA concerning for UTI with + nitrite  Procedures: Echo Impressions:  - Compared to Sheila Parrish prior study in 2017, the LVEF is higher at 60-65%.   There is persistent mild AI and mild MR with mild pulmonary   hypertension.  Consultations:  Cardiology  Discharge Exam: Vitals:   11/09/18 2354 11/10/18 0602  BP: (!) 118/47 (!) 158/76  Pulse: 76 69  Resp: 16 16  Temp: 98.4 F (36.9 C) 98.4 F (36.9 C)  SpO2: 93% 95%   Feeling better. No complaints today. Can't tell me about events, but daughter relayed. Daughter and granddaughter at bedside.  General: No acute distress. Cardiovascular: Heart sounds show Sheila Parrish regular rate, and rhythm. .  Lungs: Clear to auscultation bilaterally Abdomen: Soft, nontender, nondistended Neurological: Alert and oriented 3. Moves all extremities 4 with equal strength. Cranial nerves II through XII intact. Skin: Warm and dry. No rashes or lesions. Extremities: No clubbing or cyanosis. No edema.  Psychiatric: Mood and affect are normal. Insight and judgment are appropriate.  Discharge Instructions   Discharge Instructions    Call MD for:  difficulty breathing, headache or visual disturbances   Complete by:  As directed    Call MD for:  extreme fatigue   Complete by:  As directed    Call MD for:  persistant dizziness or light-headedness  Complete by:  As directed    Call MD for:  persistant nausea and vomiting   Complete by:  As directed    Call MD for:  redness, tenderness, or signs of infection (pain, swelling, redness, odor or green/yellow discharge around incision site)   Complete by:  As  directed    Call MD for:  severe uncontrolled pain   Complete by:  As directed    Call MD for:  temperature >100.4   Complete by:  As directed    Diet - low sodium heart healthy   Complete by:  As directed    Discharge instructions   Complete by:  As directed    You were seen for an episode of syncope (fainting).  Your MRI did not show Sheila Parrish cause for your syncope.  Your echo also did not reveal Sheila Parrish cause.  We think this may be related to some autonomic dysfunction (your body not responding to changes in blood pressure as fast as it used to).  You can liberalize the salt in your diet.  Stop your metoprolol.  Use compression stockings.  Change positions slowly.    We will treat you for your likely urinary tract infection with keflex.  Take this twice daily for the next 6 days.  Return for new, recurrent, or worsening symptoms.  Please ask your PCP to request records from this hospitalization so they know what was done and what the next steps will be.   Increase activity slowly   Complete by:  As directed      Allergies as of 11/10/2018      Reactions   Donepezil Nausea And Vomiting   Alendronate Other (See Comments)   Difficulty swallowing   Atorvastatin Other (See Comments)   Leg cramping   Bactrim [sulfamethoxazole-trimethoprim] Nausea And Vomiting   Macrodantin [nitrofurantoin Macrocrystal] Other (See Comments)   Unknown reaction   Simvastatin Other (See Comments)   Leg cramping   Sulfa Antibiotics Nausea And Vomiting   Azithromycin Rash      Medication List    STOP taking these medications   metoprolol tartrate 25 MG tablet Commonly known as:  LOPRESSOR     TAKE these medications   cephALEXin 500 MG capsule Commonly known as:  KEFLEX Take 1 capsule (500 mg total) by mouth 2 (two) times daily for 6 days.   CITRACAL + D PO Take 1 tablet by mouth daily with breakfast.   donepezil 10 MG tablet Commonly known as:  ARICEPT Take 1/2 tablet daily for 2 weeks, then  increase to 1 tablet daily   escitalopram 5 MG tablet Commonly known as:  LEXAPRO TAKE 1 TABLET BY MOUTH EVERYDAY AT BEDTIME What changed:  See the new instructions.   multivitamin with minerals Tabs tablet Take 1 tablet by mouth daily with breakfast.      Allergies  Allergen Reactions  . Donepezil Nausea And Vomiting  . Alendronate Other (See Comments)    Difficulty swallowing  . Atorvastatin Other (See Comments)    Leg cramping  . Bactrim [Sulfamethoxazole-Trimethoprim] Nausea And Vomiting  . Macrodantin [Nitrofurantoin Macrocrystal] Other (See Comments)    Unknown reaction  . Simvastatin Other (See Comments)    Leg cramping  . Sulfa Antibiotics Nausea And Vomiting  . Azithromycin Rash   Follow-up Information    Lavone Orn, MD Follow up.   Specialty:  Internal Medicine Why:  Please call for Sheila Parrish follow up appointment within Sheila Parrish week Contact information: [address], [address]  [place] [address] [phone]        Sueanne Margarita, MD Follow up.   Specialty:  Cardiology Why:  Please call for Sheila Parrish follow up appointment Contact information: [address] [place] [address] [phone]            The results of significant diagnostics from this hospitalization (including imaging, microbiology, ancillary and laboratory) are listed below for reference.    Significant Diagnostic Studies: Dg Orbits  Result Date: 2018-11-16 CLINICAL DATA:  Pre MRI clearance. EXAM: ORBITS - COMPLETE 4+ VIEW COMPARISON:  Head CT yesterday. FINDINGS: There is no evidence of metallic foreign body within the orbits. No significant bone abnormality identified. IMPRESSION: No evidence of metallic foreign body within the orbits. Electronically Signed   By: Keith Rake M.D.   On: 16-Nov-2018 03:09   Dg Chest 1 View  Result Date: 2018/11/16 CLINICAL DATA:  Pre MRI clearance. EXAM: CHEST  1 VIEW COMPARISON:  Chest radiograph 07/08/2017 FINDINGS: No radiopaque  foreign body in the thorax. Heart is normal in size. Mild aortic tortuosity. No acute airspace disease, pleural effusion or pneumothorax. IMPRESSION: 1. No metallic foreign body to preclude MRI imaging. 2. No acute chest finding. Electronically Signed   By: Keith Rake M.D.   On: 2018-11-16 03:10   Dg Abd 1 View  Result Date: 2018/11/16 CLINICAL DATA:  Pre MRI clearance. EXAM: ABDOMEN - 1 VIEW COMPARISON:  None. FINDINGS: No radiopaque foreign bodies. No bowel dilatation or obstruction. Right colon interposed under the right hemidiaphragm. No evidence of free air. No radiopaque calculi. Scoliotic curvature in degenerative change in the spine. IMPRESSION: 1. No radiopaque foreign body. 2. No acute findings. Electronically Signed   By: Keith Rake M.D.   On: Nov 16, 2018 03:11   Ct Head Wo Contrast  Result Date: 11/08/2018 CLINICAL DATA:  Altered LOC EXAM: CT HEAD WITHOUT CONTRAST TECHNIQUE: Contiguous axial images were obtained from the base of the skull through the vertex without intravenous contrast. COMPARISON:  10/29/2016 head CT FINDINGS: Brain: No acute territorial infarction, hemorrhage, or intracranial mass is visualized. Moderate-to-marked atrophy. Moderate small vessel ischemic changes of the white matter. Stable ventricle size. Vascular: No hyperdense vessels.  Carotid vascular calcification Skull: Normal. Negative for fracture or focal lesion. Sinuses/Orbits: No acute finding. Other: None IMPRESSION: 1. No CT evidence for acute intracranial abnormality. 2. Atrophy and small vessel ischemic changes of the white matter Electronically Signed   By: Donavan Foil M.D.   On: 11/08/2018 23:15   Mr Brain Wo Contrast  Result Date: Nov 16, 2018 CLINICAL DATA:  Initial evaluation for acute syncope. EXAM: MRI HEAD WITHOUT CONTRAST TECHNIQUE: Multiplanar, multiecho pulse sequences of the brain and surrounding structures were obtained without intravenous contrast. COMPARISON:  Prior CT from  11/08/2018. FINDINGS: Brain: Moderately advanced cerebral atrophy with chronic small vessel ischemic disease. No acute intracranial hemorrhage. No abnormal foci of restricted diffusion to suggest acute or subacute ischemia. Gray-white matter differentiation maintained. No encephalomalacia to suggest chronic cortical infarction. No acute intracranial hemorrhage. Few chronic micro hemorrhages noted within the right cerebral and cerebellar hemispheres. No mass lesion, midline shift or mass effect. No hydrocephalus. No extra-axial fluid collection. Pituitary gland normal. Vascular: Major intracranial vascular flow voids maintained. Skull and upper cervical spine: Craniocervical junction normal. Mild multilevel cervical spondylolysis noted within the upper cervical spine without significant stenosis. Bone marrow signal intensity within normal limits. No scalp soft tissue abnormality. Sinuses/Orbits: Globes and orbital soft tissues within normal limits. Patient status post bilateral  ocular lens replacement. Paranasal sinuses are clear. No mastoid effusion. Inner ear structures normal. Other: None. IMPRESSION: 1. No acute intracranial abnormality. 2. Moderately advanced cerebral atrophy with chronic small vessel ischemic disease. Electronically Signed   By: Jeannine Boga M.D.   On: 11/09/2018 04:11    Microbiology: Recent Results (from the past 240 hour(s))  Culture, Urine     Status: None   Collection Time: 11/09/18  7:42 AM  Result Value Ref Range Status   Specimen Description URINE, RANDOM  Final   Special Requests NONE  Final   Culture   Final    NO GROWTH Performed at [facility], [address] [place], [place] [address]    Report Status 11/09/2018 FINAL  Final     Labs: Basic Metabolic Panel: Recent Labs  Lab 11/08/18 2225 11/09/18 0050 11/09/18 0557  NA 140  --  140  K 4.8  --  4.0  CL 109  --  112*  CO2 24  --  21*  GLUCOSE 113*  --  142*  BUN 15  --  10  CREATININE  0.88 0.96 0.88  CALCIUM 8.5*  --  8.6*   Liver Function Tests: No results for input(s): AST, ALT, ALKPHOS, BILITOT, PROT, ALBUMIN in the last 168 hours. No results for input(s): LIPASE, AMYLASE in the last 168 hours. No results for input(s): AMMONIA in the last 168 hours. CBC: Recent Labs  Lab 11/08/18 2225 11/09/18 0050 11/09/18 0404  WBC 7.3 9.6 7.0  HGB 11.6* 12.1 12.1  HCT 37.5 37.8 37.9  MCV 95.2 92.0 92.4  PLT 264 267 271   Cardiac Enzymes: No results for input(s): CKTOTAL, CKMB, CKMBINDEX, TROPONINI in the last 168 hours. BNP: BNP (last 3 results) No results for input(s): BNP in the last 8760 hours.  ProBNP (last 3 results) Recent Labs    04/26/18 1638  PROBNP 366    CBG: No results for input(s): GLUCAP in the last 168 hours.     Signed:  Fayrene Helper MD.  [facility] 11/10/2018, 10:40 AM

## 2018-11-21 DIAGNOSIS — M6281 Muscle weakness (generalized): Secondary | ICD-10-CM | POA: Diagnosis not present

## 2018-11-21 DIAGNOSIS — R488 Other symbolic dysfunctions: Secondary | ICD-10-CM | POA: Diagnosis not present

## 2018-11-21 DIAGNOSIS — R41841 Cognitive communication deficit: Secondary | ICD-10-CM | POA: Diagnosis not present

## 2018-11-22 DIAGNOSIS — R41841 Cognitive communication deficit: Secondary | ICD-10-CM | POA: Diagnosis not present

## 2018-11-22 DIAGNOSIS — R488 Other symbolic dysfunctions: Secondary | ICD-10-CM | POA: Diagnosis not present

## 2018-11-22 DIAGNOSIS — M6281 Muscle weakness (generalized): Secondary | ICD-10-CM | POA: Diagnosis not present

## 2018-11-23 DIAGNOSIS — R41841 Cognitive communication deficit: Secondary | ICD-10-CM | POA: Diagnosis not present

## 2018-11-23 DIAGNOSIS — R488 Other symbolic dysfunctions: Secondary | ICD-10-CM | POA: Diagnosis not present

## 2018-11-23 DIAGNOSIS — M6281 Muscle weakness (generalized): Secondary | ICD-10-CM | POA: Diagnosis not present

## 2018-11-24 DIAGNOSIS — R41841 Cognitive communication deficit: Secondary | ICD-10-CM | POA: Diagnosis not present

## 2018-11-24 DIAGNOSIS — R488 Other symbolic dysfunctions: Secondary | ICD-10-CM | POA: Diagnosis not present

## 2018-11-24 DIAGNOSIS — M6281 Muscle weakness (generalized): Secondary | ICD-10-CM | POA: Diagnosis not present

## 2018-11-27 DIAGNOSIS — R41841 Cognitive communication deficit: Secondary | ICD-10-CM | POA: Diagnosis not present

## 2018-11-27 DIAGNOSIS — M6281 Muscle weakness (generalized): Secondary | ICD-10-CM | POA: Diagnosis not present

## 2018-11-27 DIAGNOSIS — R488 Other symbolic dysfunctions: Secondary | ICD-10-CM | POA: Diagnosis not present

## 2018-11-28 DIAGNOSIS — M6281 Muscle weakness (generalized): Secondary | ICD-10-CM | POA: Diagnosis not present

## 2018-11-28 DIAGNOSIS — R41841 Cognitive communication deficit: Secondary | ICD-10-CM | POA: Diagnosis not present

## 2018-11-28 DIAGNOSIS — R488 Other symbolic dysfunctions: Secondary | ICD-10-CM | POA: Diagnosis not present

## 2018-11-29 DIAGNOSIS — M6281 Muscle weakness (generalized): Secondary | ICD-10-CM | POA: Diagnosis not present

## 2018-11-29 DIAGNOSIS — R41841 Cognitive communication deficit: Secondary | ICD-10-CM | POA: Diagnosis not present

## 2018-11-29 DIAGNOSIS — R488 Other symbolic dysfunctions: Secondary | ICD-10-CM | POA: Diagnosis not present

## 2018-11-30 DIAGNOSIS — R488 Other symbolic dysfunctions: Secondary | ICD-10-CM | POA: Diagnosis not present

## 2018-11-30 DIAGNOSIS — R41841 Cognitive communication deficit: Secondary | ICD-10-CM | POA: Diagnosis not present

## 2018-11-30 DIAGNOSIS — M6281 Muscle weakness (generalized): Secondary | ICD-10-CM | POA: Diagnosis not present

## 2018-12-01 DIAGNOSIS — R488 Other symbolic dysfunctions: Secondary | ICD-10-CM | POA: Diagnosis not present

## 2018-12-01 DIAGNOSIS — M6281 Muscle weakness (generalized): Secondary | ICD-10-CM | POA: Diagnosis not present

## 2018-12-01 DIAGNOSIS — R41841 Cognitive communication deficit: Secondary | ICD-10-CM | POA: Diagnosis not present

## 2018-12-04 DIAGNOSIS — R41841 Cognitive communication deficit: Secondary | ICD-10-CM | POA: Diagnosis not present

## 2018-12-04 DIAGNOSIS — R488 Other symbolic dysfunctions: Secondary | ICD-10-CM | POA: Diagnosis not present

## 2018-12-04 DIAGNOSIS — M6281 Muscle weakness (generalized): Secondary | ICD-10-CM | POA: Diagnosis not present

## 2018-12-07 DIAGNOSIS — M6281 Muscle weakness (generalized): Secondary | ICD-10-CM | POA: Diagnosis not present

## 2018-12-07 DIAGNOSIS — R41841 Cognitive communication deficit: Secondary | ICD-10-CM | POA: Diagnosis not present

## 2018-12-07 DIAGNOSIS — R488 Other symbolic dysfunctions: Secondary | ICD-10-CM | POA: Diagnosis not present

## 2018-12-08 DIAGNOSIS — M6281 Muscle weakness (generalized): Secondary | ICD-10-CM | POA: Diagnosis not present

## 2018-12-08 DIAGNOSIS — R41841 Cognitive communication deficit: Secondary | ICD-10-CM | POA: Diagnosis not present

## 2018-12-08 DIAGNOSIS — R488 Other symbolic dysfunctions: Secondary | ICD-10-CM | POA: Diagnosis not present

## 2018-12-11 DIAGNOSIS — R41841 Cognitive communication deficit: Secondary | ICD-10-CM | POA: Diagnosis not present

## 2018-12-11 DIAGNOSIS — R488 Other symbolic dysfunctions: Secondary | ICD-10-CM | POA: Diagnosis not present

## 2018-12-11 DIAGNOSIS — M6281 Muscle weakness (generalized): Secondary | ICD-10-CM | POA: Diagnosis not present

## 2018-12-13 DIAGNOSIS — M25562 Pain in left knee: Secondary | ICD-10-CM | POA: Diagnosis not present

## 2018-12-13 DIAGNOSIS — R488 Other symbolic dysfunctions: Secondary | ICD-10-CM | POA: Diagnosis not present

## 2018-12-13 DIAGNOSIS — M25561 Pain in right knee: Secondary | ICD-10-CM | POA: Diagnosis not present

## 2018-12-13 DIAGNOSIS — R41841 Cognitive communication deficit: Secondary | ICD-10-CM | POA: Diagnosis not present

## 2018-12-13 DIAGNOSIS — M6281 Muscle weakness (generalized): Secondary | ICD-10-CM | POA: Diagnosis not present

## 2018-12-13 DIAGNOSIS — R262 Difficulty in walking, not elsewhere classified: Secondary | ICD-10-CM | POA: Diagnosis not present

## 2018-12-14 DIAGNOSIS — R488 Other symbolic dysfunctions: Secondary | ICD-10-CM | POA: Diagnosis not present

## 2018-12-14 DIAGNOSIS — M6281 Muscle weakness (generalized): Secondary | ICD-10-CM | POA: Diagnosis not present

## 2018-12-14 DIAGNOSIS — M25561 Pain in right knee: Secondary | ICD-10-CM | POA: Diagnosis not present

## 2018-12-14 DIAGNOSIS — R262 Difficulty in walking, not elsewhere classified: Secondary | ICD-10-CM | POA: Diagnosis not present

## 2018-12-14 DIAGNOSIS — R41841 Cognitive communication deficit: Secondary | ICD-10-CM | POA: Diagnosis not present

## 2018-12-14 DIAGNOSIS — M25562 Pain in left knee: Secondary | ICD-10-CM | POA: Diagnosis not present

## 2018-12-18 DIAGNOSIS — M25562 Pain in left knee: Secondary | ICD-10-CM | POA: Diagnosis not present

## 2018-12-18 DIAGNOSIS — R262 Difficulty in walking, not elsewhere classified: Secondary | ICD-10-CM | POA: Diagnosis not present

## 2018-12-18 DIAGNOSIS — M25561 Pain in right knee: Secondary | ICD-10-CM | POA: Diagnosis not present

## 2018-12-18 DIAGNOSIS — R41841 Cognitive communication deficit: Secondary | ICD-10-CM | POA: Diagnosis not present

## 2018-12-18 DIAGNOSIS — M6281 Muscle weakness (generalized): Secondary | ICD-10-CM | POA: Diagnosis not present

## 2018-12-18 DIAGNOSIS — R488 Other symbolic dysfunctions: Secondary | ICD-10-CM | POA: Diagnosis not present

## 2018-12-19 ENCOUNTER — Encounter: Payer: Self-pay | Admitting: Physician Assistant

## 2018-12-19 ENCOUNTER — Ambulatory Visit (INDEPENDENT_AMBULATORY_CARE_PROVIDER_SITE_OTHER): Payer: Medicare PPO | Admitting: Physician Assistant

## 2018-12-19 VITALS — BP 148/84 | HR 80 | Ht 61.0 in | Wt 137.8 lb

## 2018-12-19 DIAGNOSIS — R55 Syncope and collapse: Secondary | ICD-10-CM

## 2018-12-19 DIAGNOSIS — I499 Cardiac arrhythmia, unspecified: Secondary | ICD-10-CM

## 2018-12-19 DIAGNOSIS — I498 Other specified cardiac arrhythmias: Secondary | ICD-10-CM

## 2018-12-19 NOTE — Progress Notes (Signed)
Cardiology Office Note    Date:  12/19/2018   ID:  Sheila Parrish, DOB 20-Feb-1927, MRN [id number]  PCP:  Sheila Orn, MD  Cardiologist: Sheila Him, MD EPS: None  Chief Complaint  Patient presents with  . Hospitalization Follow-up    History of Present Illness:  Sheila Parrish is a 85 y.o. female with history of syncope in 2017 with negative work-up, diastolic CHF, PVCs, HLD, normal LVEF in 2017 with grade 1 DD.    Patient was admitted to the hospital 11/09/2018 with syncope with prodrome of not feeling well dizziness which responded to IV fluids, initially hypotensive but not orthostatic on admission.  Patient had PVCs and ventricular bigeminy that was brief on telemetry.  Echo showed mild aortic regurgitation and mitral regurgitation normal LV function.  Conservative management was recommended with hydration and salt liberalization.  Metoprolol was stopped.  Willing to tolerate blood pressures of 035-597 systolic given her drastic decrease in blood pressure.  Event monitor was not recommended.  Patient comes in accompanied by her Sheila Parrish. Denies any further dizziness or syncope. Trying to stay hydrated. Weight up 6 lbs from august. Not wearing compression hose.    Past Medical History:  Diagnosis Date  . High cholesterol   . Scarlet fever 1936   Hospitalized for a month    Past Surgical History:  Procedure Laterality Date  . ABDOMINAL HYSTERECTOMY    . TOTAL KNEE ARTHROPLASTY Left 2010    Current Medications: Current Meds  Medication Sig  . Calcium Citrate-Vitamin D (CITRACAL + D PO) Take 1 tablet by mouth daily with breakfast.   . escitalopram (LEXAPRO) 5 MG tablet TAKE 1 TABLET BY MOUTH EVERYDAY AT BEDTIME (Patient taking differently: Take 5 mg by mouth daily with supper. )  . Multiple Vitamin (MULTIVITAMIN WITH MINERALS) TABS Take 1 tablet by mouth daily with breakfast.      Allergies:   Donepezil; Alendronate; Atorvastatin; Bactrim  [sulfamethoxazole-trimethoprim]; Macrodantin [nitrofurantoin macrocrystal]; Simvastatin; Sulfa antibiotics; and Azithromycin   Social History   Socioeconomic History  . Marital status: Married    Spouse name: Not on file  . Number of children: Not on file  . Years of education: Not on file  . Highest education level: Not on file  Occupational History  . Occupation: Retired  Scientific laboratory technician  . Financial resource strain: Not on file  . Food insecurity:    Worry: Not on file    Inability: Not on file  . Transportation needs:    Medical: Not on file    Non-medical: Not on file  Tobacco Use  . Smoking status: Never Smoker  . Smokeless tobacco: Never Used  Substance and Sexual Activity  . Alcohol use: No  . Drug use: No  . Sexual activity: Not on file  Lifestyle  . Physical activity:    Days per week: Not on file    Minutes per session: Not on file  . Stress: Not on file  Relationships  . Social connections:    Talks on phone: Not on file    Gets together: Not on file    Attends religious service: Not on file    Active member of club or organization: Not on file    Attends meetings of clubs or organizations: Not on file    Relationship status: Not on file  Other Topics Concern  . Not on file  Social History Narrative   Lives in 1 story apartment on the 2nd floor, with her  husband   Has 3 adult children   4 year degree   Retired [employer]     Family History:  The patient's  family history includes Arthritis in her mother; Stroke (age of onset: 86) in her father.   ROS:   Please see the history of present illness.    Review of Systems  Constitution: Negative.  HENT: Positive for hearing loss.   Eyes: Negative.   Cardiovascular: Negative.   Respiratory: Negative.   Hematologic/Lymphatic: Negative.   Musculoskeletal: Negative.  Negative for joint pain.  Gastrointestinal: Negative.   Genitourinary: Negative.   Neurological: Negative.    All other systems  reviewed and are negative.   PHYSICAL EXAM:   VS:  BP (!) 148/84   Pulse 80   Ht 5\' 1"  (1.549 m)   Wt 137 lb 12.8 oz (62.5 kg)   SpO2 93%   BMI 26.04 kg/m   Physical Exam  GEN: Well nourished, well developed, in no acute distress  Neck: no JVD, carotid bruits, or masses Cardiac:RRR; 2/6 systolic murmur at the left sternal border Respiratory:  clear to auscultation bilaterally, normal work of breathing GI: soft, nontender, nondistended, + BS Ext: without cyanosis, clubbing, or edema, Good distal pulses bilaterally Neuro:  Alert and Oriented x 3 Psych: euthymic mood, full affect  Wt Readings from Last 3 Encounters:  12/19/18 137 lb 12.8 oz (62.5 kg)  11/09/18 134 lb 14.7 oz (61.2 kg)  07/14/18 131 lb (59.4 kg)      Studies/Labs Reviewed:   EKG:  EKG is not ordered today.   Recent Labs: 01/06/2018: TSH 1.99 04/26/2018: NT-Pro BNP 366 11/09/2018: BUN 10; Creatinine, Ser 0.88; Hemoglobin 12.1; Platelets 271; Potassium 4.0; Sodium 140   Lipid Panel No results found for: CHOL, TRIG, HDL, CHOLHDL, VLDL, LDLCALC, LDLDIRECT  Additional studies/ records that were reviewed today include:  2D echo 11/28/2019Study Conclusions   - Left ventricle: The cavity size was normal. Wall thickness was   increased in a pattern of mild LVH. Systolic function was normal.   The estimated ejection fraction was in the range of 60% to 65%.   Wall motion was normal; there were no regional wall motion   abnormalities. - Aortic valve: Sclerosis without stenosis. There was mild   regurgitation. - Mitral valve: Mildly thickened leaflets . There was mild   regurgitation. - Tricuspid valve: There was mild regurgitation. - Pulmonary arteries: PA peak pressure: 40 mm Hg (S). - Inferior vena cava: The vessel was normal in size. The   respirophasic diameter changes were in the normal range (>= 50%),   consistent with normal central venous pressure.   Impressions:   - Compared to a prior study in 2017,  the LVEF is higher at 60-65%.   There is persistent mild AI and mild MR with mild pulmonary   hypertension.    Nuclear stress test 05/19/2017  The left ventricular ejection fraction is hyperdynamic (>65%).  Nuclear stress EF: 66%.  There was no ST segment deviation noted during stress.  The study is normal.   Normal study, no evidence for ischemia or infarction.  EF 66% with normal wall motion.        ASSESSMENT:    1. Syncope and collapse   2. Ventricular bigeminy      PLAN:  In order of problems listed above:  Syncope with collapse felt secondary to hypotension responded to IV fluids.  Had similar episode in 2017 with normal 30-day monitor.  2D echo  with normal LVEF.  Plan is for conservative management with liberalization of salt, hydration and tolerating blood pressures of 150-170.  No further dizziness or presyncope.  Recommend wearing compression hose and staying hydrated.  Ventricular bigeminy on telemetry in the hospital- brief, metoprolol stopped and doing well.  No changes.  Follow-up with Dr. Radford Pax in 3 to 4 months.   Medication Adjustments/Labs and Tests Ordered: Current medicines are reviewed at length with the patient today.  Concerns regarding medicines are outlined above.  Medication changes, Labs and Tests ordered today are listed in the Patient Instructions below. Patient Instructions  Medication Instructions:  Your physician recommends that you continue on your current medications as directed. Please refer to the Current Medication list given to you today.  If you need a refill on your cardiac medications before your next appointment, please call your pharmacy.   Lab work: None Ordered  If you have labs (blood work) drawn today and your tests are completely normal, you will receive your results only by: [name] Kitchen MyChart Message (if you have MyChart) OR . A paper copy in the mail If you have any lab test that is abnormal or we need to change your treatment,  we will call you to review the results.  Testing/Procedures: None ordered  Follow-Up: At [facility], you and your health needs are our priority.  As part of our continuing mission to provide you with exceptional heart care, we have created designated Provider Care Teams.  These Care Teams include your primary Cardiologist (physician) and Advanced Practice Providers (APPs -  Physician Assistants and Nurse Practitioners) who all work together to provide you with the care you need, when you need it. . You will need a follow up appointment in 3-4 months.  Please call our office 2 months in advance to schedule this appointment.  You may see Sheila Him, MD or one of the following Advanced Practice Providers on your designated Care Team:   . Lyda Jester, PA-C . Dayna Dunn, PA-C . Ermalinda Barrios, PA-C  Any Other Special Instructions Will Be Listed Below (If Applicable).  Wear compression stockings  Stay Hydrated      Sumner Boast, PA-C  12/19/2018 [facility] [address], [place], [place]  [address] Phone: [phone]; Fax: [phone]

## 2018-12-19 NOTE — Patient Instructions (Addendum)
Medication Instructions:  Your physician recommends that you continue on your current medications as directed. Please refer to the Current Medication list given to you today.  If you need a refill on your cardiac medications before your next appointment, please call your pharmacy.   Lab work: None Ordered  If you have labs (blood work) drawn today and your tests are completely normal, you will receive your results only by: [name] Kitchen MyChart Message (if you have MyChart) OR . A paper copy in the mail If you have any lab test that is abnormal or we need to change your treatment, we will call you to review the results.  Testing/Procedures: None ordered  Follow-Up: At [facility], you and your health needs are our priority.  As part of our continuing mission to provide you with exceptional heart care, we have created designated Provider Care Teams.  These Care Teams include your primary Cardiologist (physician) and Advanced Practice Providers (APPs -  Physician Assistants and Nurse Practitioners) who all work together to provide you with the care you need, when you need it. . You will need a follow up appointment in 3-4 months.  Please call our office 2 months in advance to schedule this appointment.  You may see Fransico Him, MD or one of the following Advanced Practice Providers on your designated Care Team:   . Lyda Jester, PA-C . Dayna Dunn, PA-C . Ermalinda Barrios, PA-C  Any Other Special Instructions Will Be Listed Below (If Applicable).  Wear compression stockings  Stay Hydrated

## 2018-12-21 DIAGNOSIS — M6281 Muscle weakness (generalized): Secondary | ICD-10-CM | POA: Diagnosis not present

## 2018-12-21 DIAGNOSIS — R488 Other symbolic dysfunctions: Secondary | ICD-10-CM | POA: Diagnosis not present

## 2018-12-21 DIAGNOSIS — M25562 Pain in left knee: Secondary | ICD-10-CM | POA: Diagnosis not present

## 2018-12-21 DIAGNOSIS — R41841 Cognitive communication deficit: Secondary | ICD-10-CM | POA: Diagnosis not present

## 2018-12-21 DIAGNOSIS — R262 Difficulty in walking, not elsewhere classified: Secondary | ICD-10-CM | POA: Diagnosis not present

## 2018-12-21 DIAGNOSIS — M25561 Pain in right knee: Secondary | ICD-10-CM | POA: Diagnosis not present

## 2018-12-22 DIAGNOSIS — R262 Difficulty in walking, not elsewhere classified: Secondary | ICD-10-CM | POA: Diagnosis not present

## 2018-12-22 DIAGNOSIS — R488 Other symbolic dysfunctions: Secondary | ICD-10-CM | POA: Diagnosis not present

## 2018-12-22 DIAGNOSIS — M6281 Muscle weakness (generalized): Secondary | ICD-10-CM | POA: Diagnosis not present

## 2018-12-22 DIAGNOSIS — M25561 Pain in right knee: Secondary | ICD-10-CM | POA: Diagnosis not present

## 2018-12-22 DIAGNOSIS — M25562 Pain in left knee: Secondary | ICD-10-CM | POA: Diagnosis not present

## 2018-12-22 DIAGNOSIS — R41841 Cognitive communication deficit: Secondary | ICD-10-CM | POA: Diagnosis not present

## 2018-12-25 DIAGNOSIS — R488 Other symbolic dysfunctions: Secondary | ICD-10-CM | POA: Diagnosis not present

## 2018-12-25 DIAGNOSIS — R41841 Cognitive communication deficit: Secondary | ICD-10-CM | POA: Diagnosis not present

## 2018-12-25 DIAGNOSIS — R262 Difficulty in walking, not elsewhere classified: Secondary | ICD-10-CM | POA: Diagnosis not present

## 2018-12-25 DIAGNOSIS — M6281 Muscle weakness (generalized): Secondary | ICD-10-CM | POA: Diagnosis not present

## 2018-12-25 DIAGNOSIS — M25562 Pain in left knee: Secondary | ICD-10-CM | POA: Diagnosis not present

## 2018-12-25 DIAGNOSIS — M25561 Pain in right knee: Secondary | ICD-10-CM | POA: Diagnosis not present

## 2018-12-26 DIAGNOSIS — R41841 Cognitive communication deficit: Secondary | ICD-10-CM | POA: Diagnosis not present

## 2018-12-26 DIAGNOSIS — M25561 Pain in right knee: Secondary | ICD-10-CM | POA: Diagnosis not present

## 2018-12-26 DIAGNOSIS — R262 Difficulty in walking, not elsewhere classified: Secondary | ICD-10-CM | POA: Diagnosis not present

## 2018-12-26 DIAGNOSIS — M6281 Muscle weakness (generalized): Secondary | ICD-10-CM | POA: Diagnosis not present

## 2018-12-26 DIAGNOSIS — R488 Other symbolic dysfunctions: Secondary | ICD-10-CM | POA: Diagnosis not present

## 2018-12-26 DIAGNOSIS — M25562 Pain in left knee: Secondary | ICD-10-CM | POA: Diagnosis not present

## 2018-12-27 DIAGNOSIS — M25561 Pain in right knee: Secondary | ICD-10-CM | POA: Diagnosis not present

## 2018-12-27 DIAGNOSIS — M25562 Pain in left knee: Secondary | ICD-10-CM | POA: Diagnosis not present

## 2018-12-27 DIAGNOSIS — R488 Other symbolic dysfunctions: Secondary | ICD-10-CM | POA: Diagnosis not present

## 2018-12-27 DIAGNOSIS — M6281 Muscle weakness (generalized): Secondary | ICD-10-CM | POA: Diagnosis not present

## 2018-12-27 DIAGNOSIS — R41841 Cognitive communication deficit: Secondary | ICD-10-CM | POA: Diagnosis not present

## 2018-12-27 DIAGNOSIS — R262 Difficulty in walking, not elsewhere classified: Secondary | ICD-10-CM | POA: Diagnosis not present

## 2018-12-28 DIAGNOSIS — R262 Difficulty in walking, not elsewhere classified: Secondary | ICD-10-CM | POA: Diagnosis not present

## 2018-12-28 DIAGNOSIS — M25561 Pain in right knee: Secondary | ICD-10-CM | POA: Diagnosis not present

## 2018-12-28 DIAGNOSIS — M25562 Pain in left knee: Secondary | ICD-10-CM | POA: Diagnosis not present

## 2018-12-28 DIAGNOSIS — R41841 Cognitive communication deficit: Secondary | ICD-10-CM | POA: Diagnosis not present

## 2018-12-28 DIAGNOSIS — R488 Other symbolic dysfunctions: Secondary | ICD-10-CM | POA: Diagnosis not present

## 2018-12-28 DIAGNOSIS — M6281 Muscle weakness (generalized): Secondary | ICD-10-CM | POA: Diagnosis not present

## 2018-12-29 DIAGNOSIS — R488 Other symbolic dysfunctions: Secondary | ICD-10-CM | POA: Diagnosis not present

## 2018-12-29 DIAGNOSIS — R262 Difficulty in walking, not elsewhere classified: Secondary | ICD-10-CM | POA: Diagnosis not present

## 2018-12-29 DIAGNOSIS — M25561 Pain in right knee: Secondary | ICD-10-CM | POA: Diagnosis not present

## 2018-12-29 DIAGNOSIS — R41841 Cognitive communication deficit: Secondary | ICD-10-CM | POA: Diagnosis not present

## 2018-12-29 DIAGNOSIS — M6281 Muscle weakness (generalized): Secondary | ICD-10-CM | POA: Diagnosis not present

## 2018-12-29 DIAGNOSIS — M25562 Pain in left knee: Secondary | ICD-10-CM | POA: Diagnosis not present

## 2019-01-01 DIAGNOSIS — Z87898 Personal history of other specified conditions: Secondary | ICD-10-CM | POA: Diagnosis not present

## 2019-01-01 DIAGNOSIS — M6281 Muscle weakness (generalized): Secondary | ICD-10-CM | POA: Diagnosis not present

## 2019-01-01 DIAGNOSIS — L989 Disorder of the skin and subcutaneous tissue, unspecified: Secondary | ICD-10-CM | POA: Diagnosis not present

## 2019-01-01 DIAGNOSIS — R488 Other symbolic dysfunctions: Secondary | ICD-10-CM | POA: Diagnosis not present

## 2019-01-01 DIAGNOSIS — M25562 Pain in left knee: Secondary | ICD-10-CM | POA: Diagnosis not present

## 2019-01-01 DIAGNOSIS — M25569 Pain in unspecified knee: Secondary | ICD-10-CM | POA: Diagnosis not present

## 2019-01-01 DIAGNOSIS — M810 Age-related osteoporosis without current pathological fracture: Secondary | ICD-10-CM | POA: Diagnosis not present

## 2019-01-01 DIAGNOSIS — R262 Difficulty in walking, not elsewhere classified: Secondary | ICD-10-CM | POA: Diagnosis not present

## 2019-01-01 DIAGNOSIS — R41841 Cognitive communication deficit: Secondary | ICD-10-CM | POA: Diagnosis not present

## 2019-01-01 DIAGNOSIS — M25561 Pain in right knee: Secondary | ICD-10-CM | POA: Diagnosis not present

## 2019-01-02 DIAGNOSIS — R488 Other symbolic dysfunctions: Secondary | ICD-10-CM | POA: Diagnosis not present

## 2019-01-02 DIAGNOSIS — M25562 Pain in left knee: Secondary | ICD-10-CM | POA: Diagnosis not present

## 2019-01-02 DIAGNOSIS — M6281 Muscle weakness (generalized): Secondary | ICD-10-CM | POA: Diagnosis not present

## 2019-01-02 DIAGNOSIS — R41841 Cognitive communication deficit: Secondary | ICD-10-CM | POA: Diagnosis not present

## 2019-01-02 DIAGNOSIS — M25561 Pain in right knee: Secondary | ICD-10-CM | POA: Diagnosis not present

## 2019-01-02 DIAGNOSIS — R262 Difficulty in walking, not elsewhere classified: Secondary | ICD-10-CM | POA: Diagnosis not present

## 2019-01-03 DIAGNOSIS — R488 Other symbolic dysfunctions: Secondary | ICD-10-CM | POA: Diagnosis not present

## 2019-01-03 DIAGNOSIS — R41841 Cognitive communication deficit: Secondary | ICD-10-CM | POA: Diagnosis not present

## 2019-01-03 DIAGNOSIS — R262 Difficulty in walking, not elsewhere classified: Secondary | ICD-10-CM | POA: Diagnosis not present

## 2019-01-03 DIAGNOSIS — M25561 Pain in right knee: Secondary | ICD-10-CM | POA: Diagnosis not present

## 2019-01-03 DIAGNOSIS — M25562 Pain in left knee: Secondary | ICD-10-CM | POA: Diagnosis not present

## 2019-01-03 DIAGNOSIS — M6281 Muscle weakness (generalized): Secondary | ICD-10-CM | POA: Diagnosis not present

## 2019-01-04 DIAGNOSIS — M25562 Pain in left knee: Secondary | ICD-10-CM | POA: Diagnosis not present

## 2019-01-04 DIAGNOSIS — M25561 Pain in right knee: Secondary | ICD-10-CM | POA: Diagnosis not present

## 2019-01-04 DIAGNOSIS — R262 Difficulty in walking, not elsewhere classified: Secondary | ICD-10-CM | POA: Diagnosis not present

## 2019-01-04 DIAGNOSIS — R41841 Cognitive communication deficit: Secondary | ICD-10-CM | POA: Diagnosis not present

## 2019-01-04 DIAGNOSIS — R488 Other symbolic dysfunctions: Secondary | ICD-10-CM | POA: Diagnosis not present

## 2019-01-04 DIAGNOSIS — M6281 Muscle weakness (generalized): Secondary | ICD-10-CM | POA: Diagnosis not present

## 2019-01-05 DIAGNOSIS — M25562 Pain in left knee: Secondary | ICD-10-CM | POA: Diagnosis not present

## 2019-01-05 DIAGNOSIS — M6281 Muscle weakness (generalized): Secondary | ICD-10-CM | POA: Diagnosis not present

## 2019-01-05 DIAGNOSIS — R488 Other symbolic dysfunctions: Secondary | ICD-10-CM | POA: Diagnosis not present

## 2019-01-05 DIAGNOSIS — R41841 Cognitive communication deficit: Secondary | ICD-10-CM | POA: Diagnosis not present

## 2019-01-05 DIAGNOSIS — M25561 Pain in right knee: Secondary | ICD-10-CM | POA: Diagnosis not present

## 2019-01-05 DIAGNOSIS — R262 Difficulty in walking, not elsewhere classified: Secondary | ICD-10-CM | POA: Diagnosis not present

## 2019-01-08 DIAGNOSIS — R488 Other symbolic dysfunctions: Secondary | ICD-10-CM | POA: Diagnosis not present

## 2019-01-08 DIAGNOSIS — M25561 Pain in right knee: Secondary | ICD-10-CM | POA: Diagnosis not present

## 2019-01-08 DIAGNOSIS — R41841 Cognitive communication deficit: Secondary | ICD-10-CM | POA: Diagnosis not present

## 2019-01-08 DIAGNOSIS — M25562 Pain in left knee: Secondary | ICD-10-CM | POA: Diagnosis not present

## 2019-01-08 DIAGNOSIS — M6281 Muscle weakness (generalized): Secondary | ICD-10-CM | POA: Diagnosis not present

## 2019-01-08 DIAGNOSIS — R262 Difficulty in walking, not elsewhere classified: Secondary | ICD-10-CM | POA: Diagnosis not present

## 2019-01-09 DIAGNOSIS — R41841 Cognitive communication deficit: Secondary | ICD-10-CM | POA: Diagnosis not present

## 2019-01-09 DIAGNOSIS — M25561 Pain in right knee: Secondary | ICD-10-CM | POA: Diagnosis not present

## 2019-01-09 DIAGNOSIS — R488 Other symbolic dysfunctions: Secondary | ICD-10-CM | POA: Diagnosis not present

## 2019-01-09 DIAGNOSIS — M6281 Muscle weakness (generalized): Secondary | ICD-10-CM | POA: Diagnosis not present

## 2019-01-09 DIAGNOSIS — M25562 Pain in left knee: Secondary | ICD-10-CM | POA: Diagnosis not present

## 2019-01-09 DIAGNOSIS — R262 Difficulty in walking, not elsewhere classified: Secondary | ICD-10-CM | POA: Diagnosis not present

## 2019-01-10 DIAGNOSIS — M25562 Pain in left knee: Secondary | ICD-10-CM | POA: Diagnosis not present

## 2019-01-10 DIAGNOSIS — R262 Difficulty in walking, not elsewhere classified: Secondary | ICD-10-CM | POA: Diagnosis not present

## 2019-01-10 DIAGNOSIS — M25561 Pain in right knee: Secondary | ICD-10-CM | POA: Diagnosis not present

## 2019-01-10 DIAGNOSIS — R488 Other symbolic dysfunctions: Secondary | ICD-10-CM | POA: Diagnosis not present

## 2019-01-10 DIAGNOSIS — M6281 Muscle weakness (generalized): Secondary | ICD-10-CM | POA: Diagnosis not present

## 2019-01-10 DIAGNOSIS — R41841 Cognitive communication deficit: Secondary | ICD-10-CM | POA: Diagnosis not present

## 2019-01-12 DIAGNOSIS — M6281 Muscle weakness (generalized): Secondary | ICD-10-CM | POA: Diagnosis not present

## 2019-01-12 DIAGNOSIS — M25561 Pain in right knee: Secondary | ICD-10-CM | POA: Diagnosis not present

## 2019-01-12 DIAGNOSIS — R488 Other symbolic dysfunctions: Secondary | ICD-10-CM | POA: Diagnosis not present

## 2019-01-12 DIAGNOSIS — M25562 Pain in left knee: Secondary | ICD-10-CM | POA: Diagnosis not present

## 2019-01-12 DIAGNOSIS — R41841 Cognitive communication deficit: Secondary | ICD-10-CM | POA: Diagnosis not present

## 2019-01-12 DIAGNOSIS — R262 Difficulty in walking, not elsewhere classified: Secondary | ICD-10-CM | POA: Diagnosis not present

## 2019-01-15 DIAGNOSIS — M25561 Pain in right knee: Secondary | ICD-10-CM | POA: Diagnosis not present

## 2019-01-15 DIAGNOSIS — R41841 Cognitive communication deficit: Secondary | ICD-10-CM | POA: Diagnosis not present

## 2019-01-15 DIAGNOSIS — R488 Other symbolic dysfunctions: Secondary | ICD-10-CM | POA: Diagnosis not present

## 2019-01-15 DIAGNOSIS — M25562 Pain in left knee: Secondary | ICD-10-CM | POA: Diagnosis not present

## 2019-01-15 DIAGNOSIS — M6281 Muscle weakness (generalized): Secondary | ICD-10-CM | POA: Diagnosis not present

## 2019-01-15 DIAGNOSIS — R262 Difficulty in walking, not elsewhere classified: Secondary | ICD-10-CM | POA: Diagnosis not present

## 2019-01-16 DIAGNOSIS — M6281 Muscle weakness (generalized): Secondary | ICD-10-CM | POA: Diagnosis not present

## 2019-01-16 DIAGNOSIS — M25561 Pain in right knee: Secondary | ICD-10-CM | POA: Diagnosis not present

## 2019-01-16 DIAGNOSIS — M25562 Pain in left knee: Secondary | ICD-10-CM | POA: Diagnosis not present

## 2019-01-16 DIAGNOSIS — R488 Other symbolic dysfunctions: Secondary | ICD-10-CM | POA: Diagnosis not present

## 2019-01-16 DIAGNOSIS — R262 Difficulty in walking, not elsewhere classified: Secondary | ICD-10-CM | POA: Diagnosis not present

## 2019-01-16 DIAGNOSIS — R41841 Cognitive communication deficit: Secondary | ICD-10-CM | POA: Diagnosis not present

## 2019-01-17 DIAGNOSIS — M25561 Pain in right knee: Secondary | ICD-10-CM | POA: Diagnosis not present

## 2019-01-17 DIAGNOSIS — R41841 Cognitive communication deficit: Secondary | ICD-10-CM | POA: Diagnosis not present

## 2019-01-17 DIAGNOSIS — R262 Difficulty in walking, not elsewhere classified: Secondary | ICD-10-CM | POA: Diagnosis not present

## 2019-01-17 DIAGNOSIS — R488 Other symbolic dysfunctions: Secondary | ICD-10-CM | POA: Diagnosis not present

## 2019-01-17 DIAGNOSIS — M25562 Pain in left knee: Secondary | ICD-10-CM | POA: Diagnosis not present

## 2019-01-17 DIAGNOSIS — M6281 Muscle weakness (generalized): Secondary | ICD-10-CM | POA: Diagnosis not present

## 2019-01-18 DIAGNOSIS — R488 Other symbolic dysfunctions: Secondary | ICD-10-CM | POA: Diagnosis not present

## 2019-01-18 DIAGNOSIS — M25561 Pain in right knee: Secondary | ICD-10-CM | POA: Diagnosis not present

## 2019-01-18 DIAGNOSIS — R262 Difficulty in walking, not elsewhere classified: Secondary | ICD-10-CM | POA: Diagnosis not present

## 2019-01-18 DIAGNOSIS — M25562 Pain in left knee: Secondary | ICD-10-CM | POA: Diagnosis not present

## 2019-01-18 DIAGNOSIS — M6281 Muscle weakness (generalized): Secondary | ICD-10-CM | POA: Diagnosis not present

## 2019-01-18 DIAGNOSIS — R41841 Cognitive communication deficit: Secondary | ICD-10-CM | POA: Diagnosis not present

## 2019-01-19 DIAGNOSIS — M6281 Muscle weakness (generalized): Secondary | ICD-10-CM | POA: Diagnosis not present

## 2019-01-19 DIAGNOSIS — M25561 Pain in right knee: Secondary | ICD-10-CM | POA: Diagnosis not present

## 2019-01-19 DIAGNOSIS — R488 Other symbolic dysfunctions: Secondary | ICD-10-CM | POA: Diagnosis not present

## 2019-01-19 DIAGNOSIS — R41841 Cognitive communication deficit: Secondary | ICD-10-CM | POA: Diagnosis not present

## 2019-01-19 DIAGNOSIS — M25562 Pain in left knee: Secondary | ICD-10-CM | POA: Diagnosis not present

## 2019-01-19 DIAGNOSIS — R262 Difficulty in walking, not elsewhere classified: Secondary | ICD-10-CM | POA: Diagnosis not present

## 2019-01-22 DIAGNOSIS — R41841 Cognitive communication deficit: Secondary | ICD-10-CM | POA: Diagnosis not present

## 2019-01-22 DIAGNOSIS — M25561 Pain in right knee: Secondary | ICD-10-CM | POA: Diagnosis not present

## 2019-01-22 DIAGNOSIS — R262 Difficulty in walking, not elsewhere classified: Secondary | ICD-10-CM | POA: Diagnosis not present

## 2019-01-22 DIAGNOSIS — M25562 Pain in left knee: Secondary | ICD-10-CM | POA: Diagnosis not present

## 2019-01-22 DIAGNOSIS — M6281 Muscle weakness (generalized): Secondary | ICD-10-CM | POA: Diagnosis not present

## 2019-01-22 DIAGNOSIS — R488 Other symbolic dysfunctions: Secondary | ICD-10-CM | POA: Diagnosis not present

## 2019-01-23 DIAGNOSIS — R41841 Cognitive communication deficit: Secondary | ICD-10-CM | POA: Diagnosis not present

## 2019-01-23 DIAGNOSIS — R488 Other symbolic dysfunctions: Secondary | ICD-10-CM | POA: Diagnosis not present

## 2019-01-23 DIAGNOSIS — R262 Difficulty in walking, not elsewhere classified: Secondary | ICD-10-CM | POA: Diagnosis not present

## 2019-01-23 DIAGNOSIS — M6281 Muscle weakness (generalized): Secondary | ICD-10-CM | POA: Diagnosis not present

## 2019-01-23 DIAGNOSIS — M25561 Pain in right knee: Secondary | ICD-10-CM | POA: Diagnosis not present

## 2019-01-23 DIAGNOSIS — M25562 Pain in left knee: Secondary | ICD-10-CM | POA: Diagnosis not present

## 2019-01-25 DIAGNOSIS — R262 Difficulty in walking, not elsewhere classified: Secondary | ICD-10-CM | POA: Diagnosis not present

## 2019-01-25 DIAGNOSIS — R41841 Cognitive communication deficit: Secondary | ICD-10-CM | POA: Diagnosis not present

## 2019-01-25 DIAGNOSIS — R488 Other symbolic dysfunctions: Secondary | ICD-10-CM | POA: Diagnosis not present

## 2019-01-25 DIAGNOSIS — M6281 Muscle weakness (generalized): Secondary | ICD-10-CM | POA: Diagnosis not present

## 2019-01-25 DIAGNOSIS — M25562 Pain in left knee: Secondary | ICD-10-CM | POA: Diagnosis not present

## 2019-01-25 DIAGNOSIS — M25561 Pain in right knee: Secondary | ICD-10-CM | POA: Diagnosis not present

## 2019-01-26 DIAGNOSIS — M6281 Muscle weakness (generalized): Secondary | ICD-10-CM | POA: Diagnosis not present

## 2019-01-26 DIAGNOSIS — M25562 Pain in left knee: Secondary | ICD-10-CM | POA: Diagnosis not present

## 2019-01-26 DIAGNOSIS — R41841 Cognitive communication deficit: Secondary | ICD-10-CM | POA: Diagnosis not present

## 2019-01-26 DIAGNOSIS — R262 Difficulty in walking, not elsewhere classified: Secondary | ICD-10-CM | POA: Diagnosis not present

## 2019-01-26 DIAGNOSIS — R488 Other symbolic dysfunctions: Secondary | ICD-10-CM | POA: Diagnosis not present

## 2019-01-26 DIAGNOSIS — M25561 Pain in right knee: Secondary | ICD-10-CM | POA: Diagnosis not present

## 2019-01-29 DIAGNOSIS — R488 Other symbolic dysfunctions: Secondary | ICD-10-CM | POA: Diagnosis not present

## 2019-01-29 DIAGNOSIS — M25561 Pain in right knee: Secondary | ICD-10-CM | POA: Diagnosis not present

## 2019-01-29 DIAGNOSIS — M25562 Pain in left knee: Secondary | ICD-10-CM | POA: Diagnosis not present

## 2019-01-29 DIAGNOSIS — R262 Difficulty in walking, not elsewhere classified: Secondary | ICD-10-CM | POA: Diagnosis not present

## 2019-01-29 DIAGNOSIS — R41841 Cognitive communication deficit: Secondary | ICD-10-CM | POA: Diagnosis not present

## 2019-01-29 DIAGNOSIS — M6281 Muscle weakness (generalized): Secondary | ICD-10-CM | POA: Diagnosis not present

## 2019-01-30 DIAGNOSIS — R262 Difficulty in walking, not elsewhere classified: Secondary | ICD-10-CM | POA: Diagnosis not present

## 2019-01-30 DIAGNOSIS — M6281 Muscle weakness (generalized): Secondary | ICD-10-CM | POA: Diagnosis not present

## 2019-01-30 DIAGNOSIS — M25562 Pain in left knee: Secondary | ICD-10-CM | POA: Diagnosis not present

## 2019-01-30 DIAGNOSIS — R488 Other symbolic dysfunctions: Secondary | ICD-10-CM | POA: Diagnosis not present

## 2019-01-30 DIAGNOSIS — R41841 Cognitive communication deficit: Secondary | ICD-10-CM | POA: Diagnosis not present

## 2019-01-30 DIAGNOSIS — M25561 Pain in right knee: Secondary | ICD-10-CM | POA: Diagnosis not present

## 2019-01-31 DIAGNOSIS — M25562 Pain in left knee: Secondary | ICD-10-CM | POA: Diagnosis not present

## 2019-01-31 DIAGNOSIS — Z85828 Personal history of other malignant neoplasm of skin: Secondary | ICD-10-CM | POA: Diagnosis not present

## 2019-01-31 DIAGNOSIS — M25561 Pain in right knee: Secondary | ICD-10-CM | POA: Diagnosis not present

## 2019-01-31 DIAGNOSIS — D485 Neoplasm of uncertain behavior of skin: Secondary | ICD-10-CM | POA: Diagnosis not present

## 2019-01-31 DIAGNOSIS — R488 Other symbolic dysfunctions: Secondary | ICD-10-CM | POA: Diagnosis not present

## 2019-01-31 DIAGNOSIS — R41841 Cognitive communication deficit: Secondary | ICD-10-CM | POA: Diagnosis not present

## 2019-01-31 DIAGNOSIS — M6281 Muscle weakness (generalized): Secondary | ICD-10-CM | POA: Diagnosis not present

## 2019-01-31 DIAGNOSIS — R262 Difficulty in walking, not elsewhere classified: Secondary | ICD-10-CM | POA: Diagnosis not present

## 2019-01-31 DIAGNOSIS — C44729 Squamous cell carcinoma of skin of left lower limb, including hip: Secondary | ICD-10-CM | POA: Diagnosis not present

## 2019-02-06 DIAGNOSIS — R41841 Cognitive communication deficit: Secondary | ICD-10-CM | POA: Diagnosis not present

## 2019-02-06 DIAGNOSIS — M25562 Pain in left knee: Secondary | ICD-10-CM | POA: Diagnosis not present

## 2019-02-06 DIAGNOSIS — R262 Difficulty in walking, not elsewhere classified: Secondary | ICD-10-CM | POA: Diagnosis not present

## 2019-02-06 DIAGNOSIS — M25561 Pain in right knee: Secondary | ICD-10-CM | POA: Diagnosis not present

## 2019-02-06 DIAGNOSIS — R488 Other symbolic dysfunctions: Secondary | ICD-10-CM | POA: Diagnosis not present

## 2019-02-06 DIAGNOSIS — M6281 Muscle weakness (generalized): Secondary | ICD-10-CM | POA: Diagnosis not present

## 2019-02-08 DIAGNOSIS — M6281 Muscle weakness (generalized): Secondary | ICD-10-CM | POA: Diagnosis not present

## 2019-02-08 DIAGNOSIS — R262 Difficulty in walking, not elsewhere classified: Secondary | ICD-10-CM | POA: Diagnosis not present

## 2019-02-08 DIAGNOSIS — R488 Other symbolic dysfunctions: Secondary | ICD-10-CM | POA: Diagnosis not present

## 2019-02-08 DIAGNOSIS — M25562 Pain in left knee: Secondary | ICD-10-CM | POA: Diagnosis not present

## 2019-02-08 DIAGNOSIS — M25561 Pain in right knee: Secondary | ICD-10-CM | POA: Diagnosis not present

## 2019-02-08 DIAGNOSIS — R41841 Cognitive communication deficit: Secondary | ICD-10-CM | POA: Diagnosis not present

## 2019-02-13 DIAGNOSIS — R41841 Cognitive communication deficit: Secondary | ICD-10-CM | POA: Diagnosis not present

## 2019-02-15 DIAGNOSIS — R41841 Cognitive communication deficit: Secondary | ICD-10-CM | POA: Diagnosis not present

## 2019-02-21 DIAGNOSIS — R41841 Cognitive communication deficit: Secondary | ICD-10-CM | POA: Diagnosis not present

## 2019-02-26 DIAGNOSIS — R41841 Cognitive communication deficit: Secondary | ICD-10-CM | POA: Diagnosis not present

## 2019-03-05 ENCOUNTER — Ambulatory Visit: Payer: Medicare PPO | Admitting: Neurology

## 2019-03-05 DIAGNOSIS — R41841 Cognitive communication deficit: Secondary | ICD-10-CM | POA: Diagnosis not present

## 2019-03-06 DIAGNOSIS — R41841 Cognitive communication deficit: Secondary | ICD-10-CM | POA: Diagnosis not present

## 2019-03-21 ENCOUNTER — Other Ambulatory Visit: Payer: Self-pay | Admitting: Neurology

## 2019-03-21 NOTE — Telephone Encounter (Signed)
Meagan, this isn't my patient.  Make sure name on RX is changed

## 2019-04-12 ENCOUNTER — Telehealth: Payer: Self-pay | Admitting: Cardiology

## 2019-04-12 DIAGNOSIS — R488 Other symbolic dysfunctions: Secondary | ICD-10-CM | POA: Diagnosis not present

## 2019-04-12 NOTE — Telephone Encounter (Signed)
New Message:    Patient daughter calling concering her about her mother being of some medication. The patient is in a Nursing Home. Daughter calling to report BP 134/68 pulse 61. If there are any questions, please call her daughter.

## 2019-04-12 NOTE — Telephone Encounter (Signed)
Daughter just calling to report BP 134/68 and HR 61. She states patient has felt fine since being off of the metoprolol. Denies dizziness, lightheadedness, syncope or any other Sx. Instructed for patient to let us know if this changes.

## 2019-04-17 DIAGNOSIS — R488 Other symbolic dysfunctions: Secondary | ICD-10-CM | POA: Diagnosis not present

## 2019-04-18 DIAGNOSIS — R488 Other symbolic dysfunctions: Secondary | ICD-10-CM | POA: Diagnosis not present

## 2019-04-20 DIAGNOSIS — R488 Other symbolic dysfunctions: Secondary | ICD-10-CM | POA: Diagnosis not present

## 2019-04-23 DIAGNOSIS — R488 Other symbolic dysfunctions: Secondary | ICD-10-CM | POA: Diagnosis not present

## 2019-04-24 DIAGNOSIS — R488 Other symbolic dysfunctions: Secondary | ICD-10-CM | POA: Diagnosis not present

## 2019-04-25 DIAGNOSIS — R488 Other symbolic dysfunctions: Secondary | ICD-10-CM | POA: Diagnosis not present

## 2019-04-27 DIAGNOSIS — R488 Other symbolic dysfunctions: Secondary | ICD-10-CM | POA: Diagnosis not present

## 2019-04-30 DIAGNOSIS — R488 Other symbolic dysfunctions: Secondary | ICD-10-CM | POA: Diagnosis not present

## 2019-05-09 DIAGNOSIS — R488 Other symbolic dysfunctions: Secondary | ICD-10-CM | POA: Diagnosis not present

## 2019-05-10 DIAGNOSIS — R488 Other symbolic dysfunctions: Secondary | ICD-10-CM | POA: Diagnosis not present

## 2019-06-12 ENCOUNTER — Telehealth: Payer: Self-pay | Admitting: Neurology

## 2019-06-12 NOTE — Telephone Encounter (Signed)
Daughter, Junie Panning informed. She will d/c the Lexapro and call if any negative changes.

## 2019-06-12 NOTE — Telephone Encounter (Signed)
Patient daughter needs to know if she still needs to be taking the lexapro please call

## 2019-06-12 NOTE — Telephone Encounter (Signed)
Pls let daughter know I am happy she is doing well. She is on a very low dose of Lexapro, can just stop it and let us know if any changes. Thanks!

## 2019-06-12 NOTE — Telephone Encounter (Signed)
Spoke with pt's daughter Junie Panning?) She states that pt is doing well. Her sleep is ok and anxiety is a little better. She believes it has been helpful for the pts spouse to move to a different facility. This has helped the pt not be as worried.  The daughter is wanting to know if the pt should continue on Lexapro. If now how does she need to taper off of it?

## 2019-06-12 NOTE — Telephone Encounter (Signed)
Per 07/2018 visit Delice Lesch wanted pt to d/c Lexapro if pt tolerated Aricept.

## 2019-06-19 DIAGNOSIS — R2689 Other abnormalities of gait and mobility: Secondary | ICD-10-CM | POA: Diagnosis not present

## 2019-06-19 DIAGNOSIS — R488 Other symbolic dysfunctions: Secondary | ICD-10-CM | POA: Diagnosis not present

## 2019-06-19 DIAGNOSIS — M545 Low back pain: Secondary | ICD-10-CM | POA: Diagnosis not present

## 2019-06-21 DIAGNOSIS — M545 Low back pain: Secondary | ICD-10-CM | POA: Diagnosis not present

## 2019-06-21 DIAGNOSIS — R488 Other symbolic dysfunctions: Secondary | ICD-10-CM | POA: Diagnosis not present

## 2019-06-21 DIAGNOSIS — R2689 Other abnormalities of gait and mobility: Secondary | ICD-10-CM | POA: Diagnosis not present

## 2019-06-26 DIAGNOSIS — R488 Other symbolic dysfunctions: Secondary | ICD-10-CM | POA: Diagnosis not present

## 2019-06-26 DIAGNOSIS — R2689 Other abnormalities of gait and mobility: Secondary | ICD-10-CM | POA: Diagnosis not present

## 2019-06-26 DIAGNOSIS — M545 Low back pain: Secondary | ICD-10-CM | POA: Diagnosis not present

## 2019-06-28 DIAGNOSIS — R32 Unspecified urinary incontinence: Secondary | ICD-10-CM | POA: Diagnosis not present

## 2019-06-28 DIAGNOSIS — M810 Age-related osteoporosis without current pathological fracture: Secondary | ICD-10-CM | POA: Diagnosis not present

## 2019-06-28 DIAGNOSIS — Z0000 Encounter for general adult medical examination without abnormal findings: Secondary | ICD-10-CM | POA: Diagnosis not present

## 2019-06-28 DIAGNOSIS — F0390 Unspecified dementia without behavioral disturbance: Secondary | ICD-10-CM | POA: Diagnosis not present

## 2019-06-28 DIAGNOSIS — Z1389 Encounter for screening for other disorder: Secondary | ICD-10-CM | POA: Diagnosis not present

## 2019-06-29 DIAGNOSIS — R32 Unspecified urinary incontinence: Secondary | ICD-10-CM | POA: Diagnosis not present

## 2019-07-13 DIAGNOSIS — D485 Neoplasm of uncertain behavior of skin: Secondary | ICD-10-CM | POA: Diagnosis not present

## 2019-07-13 DIAGNOSIS — Z85828 Personal history of other malignant neoplasm of skin: Secondary | ICD-10-CM | POA: Diagnosis not present

## 2019-07-13 DIAGNOSIS — C44329 Squamous cell carcinoma of skin of other parts of face: Secondary | ICD-10-CM | POA: Diagnosis not present

## 2019-10-02 DIAGNOSIS — H26493 Other secondary cataract, bilateral: Secondary | ICD-10-CM | POA: Diagnosis not present

## 2019-10-02 DIAGNOSIS — H43813 Vitreous degeneration, bilateral: Secondary | ICD-10-CM | POA: Diagnosis not present

## 2019-10-02 DIAGNOSIS — H524 Presbyopia: Secondary | ICD-10-CM | POA: Diagnosis not present

## 2019-10-02 DIAGNOSIS — H52203 Unspecified astigmatism, bilateral: Secondary | ICD-10-CM | POA: Diagnosis not present

## 2019-10-12 ENCOUNTER — Ambulatory Visit: Payer: Medicare PPO | Admitting: Neurology

## 2019-10-25 DIAGNOSIS — H26492 Other secondary cataract, left eye: Secondary | ICD-10-CM | POA: Diagnosis not present

## 2019-10-25 DIAGNOSIS — H26493 Other secondary cataract, bilateral: Secondary | ICD-10-CM | POA: Diagnosis not present

## 2019-11-16 DIAGNOSIS — R488 Other symbolic dysfunctions: Secondary | ICD-10-CM | POA: Diagnosis not present

## 2019-11-19 DIAGNOSIS — R488 Other symbolic dysfunctions: Secondary | ICD-10-CM | POA: Diagnosis not present

## 2019-11-20 DIAGNOSIS — R488 Other symbolic dysfunctions: Secondary | ICD-10-CM | POA: Diagnosis not present

## 2019-11-22 DIAGNOSIS — R488 Other symbolic dysfunctions: Secondary | ICD-10-CM | POA: Diagnosis not present

## 2019-11-27 DIAGNOSIS — R488 Other symbolic dysfunctions: Secondary | ICD-10-CM | POA: Diagnosis not present

## 2019-11-28 DIAGNOSIS — R488 Other symbolic dysfunctions: Secondary | ICD-10-CM | POA: Diagnosis not present

## 2019-11-28 DIAGNOSIS — R2689 Other abnormalities of gait and mobility: Secondary | ICD-10-CM | POA: Diagnosis not present

## 2019-11-28 DIAGNOSIS — M25561 Pain in right knee: Secondary | ICD-10-CM | POA: Diagnosis not present

## 2019-11-28 DIAGNOSIS — M6281 Muscle weakness (generalized): Secondary | ICD-10-CM | POA: Diagnosis not present

## 2019-11-29 DIAGNOSIS — R2689 Other abnormalities of gait and mobility: Secondary | ICD-10-CM | POA: Diagnosis not present

## 2019-11-29 DIAGNOSIS — R488 Other symbolic dysfunctions: Secondary | ICD-10-CM | POA: Diagnosis not present

## 2019-11-29 DIAGNOSIS — M6281 Muscle weakness (generalized): Secondary | ICD-10-CM | POA: Diagnosis not present

## 2019-11-29 DIAGNOSIS — M25561 Pain in right knee: Secondary | ICD-10-CM | POA: Diagnosis not present

## 2019-12-03 DIAGNOSIS — R488 Other symbolic dysfunctions: Secondary | ICD-10-CM | POA: Diagnosis not present

## 2019-12-04 DIAGNOSIS — R2689 Other abnormalities of gait and mobility: Secondary | ICD-10-CM | POA: Diagnosis not present

## 2019-12-04 DIAGNOSIS — M6281 Muscle weakness (generalized): Secondary | ICD-10-CM | POA: Diagnosis not present

## 2019-12-04 DIAGNOSIS — M25561 Pain in right knee: Secondary | ICD-10-CM | POA: Diagnosis not present

## 2019-12-04 DIAGNOSIS — R488 Other symbolic dysfunctions: Secondary | ICD-10-CM | POA: Diagnosis not present

## 2019-12-05 DIAGNOSIS — M6281 Muscle weakness (generalized): Secondary | ICD-10-CM | POA: Diagnosis not present

## 2019-12-05 DIAGNOSIS — R488 Other symbolic dysfunctions: Secondary | ICD-10-CM | POA: Diagnosis not present

## 2019-12-05 DIAGNOSIS — M25561 Pain in right knee: Secondary | ICD-10-CM | POA: Diagnosis not present

## 2019-12-05 DIAGNOSIS — R2689 Other abnormalities of gait and mobility: Secondary | ICD-10-CM | POA: Diagnosis not present

## 2019-12-10 DIAGNOSIS — R488 Other symbolic dysfunctions: Secondary | ICD-10-CM | POA: Diagnosis not present

## 2019-12-10 DIAGNOSIS — M25561 Pain in right knee: Secondary | ICD-10-CM | POA: Diagnosis not present

## 2019-12-10 DIAGNOSIS — R2689 Other abnormalities of gait and mobility: Secondary | ICD-10-CM | POA: Diagnosis not present

## 2019-12-10 DIAGNOSIS — M6281 Muscle weakness (generalized): Secondary | ICD-10-CM | POA: Diagnosis not present

## 2019-12-11 DIAGNOSIS — T148XXA Other injury of unspecified body region, initial encounter: Secondary | ICD-10-CM | POA: Diagnosis not present

## 2019-12-11 DIAGNOSIS — R410 Disorientation, unspecified: Secondary | ICD-10-CM | POA: Diagnosis not present

## 2019-12-11 DIAGNOSIS — R488 Other symbolic dysfunctions: Secondary | ICD-10-CM | POA: Diagnosis not present

## 2019-12-11 DIAGNOSIS — R300 Dysuria: Secondary | ICD-10-CM | POA: Diagnosis not present

## 2019-12-12 DIAGNOSIS — M25561 Pain in right knee: Secondary | ICD-10-CM | POA: Diagnosis not present

## 2019-12-12 DIAGNOSIS — M6281 Muscle weakness (generalized): Secondary | ICD-10-CM | POA: Diagnosis not present

## 2019-12-12 DIAGNOSIS — R488 Other symbolic dysfunctions: Secondary | ICD-10-CM | POA: Diagnosis not present

## 2019-12-12 DIAGNOSIS — R2689 Other abnormalities of gait and mobility: Secondary | ICD-10-CM | POA: Diagnosis not present

## 2019-12-13 DIAGNOSIS — R488 Other symbolic dysfunctions: Secondary | ICD-10-CM | POA: Diagnosis not present

## 2019-12-17 DIAGNOSIS — R488 Other symbolic dysfunctions: Secondary | ICD-10-CM | POA: Diagnosis not present

## 2019-12-18 DIAGNOSIS — M6281 Muscle weakness (generalized): Secondary | ICD-10-CM | POA: Diagnosis not present

## 2019-12-18 DIAGNOSIS — R488 Other symbolic dysfunctions: Secondary | ICD-10-CM | POA: Diagnosis not present

## 2019-12-18 DIAGNOSIS — R2689 Other abnormalities of gait and mobility: Secondary | ICD-10-CM | POA: Diagnosis not present

## 2019-12-18 DIAGNOSIS — M25561 Pain in right knee: Secondary | ICD-10-CM | POA: Diagnosis not present

## 2019-12-19 DIAGNOSIS — R488 Other symbolic dysfunctions: Secondary | ICD-10-CM | POA: Diagnosis not present

## 2019-12-20 DIAGNOSIS — R2689 Other abnormalities of gait and mobility: Secondary | ICD-10-CM | POA: Diagnosis not present

## 2019-12-20 DIAGNOSIS — R488 Other symbolic dysfunctions: Secondary | ICD-10-CM | POA: Diagnosis not present

## 2019-12-20 DIAGNOSIS — M25561 Pain in right knee: Secondary | ICD-10-CM | POA: Diagnosis not present

## 2019-12-20 DIAGNOSIS — M6281 Muscle weakness (generalized): Secondary | ICD-10-CM | POA: Diagnosis not present

## 2019-12-24 DIAGNOSIS — R488 Other symbolic dysfunctions: Secondary | ICD-10-CM | POA: Diagnosis not present

## 2019-12-28 DIAGNOSIS — R488 Other symbolic dysfunctions: Secondary | ICD-10-CM | POA: Diagnosis not present

## 2020-01-02 DIAGNOSIS — R488 Other symbolic dysfunctions: Secondary | ICD-10-CM | POA: Diagnosis not present

## 2020-01-02 DIAGNOSIS — R2689 Other abnormalities of gait and mobility: Secondary | ICD-10-CM | POA: Diagnosis not present

## 2020-01-02 DIAGNOSIS — M25561 Pain in right knee: Secondary | ICD-10-CM | POA: Diagnosis not present

## 2020-01-02 DIAGNOSIS — M6281 Muscle weakness (generalized): Secondary | ICD-10-CM | POA: Diagnosis not present

## 2020-01-03 DIAGNOSIS — M6281 Muscle weakness (generalized): Secondary | ICD-10-CM | POA: Diagnosis not present

## 2020-01-03 DIAGNOSIS — R3981 Functional urinary incontinence: Secondary | ICD-10-CM | POA: Diagnosis not present

## 2020-01-03 DIAGNOSIS — R2689 Other abnormalities of gait and mobility: Secondary | ICD-10-CM | POA: Diagnosis not present

## 2020-01-03 DIAGNOSIS — R410 Disorientation, unspecified: Secondary | ICD-10-CM | POA: Diagnosis not present

## 2020-01-03 DIAGNOSIS — M25561 Pain in right knee: Secondary | ICD-10-CM | POA: Diagnosis not present

## 2020-01-03 DIAGNOSIS — R488 Other symbolic dysfunctions: Secondary | ICD-10-CM | POA: Diagnosis not present

## 2020-01-03 DIAGNOSIS — R29898 Other symptoms and signs involving the musculoskeletal system: Secondary | ICD-10-CM | POA: Diagnosis not present

## 2020-01-04 ENCOUNTER — Ambulatory Visit
Admission: RE | Admit: 2020-01-04 | Discharge: 2020-01-04 | Disposition: A | Discharge status: Home / Self Care | Payer: Medicare PPO | Source: Ambulatory Visit | Attending: Internal Medicine | Admitting: Internal Medicine

## 2020-01-04 ENCOUNTER — Other Ambulatory Visit: Payer: Self-pay | Admitting: Internal Medicine

## 2020-01-04 DIAGNOSIS — R29898 Other symptoms and signs involving the musculoskeletal system: Secondary | ICD-10-CM | POA: Diagnosis not present

## 2020-01-04 DIAGNOSIS — R531 Weakness: Secondary | ICD-10-CM | POA: Diagnosis not present

## 2020-01-04 DIAGNOSIS — R488 Other symbolic dysfunctions: Secondary | ICD-10-CM | POA: Diagnosis not present

## 2020-01-07 DIAGNOSIS — R488 Other symbolic dysfunctions: Secondary | ICD-10-CM | POA: Diagnosis not present

## 2020-01-07 DIAGNOSIS — M6281 Muscle weakness (generalized): Secondary | ICD-10-CM | POA: Diagnosis not present

## 2020-01-07 DIAGNOSIS — R2681 Unsteadiness on feet: Secondary | ICD-10-CM | POA: Diagnosis not present

## 2020-01-07 DIAGNOSIS — I69398 Other sequelae of cerebral infarction: Secondary | ICD-10-CM | POA: Diagnosis not present

## 2020-01-07 DIAGNOSIS — R278 Other lack of coordination: Secondary | ICD-10-CM | POA: Diagnosis not present

## 2020-01-10 DIAGNOSIS — M6281 Muscle weakness (generalized): Secondary | ICD-10-CM | POA: Diagnosis not present

## 2020-01-10 DIAGNOSIS — I69398 Other sequelae of cerebral infarction: Secondary | ICD-10-CM | POA: Diagnosis not present

## 2020-01-10 DIAGNOSIS — R488 Other symbolic dysfunctions: Secondary | ICD-10-CM | POA: Diagnosis not present

## 2020-01-10 DIAGNOSIS — R278 Other lack of coordination: Secondary | ICD-10-CM | POA: Diagnosis not present

## 2020-01-10 DIAGNOSIS — R2681 Unsteadiness on feet: Secondary | ICD-10-CM | POA: Diagnosis not present

## 2020-01-11 DIAGNOSIS — Z8673 Personal history of transient ischemic attack (TIA), and cerebral infarction without residual deficits: Secondary | ICD-10-CM | POA: Diagnosis not present

## 2020-01-11 DIAGNOSIS — R278 Other lack of coordination: Secondary | ICD-10-CM | POA: Diagnosis not present

## 2020-01-11 DIAGNOSIS — M6281 Muscle weakness (generalized): Secondary | ICD-10-CM | POA: Diagnosis not present

## 2020-01-11 DIAGNOSIS — R488 Other symbolic dysfunctions: Secondary | ICD-10-CM | POA: Diagnosis not present

## 2020-01-11 DIAGNOSIS — R2681 Unsteadiness on feet: Secondary | ICD-10-CM | POA: Diagnosis not present

## 2020-01-11 DIAGNOSIS — I69398 Other sequelae of cerebral infarction: Secondary | ICD-10-CM | POA: Diagnosis not present

## 2020-01-14 DIAGNOSIS — I69398 Other sequelae of cerebral infarction: Secondary | ICD-10-CM | POA: Diagnosis not present

## 2020-01-14 DIAGNOSIS — R2681 Unsteadiness on feet: Secondary | ICD-10-CM | POA: Diagnosis not present

## 2020-01-14 DIAGNOSIS — R488 Other symbolic dysfunctions: Secondary | ICD-10-CM | POA: Diagnosis not present

## 2020-01-14 DIAGNOSIS — M6281 Muscle weakness (generalized): Secondary | ICD-10-CM | POA: Diagnosis not present

## 2020-01-14 DIAGNOSIS — R278 Other lack of coordination: Secondary | ICD-10-CM | POA: Diagnosis not present

## 2020-01-15 DIAGNOSIS — I69328 Other speech and language deficits following cerebral infarction: Secondary | ICD-10-CM | POA: Diagnosis not present

## 2020-01-15 DIAGNOSIS — R278 Other lack of coordination: Secondary | ICD-10-CM | POA: Diagnosis not present

## 2020-01-15 DIAGNOSIS — R488 Other symbolic dysfunctions: Secondary | ICD-10-CM | POA: Diagnosis not present

## 2020-01-15 DIAGNOSIS — I69398 Other sequelae of cerebral infarction: Secondary | ICD-10-CM | POA: Diagnosis not present

## 2020-01-15 DIAGNOSIS — M6281 Muscle weakness (generalized): Secondary | ICD-10-CM | POA: Diagnosis not present

## 2020-01-16 DIAGNOSIS — M6281 Muscle weakness (generalized): Secondary | ICD-10-CM | POA: Diagnosis not present

## 2020-01-16 DIAGNOSIS — R2681 Unsteadiness on feet: Secondary | ICD-10-CM | POA: Diagnosis not present

## 2020-01-16 DIAGNOSIS — I69398 Other sequelae of cerebral infarction: Secondary | ICD-10-CM | POA: Diagnosis not present

## 2020-01-16 DIAGNOSIS — R488 Other symbolic dysfunctions: Secondary | ICD-10-CM | POA: Diagnosis not present

## 2020-01-16 DIAGNOSIS — R278 Other lack of coordination: Secondary | ICD-10-CM | POA: Diagnosis not present

## 2020-01-17 DIAGNOSIS — R2681 Unsteadiness on feet: Secondary | ICD-10-CM | POA: Diagnosis not present

## 2020-01-17 DIAGNOSIS — M6281 Muscle weakness (generalized): Secondary | ICD-10-CM | POA: Diagnosis not present

## 2020-01-17 DIAGNOSIS — R278 Other lack of coordination: Secondary | ICD-10-CM | POA: Diagnosis not present

## 2020-01-17 DIAGNOSIS — R488 Other symbolic dysfunctions: Secondary | ICD-10-CM | POA: Diagnosis not present

## 2020-01-17 DIAGNOSIS — I69398 Other sequelae of cerebral infarction: Secondary | ICD-10-CM | POA: Diagnosis not present

## 2020-01-18 DIAGNOSIS — R2681 Unsteadiness on feet: Secondary | ICD-10-CM | POA: Diagnosis not present

## 2020-01-18 DIAGNOSIS — I69398 Other sequelae of cerebral infarction: Secondary | ICD-10-CM | POA: Diagnosis not present

## 2020-01-18 DIAGNOSIS — M62830 Muscle spasm of back: Secondary | ICD-10-CM | POA: Diagnosis not present

## 2020-01-18 DIAGNOSIS — R278 Other lack of coordination: Secondary | ICD-10-CM | POA: Diagnosis not present

## 2020-01-18 DIAGNOSIS — R488 Other symbolic dysfunctions: Secondary | ICD-10-CM | POA: Diagnosis not present

## 2020-01-18 DIAGNOSIS — M6281 Muscle weakness (generalized): Secondary | ICD-10-CM | POA: Diagnosis not present

## 2020-01-18 DIAGNOSIS — M549 Dorsalgia, unspecified: Secondary | ICD-10-CM | POA: Diagnosis not present

## 2020-01-21 DIAGNOSIS — R488 Other symbolic dysfunctions: Secondary | ICD-10-CM | POA: Diagnosis not present

## 2020-01-21 DIAGNOSIS — I69398 Other sequelae of cerebral infarction: Secondary | ICD-10-CM | POA: Diagnosis not present

## 2020-01-21 DIAGNOSIS — M6281 Muscle weakness (generalized): Secondary | ICD-10-CM | POA: Diagnosis not present

## 2020-01-21 DIAGNOSIS — R2681 Unsteadiness on feet: Secondary | ICD-10-CM | POA: Diagnosis not present

## 2020-01-21 DIAGNOSIS — R278 Other lack of coordination: Secondary | ICD-10-CM | POA: Diagnosis not present

## 2020-01-22 DIAGNOSIS — R488 Other symbolic dysfunctions: Secondary | ICD-10-CM | POA: Diagnosis not present

## 2020-01-22 DIAGNOSIS — R2681 Unsteadiness on feet: Secondary | ICD-10-CM | POA: Diagnosis not present

## 2020-01-22 DIAGNOSIS — R278 Other lack of coordination: Secondary | ICD-10-CM | POA: Diagnosis not present

## 2020-01-22 DIAGNOSIS — I69398 Other sequelae of cerebral infarction: Secondary | ICD-10-CM | POA: Diagnosis not present

## 2020-01-22 DIAGNOSIS — M6281 Muscle weakness (generalized): Secondary | ICD-10-CM | POA: Diagnosis not present

## 2020-01-23 DIAGNOSIS — R488 Other symbolic dysfunctions: Secondary | ICD-10-CM | POA: Diagnosis not present

## 2020-01-23 DIAGNOSIS — R278 Other lack of coordination: Secondary | ICD-10-CM | POA: Diagnosis not present

## 2020-01-23 DIAGNOSIS — I69328 Other speech and language deficits following cerebral infarction: Secondary | ICD-10-CM | POA: Diagnosis not present

## 2020-01-23 DIAGNOSIS — I69398 Other sequelae of cerebral infarction: Secondary | ICD-10-CM | POA: Diagnosis not present

## 2020-01-23 DIAGNOSIS — R2681 Unsteadiness on feet: Secondary | ICD-10-CM | POA: Diagnosis not present

## 2020-01-23 DIAGNOSIS — M6281 Muscle weakness (generalized): Secondary | ICD-10-CM | POA: Diagnosis not present

## 2020-01-24 DIAGNOSIS — R2681 Unsteadiness on feet: Secondary | ICD-10-CM | POA: Diagnosis not present

## 2020-01-24 DIAGNOSIS — I69398 Other sequelae of cerebral infarction: Secondary | ICD-10-CM | POA: Diagnosis not present

## 2020-01-24 DIAGNOSIS — R278 Other lack of coordination: Secondary | ICD-10-CM | POA: Diagnosis not present

## 2020-01-24 DIAGNOSIS — M6281 Muscle weakness (generalized): Secondary | ICD-10-CM | POA: Diagnosis not present

## 2020-01-24 DIAGNOSIS — R488 Other symbolic dysfunctions: Secondary | ICD-10-CM | POA: Diagnosis not present

## 2020-01-25 DIAGNOSIS — R488 Other symbolic dysfunctions: Secondary | ICD-10-CM | POA: Diagnosis not present

## 2020-01-25 DIAGNOSIS — R2681 Unsteadiness on feet: Secondary | ICD-10-CM | POA: Diagnosis not present

## 2020-01-25 DIAGNOSIS — M6281 Muscle weakness (generalized): Secondary | ICD-10-CM | POA: Diagnosis not present

## 2020-01-25 DIAGNOSIS — R278 Other lack of coordination: Secondary | ICD-10-CM | POA: Diagnosis not present

## 2020-01-25 DIAGNOSIS — I69398 Other sequelae of cerebral infarction: Secondary | ICD-10-CM | POA: Diagnosis not present

## 2020-01-28 DIAGNOSIS — I69398 Other sequelae of cerebral infarction: Secondary | ICD-10-CM | POA: Diagnosis not present

## 2020-01-28 DIAGNOSIS — M6281 Muscle weakness (generalized): Secondary | ICD-10-CM | POA: Diagnosis not present

## 2020-01-28 DIAGNOSIS — I69328 Other speech and language deficits following cerebral infarction: Secondary | ICD-10-CM | POA: Diagnosis not present

## 2020-01-28 DIAGNOSIS — R2681 Unsteadiness on feet: Secondary | ICD-10-CM | POA: Diagnosis not present

## 2020-01-28 DIAGNOSIS — R278 Other lack of coordination: Secondary | ICD-10-CM | POA: Diagnosis not present

## 2020-01-28 DIAGNOSIS — R488 Other symbolic dysfunctions: Secondary | ICD-10-CM | POA: Diagnosis not present

## 2020-01-30 DIAGNOSIS — Z8673 Personal history of transient ischemic attack (TIA), and cerebral infarction without residual deficits: Secondary | ICD-10-CM | POA: Diagnosis not present

## 2020-01-30 DIAGNOSIS — I69398 Other sequelae of cerebral infarction: Secondary | ICD-10-CM | POA: Diagnosis not present

## 2020-01-30 DIAGNOSIS — R2681 Unsteadiness on feet: Secondary | ICD-10-CM | POA: Diagnosis not present

## 2020-01-30 DIAGNOSIS — M6281 Muscle weakness (generalized): Secondary | ICD-10-CM | POA: Diagnosis not present

## 2020-01-31 DIAGNOSIS — I69328 Other speech and language deficits following cerebral infarction: Secondary | ICD-10-CM | POA: Diagnosis not present

## 2020-02-01 DIAGNOSIS — M6281 Muscle weakness (generalized): Secondary | ICD-10-CM | POA: Diagnosis not present

## 2020-02-01 DIAGNOSIS — R2681 Unsteadiness on feet: Secondary | ICD-10-CM | POA: Diagnosis not present

## 2020-02-01 DIAGNOSIS — I69328 Other speech and language deficits following cerebral infarction: Secondary | ICD-10-CM | POA: Diagnosis not present

## 2020-02-01 DIAGNOSIS — I69398 Other sequelae of cerebral infarction: Secondary | ICD-10-CM | POA: Diagnosis not present

## 2020-02-04 DIAGNOSIS — M6281 Muscle weakness (generalized): Secondary | ICD-10-CM | POA: Diagnosis not present

## 2020-02-04 DIAGNOSIS — I69328 Other speech and language deficits following cerebral infarction: Secondary | ICD-10-CM | POA: Diagnosis not present

## 2020-02-04 DIAGNOSIS — R2681 Unsteadiness on feet: Secondary | ICD-10-CM | POA: Diagnosis not present

## 2020-02-04 DIAGNOSIS — R488 Other symbolic dysfunctions: Secondary | ICD-10-CM | POA: Diagnosis not present

## 2020-02-04 DIAGNOSIS — I69398 Other sequelae of cerebral infarction: Secondary | ICD-10-CM | POA: Diagnosis not present

## 2020-02-04 DIAGNOSIS — R278 Other lack of coordination: Secondary | ICD-10-CM | POA: Diagnosis not present

## 2020-02-05 DIAGNOSIS — I69398 Other sequelae of cerebral infarction: Secondary | ICD-10-CM | POA: Diagnosis not present

## 2020-02-05 DIAGNOSIS — R2681 Unsteadiness on feet: Secondary | ICD-10-CM | POA: Diagnosis not present

## 2020-02-05 DIAGNOSIS — R278 Other lack of coordination: Secondary | ICD-10-CM | POA: Diagnosis not present

## 2020-02-05 DIAGNOSIS — M6281 Muscle weakness (generalized): Secondary | ICD-10-CM | POA: Diagnosis not present

## 2020-02-05 DIAGNOSIS — R488 Other symbolic dysfunctions: Secondary | ICD-10-CM | POA: Diagnosis not present

## 2020-02-06 DIAGNOSIS — M6281 Muscle weakness (generalized): Secondary | ICD-10-CM | POA: Diagnosis not present

## 2020-02-06 DIAGNOSIS — R488 Other symbolic dysfunctions: Secondary | ICD-10-CM | POA: Diagnosis not present

## 2020-02-06 DIAGNOSIS — I69398 Other sequelae of cerebral infarction: Secondary | ICD-10-CM | POA: Diagnosis not present

## 2020-02-06 DIAGNOSIS — R278 Other lack of coordination: Secondary | ICD-10-CM | POA: Diagnosis not present

## 2020-02-07 DIAGNOSIS — R278 Other lack of coordination: Secondary | ICD-10-CM | POA: Diagnosis not present

## 2020-02-07 DIAGNOSIS — M6281 Muscle weakness (generalized): Secondary | ICD-10-CM | POA: Diagnosis not present

## 2020-02-07 DIAGNOSIS — R2681 Unsteadiness on feet: Secondary | ICD-10-CM | POA: Diagnosis not present

## 2020-02-07 DIAGNOSIS — I69398 Other sequelae of cerebral infarction: Secondary | ICD-10-CM | POA: Diagnosis not present

## 2020-02-07 DIAGNOSIS — R488 Other symbolic dysfunctions: Secondary | ICD-10-CM | POA: Diagnosis not present

## 2020-02-08 DIAGNOSIS — M6281 Muscle weakness (generalized): Secondary | ICD-10-CM | POA: Diagnosis not present

## 2020-02-08 DIAGNOSIS — R488 Other symbolic dysfunctions: Secondary | ICD-10-CM | POA: Diagnosis not present

## 2020-02-08 DIAGNOSIS — I69398 Other sequelae of cerebral infarction: Secondary | ICD-10-CM | POA: Diagnosis not present

## 2020-02-08 DIAGNOSIS — R278 Other lack of coordination: Secondary | ICD-10-CM | POA: Diagnosis not present

## 2020-02-11 DIAGNOSIS — R278 Other lack of coordination: Secondary | ICD-10-CM | POA: Diagnosis not present

## 2020-02-11 DIAGNOSIS — R488 Other symbolic dysfunctions: Secondary | ICD-10-CM | POA: Diagnosis not present

## 2020-02-11 DIAGNOSIS — I69398 Other sequelae of cerebral infarction: Secondary | ICD-10-CM | POA: Diagnosis not present

## 2020-02-11 DIAGNOSIS — M6281 Muscle weakness (generalized): Secondary | ICD-10-CM | POA: Diagnosis not present

## 2020-02-11 DIAGNOSIS — R2681 Unsteadiness on feet: Secondary | ICD-10-CM | POA: Diagnosis not present

## 2020-02-12 DIAGNOSIS — M6281 Muscle weakness (generalized): Secondary | ICD-10-CM | POA: Diagnosis not present

## 2020-02-12 DIAGNOSIS — I69398 Other sequelae of cerebral infarction: Secondary | ICD-10-CM | POA: Diagnosis not present

## 2020-02-12 DIAGNOSIS — R488 Other symbolic dysfunctions: Secondary | ICD-10-CM | POA: Diagnosis not present

## 2020-02-12 DIAGNOSIS — R2681 Unsteadiness on feet: Secondary | ICD-10-CM | POA: Diagnosis not present

## 2020-02-12 DIAGNOSIS — I69328 Other speech and language deficits following cerebral infarction: Secondary | ICD-10-CM | POA: Diagnosis not present

## 2020-02-12 DIAGNOSIS — R278 Other lack of coordination: Secondary | ICD-10-CM | POA: Diagnosis not present

## 2020-02-13 DIAGNOSIS — R488 Other symbolic dysfunctions: Secondary | ICD-10-CM | POA: Diagnosis not present

## 2020-02-13 DIAGNOSIS — R278 Other lack of coordination: Secondary | ICD-10-CM | POA: Diagnosis not present

## 2020-02-13 DIAGNOSIS — R2681 Unsteadiness on feet: Secondary | ICD-10-CM | POA: Diagnosis not present

## 2020-02-13 DIAGNOSIS — M6281 Muscle weakness (generalized): Secondary | ICD-10-CM | POA: Diagnosis not present

## 2020-02-13 DIAGNOSIS — I69398 Other sequelae of cerebral infarction: Secondary | ICD-10-CM | POA: Diagnosis not present

## 2020-02-14 DIAGNOSIS — M6281 Muscle weakness (generalized): Secondary | ICD-10-CM | POA: Diagnosis not present

## 2020-02-14 DIAGNOSIS — R278 Other lack of coordination: Secondary | ICD-10-CM | POA: Diagnosis not present

## 2020-02-14 DIAGNOSIS — R2681 Unsteadiness on feet: Secondary | ICD-10-CM | POA: Diagnosis not present

## 2020-02-14 DIAGNOSIS — I69398 Other sequelae of cerebral infarction: Secondary | ICD-10-CM | POA: Diagnosis not present

## 2020-02-14 DIAGNOSIS — R488 Other symbolic dysfunctions: Secondary | ICD-10-CM | POA: Diagnosis not present

## 2020-02-15 DIAGNOSIS — M6281 Muscle weakness (generalized): Secondary | ICD-10-CM | POA: Diagnosis not present

## 2020-02-15 DIAGNOSIS — I69328 Other speech and language deficits following cerebral infarction: Secondary | ICD-10-CM | POA: Diagnosis not present

## 2020-02-15 DIAGNOSIS — R488 Other symbolic dysfunctions: Secondary | ICD-10-CM | POA: Diagnosis not present

## 2020-02-15 DIAGNOSIS — R2681 Unsteadiness on feet: Secondary | ICD-10-CM | POA: Diagnosis not present

## 2020-02-15 DIAGNOSIS — R278 Other lack of coordination: Secondary | ICD-10-CM | POA: Diagnosis not present

## 2020-02-15 DIAGNOSIS — I69398 Other sequelae of cerebral infarction: Secondary | ICD-10-CM | POA: Diagnosis not present

## 2020-02-16 DIAGNOSIS — I69328 Other speech and language deficits following cerebral infarction: Secondary | ICD-10-CM | POA: Diagnosis not present

## 2020-02-18 DIAGNOSIS — R2681 Unsteadiness on feet: Secondary | ICD-10-CM | POA: Diagnosis not present

## 2020-02-18 DIAGNOSIS — M6281 Muscle weakness (generalized): Secondary | ICD-10-CM | POA: Diagnosis not present

## 2020-02-18 DIAGNOSIS — R488 Other symbolic dysfunctions: Secondary | ICD-10-CM | POA: Diagnosis not present

## 2020-02-18 DIAGNOSIS — I69398 Other sequelae of cerebral infarction: Secondary | ICD-10-CM | POA: Diagnosis not present

## 2020-02-18 DIAGNOSIS — R278 Other lack of coordination: Secondary | ICD-10-CM | POA: Diagnosis not present

## 2020-02-19 DIAGNOSIS — I69398 Other sequelae of cerebral infarction: Secondary | ICD-10-CM | POA: Diagnosis not present

## 2020-02-19 DIAGNOSIS — R278 Other lack of coordination: Secondary | ICD-10-CM | POA: Diagnosis not present

## 2020-02-19 DIAGNOSIS — R2681 Unsteadiness on feet: Secondary | ICD-10-CM | POA: Diagnosis not present

## 2020-02-19 DIAGNOSIS — M6281 Muscle weakness (generalized): Secondary | ICD-10-CM | POA: Diagnosis not present

## 2020-02-19 DIAGNOSIS — R488 Other symbolic dysfunctions: Secondary | ICD-10-CM | POA: Diagnosis not present

## 2020-02-20 DIAGNOSIS — M6281 Muscle weakness (generalized): Secondary | ICD-10-CM | POA: Diagnosis not present

## 2020-02-20 DIAGNOSIS — I69398 Other sequelae of cerebral infarction: Secondary | ICD-10-CM | POA: Diagnosis not present

## 2020-02-20 DIAGNOSIS — R488 Other symbolic dysfunctions: Secondary | ICD-10-CM | POA: Diagnosis not present

## 2020-02-20 DIAGNOSIS — R278 Other lack of coordination: Secondary | ICD-10-CM | POA: Diagnosis not present

## 2020-02-21 DIAGNOSIS — I69328 Other speech and language deficits following cerebral infarction: Secondary | ICD-10-CM | POA: Diagnosis not present

## 2020-02-21 DIAGNOSIS — R278 Other lack of coordination: Secondary | ICD-10-CM | POA: Diagnosis not present

## 2020-02-21 DIAGNOSIS — R2681 Unsteadiness on feet: Secondary | ICD-10-CM | POA: Diagnosis not present

## 2020-02-21 DIAGNOSIS — R488 Other symbolic dysfunctions: Secondary | ICD-10-CM | POA: Diagnosis not present

## 2020-02-21 DIAGNOSIS — M6281 Muscle weakness (generalized): Secondary | ICD-10-CM | POA: Diagnosis not present

## 2020-02-21 DIAGNOSIS — I69398 Other sequelae of cerebral infarction: Secondary | ICD-10-CM | POA: Diagnosis not present

## 2020-02-22 DIAGNOSIS — I69398 Other sequelae of cerebral infarction: Secondary | ICD-10-CM | POA: Diagnosis not present

## 2020-02-22 DIAGNOSIS — R488 Other symbolic dysfunctions: Secondary | ICD-10-CM | POA: Diagnosis not present

## 2020-02-22 DIAGNOSIS — I69328 Other speech and language deficits following cerebral infarction: Secondary | ICD-10-CM | POA: Diagnosis not present

## 2020-02-22 DIAGNOSIS — R278 Other lack of coordination: Secondary | ICD-10-CM | POA: Diagnosis not present

## 2020-02-22 DIAGNOSIS — M6281 Muscle weakness (generalized): Secondary | ICD-10-CM | POA: Diagnosis not present

## 2020-02-23 DIAGNOSIS — I69328 Other speech and language deficits following cerebral infarction: Secondary | ICD-10-CM | POA: Diagnosis not present

## 2020-02-25 DIAGNOSIS — I69398 Other sequelae of cerebral infarction: Secondary | ICD-10-CM | POA: Diagnosis not present

## 2020-02-25 DIAGNOSIS — R488 Other symbolic dysfunctions: Secondary | ICD-10-CM | POA: Diagnosis not present

## 2020-02-25 DIAGNOSIS — R2681 Unsteadiness on feet: Secondary | ICD-10-CM | POA: Diagnosis not present

## 2020-02-25 DIAGNOSIS — M6281 Muscle weakness (generalized): Secondary | ICD-10-CM | POA: Diagnosis not present

## 2020-02-25 DIAGNOSIS — R278 Other lack of coordination: Secondary | ICD-10-CM | POA: Diagnosis not present

## 2020-02-26 DIAGNOSIS — R488 Other symbolic dysfunctions: Secondary | ICD-10-CM | POA: Diagnosis not present

## 2020-02-26 DIAGNOSIS — M6281 Muscle weakness (generalized): Secondary | ICD-10-CM | POA: Diagnosis not present

## 2020-02-26 DIAGNOSIS — I69398 Other sequelae of cerebral infarction: Secondary | ICD-10-CM | POA: Diagnosis not present

## 2020-02-26 DIAGNOSIS — R2681 Unsteadiness on feet: Secondary | ICD-10-CM | POA: Diagnosis not present

## 2020-02-26 DIAGNOSIS — R278 Other lack of coordination: Secondary | ICD-10-CM | POA: Diagnosis not present

## 2020-02-27 DIAGNOSIS — I69398 Other sequelae of cerebral infarction: Secondary | ICD-10-CM | POA: Diagnosis not present

## 2020-02-27 DIAGNOSIS — R278 Other lack of coordination: Secondary | ICD-10-CM | POA: Diagnosis not present

## 2020-02-27 DIAGNOSIS — M6281 Muscle weakness (generalized): Secondary | ICD-10-CM | POA: Diagnosis not present

## 2020-02-27 DIAGNOSIS — R488 Other symbolic dysfunctions: Secondary | ICD-10-CM | POA: Diagnosis not present

## 2020-02-27 DIAGNOSIS — I69328 Other speech and language deficits following cerebral infarction: Secondary | ICD-10-CM | POA: Diagnosis not present

## 2020-02-28 ENCOUNTER — Observation Stay (HOSPITAL_COMMUNITY): Payer: Medicare PPO

## 2020-02-28 ENCOUNTER — Emergency Department (HOSPITAL_COMMUNITY): Payer: Medicare PPO

## 2020-02-28 ENCOUNTER — Inpatient Hospital Stay (HOSPITAL_COMMUNITY)
Admission: EM | Admit: 2020-02-28 | Discharge: 2020-03-01 | DRG: 064 | Disposition: A | Discharge status: Home Health | Payer: Medicare PPO | Attending: Internal Medicine | Admitting: Internal Medicine

## 2020-02-28 DIAGNOSIS — Z823 Family history of stroke: Secondary | ICD-10-CM

## 2020-02-28 DIAGNOSIS — Z888 Allergy status to other drugs, medicaments and biological substances status: Secondary | ICD-10-CM | POA: Diagnosis not present

## 2020-02-28 DIAGNOSIS — Z20822 Contact with and (suspected) exposure to covid-19: Secondary | ICD-10-CM | POA: Diagnosis present

## 2020-02-28 DIAGNOSIS — G9341 Metabolic encephalopathy: Secondary | ICD-10-CM | POA: Diagnosis present

## 2020-02-28 DIAGNOSIS — I63412 Cerebral infarction due to embolism of left middle cerebral artery: Secondary | ICD-10-CM

## 2020-02-28 DIAGNOSIS — E785 Hyperlipidemia, unspecified: Secondary | ICD-10-CM | POA: Diagnosis present

## 2020-02-28 DIAGNOSIS — E7800 Pure hypercholesterolemia, unspecified: Secondary | ICD-10-CM | POA: Diagnosis present

## 2020-02-28 DIAGNOSIS — Z881 Allergy status to other antibiotic agents status: Secondary | ICD-10-CM

## 2020-02-28 DIAGNOSIS — Z8673 Personal history of transient ischemic attack (TIA), and cerebral infarction without residual deficits: Secondary | ICD-10-CM | POA: Diagnosis not present

## 2020-02-28 DIAGNOSIS — I6621 Occlusion and stenosis of right posterior cerebral artery: Secondary | ICD-10-CM | POA: Diagnosis not present

## 2020-02-28 DIAGNOSIS — R4182 Altered mental status, unspecified: Secondary | ICD-10-CM

## 2020-02-28 DIAGNOSIS — Z7982 Long term (current) use of aspirin: Secondary | ICD-10-CM | POA: Diagnosis not present

## 2020-02-28 DIAGNOSIS — R29810 Facial weakness: Secondary | ICD-10-CM | POA: Diagnosis not present

## 2020-02-28 DIAGNOSIS — I110 Hypertensive heart disease with heart failure: Secondary | ICD-10-CM | POA: Diagnosis present

## 2020-02-28 DIAGNOSIS — R4020 Unspecified coma: Secondary | ICD-10-CM | POA: Diagnosis not present

## 2020-02-28 DIAGNOSIS — I639 Cerebral infarction, unspecified: Secondary | ICD-10-CM | POA: Diagnosis present

## 2020-02-28 DIAGNOSIS — R531 Weakness: Secondary | ICD-10-CM | POA: Diagnosis not present

## 2020-02-28 DIAGNOSIS — I493 Ventricular premature depolarization: Secondary | ICD-10-CM | POA: Diagnosis present

## 2020-02-28 DIAGNOSIS — R4189 Other symptoms and signs involving cognitive functions and awareness: Secondary | ICD-10-CM

## 2020-02-28 DIAGNOSIS — I5032 Chronic diastolic (congestive) heart failure: Secondary | ICD-10-CM | POA: Diagnosis present

## 2020-02-28 DIAGNOSIS — Z96652 Presence of left artificial knee joint: Secondary | ICD-10-CM | POA: Diagnosis present

## 2020-02-28 DIAGNOSIS — I959 Hypotension, unspecified: Secondary | ICD-10-CM | POA: Diagnosis not present

## 2020-02-28 DIAGNOSIS — R404 Transient alteration of awareness: Secondary | ICD-10-CM | POA: Diagnosis not present

## 2020-02-28 DIAGNOSIS — R29708 NIHSS score 8: Secondary | ICD-10-CM | POA: Diagnosis present

## 2020-02-28 DIAGNOSIS — I6310 Cerebral infarction due to embolism of unspecified precerebral artery: Secondary | ICD-10-CM | POA: Diagnosis not present

## 2020-02-28 DIAGNOSIS — I6340 Cerebral infarction due to embolism of unspecified cerebral artery: Secondary | ICD-10-CM | POA: Diagnosis present

## 2020-02-28 DIAGNOSIS — R569 Unspecified convulsions: Secondary | ICD-10-CM | POA: Diagnosis not present

## 2020-02-28 DIAGNOSIS — F329 Major depressive disorder, single episode, unspecified: Secondary | ICD-10-CM | POA: Diagnosis present

## 2020-02-28 DIAGNOSIS — Z8261 Family history of arthritis: Secondary | ICD-10-CM | POA: Diagnosis not present

## 2020-02-28 DIAGNOSIS — I6389 Other cerebral infarction: Secondary | ICD-10-CM | POA: Diagnosis not present

## 2020-02-28 DIAGNOSIS — Z882 Allergy status to sulfonamides status: Secondary | ICD-10-CM | POA: Diagnosis not present

## 2020-02-28 DIAGNOSIS — R0902 Hypoxemia: Secondary | ICD-10-CM | POA: Diagnosis not present

## 2020-02-28 DIAGNOSIS — E782 Mixed hyperlipidemia: Secondary | ICD-10-CM | POA: Diagnosis not present

## 2020-02-28 DIAGNOSIS — F0390 Unspecified dementia without behavioral disturbance: Secondary | ICD-10-CM | POA: Diagnosis present

## 2020-02-28 DIAGNOSIS — I10 Essential (primary) hypertension: Secondary | ICD-10-CM | POA: Diagnosis not present

## 2020-02-28 DIAGNOSIS — I471 Supraventricular tachycardia: Secondary | ICD-10-CM | POA: Diagnosis not present

## 2020-02-28 LAB — CBG MONITORING, ED: Glucose-Capillary: 96 mg/dL (ref 70–99)

## 2020-02-28 LAB — CBC
HCT: 41.6 % (ref 36.0–46.0)
Hemoglobin: 13.5 g/dL (ref 12.0–15.0)
MCH: 30.1 pg (ref 26.0–34.0)
MCHC: 32.5 g/dL (ref 30.0–36.0)
MCV: 92.9 fL (ref 80.0–100.0)
Platelets: 262 10*3/uL (ref 150–400)
RBC: 4.48 MIL/uL (ref 3.87–5.11)
RDW: 13.1 % (ref 11.5–15.5)
WBC: 5.9 10*3/uL (ref 4.0–10.5)
nRBC: 0 % (ref 0.0–0.2)

## 2020-02-28 LAB — COMPREHENSIVE METABOLIC PANEL
ALT: 15 U/L (ref 0–44)
AST: 27 U/L (ref 15–41)
Albumin: 3.5 g/dL (ref 3.5–5.0)
Alkaline Phosphatase: 62 U/L (ref 38–126)
Anion gap: 12 (ref 5–15)
BUN: 18 mg/dL (ref 8–23)
CO2: 25 mmol/L (ref 22–32)
Calcium: 9.4 mg/dL (ref 8.9–10.3)
Chloride: 104 mmol/L (ref 98–111)
Creatinine, Ser: 0.82 mg/dL (ref 0.44–1.00)
GFR calc Af Amer: >60 mL/min (ref >60)
GFR calc non Af Amer: >60 mL/min (ref >60)
Glucose, Bld: 108 mg/dL — ABNORMAL HIGH (ref 70–99)
Potassium: 4.4 mmol/L (ref 3.5–5.1)
Sodium: 141 mmol/L (ref 135–145)
Total Bilirubin: 0.9 mg/dL (ref 0.3–1.2)
Total Protein: 6.4 g/dL — ABNORMAL LOW (ref 6.5–8.1)

## 2020-02-28 MED ORDER — LEVETIRACETAM 500 MG PO TABS: 500.0000 mg | ORAL_TABLET | Freq: Two times a day (BID) | ORAL | Status: DC

## 2020-02-28 MED ORDER — IOHEXOL 350 MG/ML SOLN
75.0000 mL | Freq: Once | INTRAVENOUS | Status: AC | PRN
  Administered 2020-02-28: 75 mL via INTRAVENOUS

## 2020-02-28 MED ORDER — ENOXAPARIN SODIUM 40 MG/0.4ML ~~LOC~~ SOLN
40.0000 mg | SUBCUTANEOUS | Status: DC
  Administered 2020-02-29: 40 mg via SUBCUTANEOUS
  Filled 2020-02-28: qty 0.4

## 2020-02-28 MED ORDER — ASPIRIN 325 MG PO TABS
325.0000 mg | ORAL_TABLET | Freq: Every day | ORAL | Status: DC
  Administered 2020-02-29 – 2020-03-01 (×2): 325 mg via ORAL
  Filled 2020-02-28 (×3): qty 1

## 2020-02-28 MED ORDER — ACETAMINOPHEN 325 MG PO TABS: 650.0000 mg | ORAL_TABLET | ORAL | Status: DC | PRN

## 2020-02-28 MED ORDER — STROKE: EARLY STAGES OF RECOVERY BOOK
Freq: Once | Status: AC
  Filled 2020-02-28: qty 1

## 2020-02-28 MED ORDER — ACETAMINOPHEN 650 MG RE SUPP: 650.0000 mg | RECTAL | Status: DC | PRN

## 2020-02-28 MED ORDER — SENNOSIDES-DOCUSATE SODIUM 8.6-50 MG PO TABS: 1.0000 | ORAL_TABLET | Freq: Every evening | ORAL | Status: DC | PRN

## 2020-02-28 MED ORDER — LEVETIRACETAM IN NACL 1000 MG/100ML IV SOLN
1000.0000 mg | INTRAVENOUS | Status: AC
  Administered 2020-02-28: 1000 mg via INTRAVENOUS
  Filled 2020-02-28: qty 100

## 2020-02-28 MED ORDER — ACETAMINOPHEN 160 MG/5ML PO SOLN: 650.0000 mg | ORAL | Status: DC | PRN

## 2020-02-28 NOTE — ED Provider Notes (Signed)
Had blank stare and right hand tremor lasting 15 min today.  No hx of seizure. Currently awaits EEG, pt given low dose keppra.  Is being admitted.   BP (!) 143/72   Pulse 71   Temp 98.4 F (36.9 C) (Oral)   Resp 12   Ht 4\' 11"  (1.499 m)   Wt 51.7 kg   SpO2 98%   BMI 23.03 kg/m   Results for orders placed or performed during the hospital encounter of 02/28/20  CBC  Result Value Ref Range   WBC 5.9 4.0 - 10.5 K/uL   RBC 4.48 3.87 - 5.11 MIL/uL   Hemoglobin 13.5 12.0 - 15.0 g/dL   HCT 41.6 36.0 - 46.0 %   MCV 92.9 80.0 - 100.0 fL   MCH 30.1 26.0 - 34.0 pg   MCHC 32.5 30.0 - 36.0 g/dL   RDW 13.1 11.5 - 15.5 %   Platelets 262 150 - 400 K/uL   nRBC 0.0 0.0 - 0.2 %  Comprehensive metabolic panel  Result Value Ref Range   Sodium 141 135 - 145 mmol/L   Potassium 4.4 3.5 - 5.1 mmol/L   Chloride 104 98 - 111 mmol/L   CO2 25 22 - 32 mmol/L   Glucose, Bld 108 (H) 70 - 99 mg/dL   BUN 18 8 - 23 mg/dL   Creatinine, Ser 0.82 0.44 - 1.00 mg/dL   Calcium 9.4 8.9 - 10.3 mg/dL   Total Protein 6.4 (L) 6.5 - 8.1 g/dL   Albumin 3.5 3.5 - 5.0 g/dL   AST 27 15 - 41 U/L   ALT 15 0 - 44 U/L   Alkaline Phosphatase 62 38 - 126 U/L   Total Bilirubin 0.9 0.3 - 1.2 mg/dL   GFR calc non Af Amer >60 >60 mL/min   GFR calc Af Amer >60 >60 mL/min   Anion gap 12 5 - 15  CBG monitoring, ED  Result Value Ref Range   Glucose-Capillary 96 70 - 99 mg/dL   Comment 1 Notify RN    Comment 2 Document in Chart    DG Chest 1 View  Result Date: 02/28/2020 CLINICAL DATA:  Altered level of consciousness question infection EXAM: CHEST  1 VIEW COMPARISON:  11/09/2018 FINDINGS: Normal heart size, mediastinal contours, and pulmonary vascularity. Lungs clear. No infiltrate, pleural effusion or pneumothorax. Bowel interposition between liver and diaphragm again seen. IMPRESSION: No acute abnormalities. Electronically Signed   By: Lavonia Dana M.D.   On: 02/28/2020 14:48   CT HEAD WO CONTRAST  Result Date:  02/28/2020 CLINICAL DATA:  Altered mental status, less alert, unable to follow commands EXAM: CT HEAD WITHOUT CONTRAST TECHNIQUE: Contiguous axial images were obtained from the base of the skull through the vertex without intravenous contrast. Sagittal and coronal MPR images reconstructed from axial data set. COMPARISON:  01/04/2020 FINDINGS: Brain: Generalized atrophy. Normal ventricular morphology. No midline shift or mass effect. Small vessel chronic ischemic changes of deep cerebral white matter. Old LEFT basal ganglia lacunar infarcts. Again identified RIGHT occipital infarct. No intracranial hemorrhage, mass lesion, or evidence of acute infarction. No extra-axial fluid collections. Vascular: Atherosclerotic calcification of internal carotid arteries bilaterally at skull base. No hyperdense vessels. Skull: Intact Sinuses/Orbits: Clear Other: N/A IMPRESSION: Atrophy with small vessel chronic ischemic changes of deep cerebral white matter. Old RIGHT occipital and LEFT basal ganglia infarcts. No acute intracranial abnormalities. Electronically Signed   By: Lavonia Dana M.D.   On: 02/28/2020 13:49   MR BRAIN WO CONTRAST  Result Date: 02/28/2020 CLINICAL DATA:  Altered mental status EXAM: MRI HEAD WITHOUT CONTRAST TECHNIQUE: Multiplanar, multiecho pulse sequences of the brain and surrounding structures were obtained without intravenous contrast. COMPARISON:  2019 FINDINGS: Brain: Few small foci mildly reduced diffusion present in the right corona radiata. Confluent areas of T2 hyperintensity in the supratentorial and pontine white matter are nonspecific but probably reflect moderate to advanced chronic microvascular ischemic changes. There is a chronic infarct of the left occipital lobe. Minimal small foci of susceptibility are present, for example within the right cerebellum and right frontal subcortical white matter likely reflecting chronic microhemorrhages. Chronic small vessel infarct along the left  caudothalamic groove. Prominence of the ventricles and sulci reflecting generalized parenchymal volume loss. Most of these findings have progressed since 2019. There is no intracranial mass or significant mass effect. Altered mental status or evidence Vascular: Major vessel flow voids at the skull base are preserved. Skull and upper cervical spine: Normal marrow signal is preserved. Sinuses/Orbits: Paranasal sinuses are aerated. Orbits are unremarkable. Other: Sella is unremarkable.  Mastoid air cells are clear. IMPRESSION: Few small foci of acute infarction in the right corona radiata. Chronic infarcts, chronic microvascular ischemic changes, and parenchymal volume loss with progression since 2019. Electronically Signed   By: Macy Mis M.D.   On: 02/28/2020 16:26      Domenic Moras, PA-C 02/28/20 1643    Quintella Reichert, MD 02/29/20 [phone]

## 2020-02-28 NOTE — ED Notes (Signed)
Patient transported to CT 

## 2020-02-28 NOTE — ED Notes (Signed)
ED provider at bedside.

## 2020-02-28 NOTE — ED Notes (Signed)
Pt transported to MRI 

## 2020-02-28 NOTE — ED Provider Notes (Signed)
[facility] EMERGENCY DEPARTMENT Provider Note   CSN: [id number] Arrival date & time: 02/28/20  1139     History Chief Complaint  Patient presents with  . Altered Mental Status    Sheila Parrish is a 86 y.o. female.  HPI 86 year old female with a history of stroke in January 2021, PVCs, presents to the emergency room for altered mental status.  History provided by patient's daughter who is in the room.  The patient lives at [facility] and has 24-hour care.  Last known normal around 10:30 AM. Caregiver Karlene Einstein noticed sudden onset of weakness while brushing the patient's teeth, lack of facial expression, weakness in the legs. She was able to lower the patient to a chair in the bathroom.  No fall/head trauma reported.  Daughter reports patient was unresponsive to questions.  She has dementia at baseline, but normally can answer questions briefly with yes or no.  Will follow some commands if she feels like it.  The caregiver reported that the patient was not responding to any commands, but was conscious.  Her daughter reports that the patient and her caregiver get along very well and the patient is most responsive to the caregiver.  The caregiver also noted a new right hand tremor during and after the event.  The episode lasted about 15 minutes and the patient appeared to be back to baseline by the time EMS arrived.  Her daughter reports that the patient was able to ambulate without a walker and converse more freely before the stroke in January, since then she has had to use a walker and has had a significant decrease in verbal communication.  She reports her mother is at baseline in the ER.  She is taking a baby aspirin.  Daughter states that there have been no reported fevers, problems with urination, abdominal pain, nausea, vomiting, diarrhea.  Past Medical History:  Diagnosis Date  . High cholesterol   . Scarlet fever 1936   Hospitalized for a month     Patient Active Problem List   Diagnosis Date Noted  . AMS (altered mental status) 02/28/2020  . Frequent PVCs   . Syncope and collapse 11/08/2018  . Near syncope 10/29/2016  . Orthostatic dizziness 10/29/2016  . Ventricular bigeminy 10/29/2016  . Back soft tissue injury 05/30/2013    Past Surgical History:  Procedure Laterality Date  . ABDOMINAL HYSTERECTOMY    . TOTAL KNEE ARTHROPLASTY Left 2010     OB History   No obstetric history on file.     Family History  Problem Relation Age of Onset  . Arthritis Mother        Died at 63  . Stroke Father 90       Died at age 21  . CAD Neg Hx     Social History   Tobacco Use  . Smoking status: Never Smoker  . Smokeless tobacco: Never Used  Substance Use Topics  . Alcohol use: No  . Drug use: No    Home Medications Prior to Admission medications   Medication Sig Start Date End Date Taking? Authorizing Provider  aspirin 81 MG chewable tablet Chew 81 mg by mouth daily.   Yes [provider]  Melatonin Gummies 2.5 MG CHEW Chew 1 tablet by mouth daily as needed (For sleep).   Yes [provider]  escitalopram (LEXAPRO) 5 MG tablet TAKE 1 TABLET BY MOUTH EVERYDAY AT BEDTIME Patient not taking: Reported on 02/28/2020 03/21/19  Ludwig Clarks, DO    Allergies    Donepezil, Alendronate, Atorvastatin, Bactrim [sulfamethoxazole-trimethoprim], Macrodantin [nitrofurantoin macrocrystal], Simvastatin, Sulfa antibiotics, and Azithromycin  Review of Systems   Review of Systems  Constitutional: Negative for fever.  Gastrointestinal: Negative for diarrhea.  Genitourinary: Negative for dysuria.  Skin: Negative.   Neurological: Positive for tremors and weakness. Negative for dizziness and syncope.  Psychiatric/Behavioral: Positive for confusion and decreased concentration.  All other systems reviewed and are negative.   Physical Exam Updated Vital Signs BP (!) 143/72   Pulse 71   Temp 98.4 F (36.9 C)  (Oral)   Resp 12   Ht 4\' 11"  (1.499 m)   Wt 51.7 kg   SpO2 98%   BMI 23.03 kg/m   Physical Exam Vitals reviewed.  Constitutional:      Appearance: Normal appearance. She is normal weight.  HENT:     Head: Normocephalic and atraumatic.  Eyes:     Extraocular Movements: Extraocular movements intact.     Pupils: Pupils are equal, round, and reactive to light.  Cardiovascular:     Rate and Rhythm: Normal rate and regular rhythm.     Pulses: Normal pulses.     Heart sounds: Normal heart sounds.  Pulmonary:     Effort: Pulmonary effort is normal.     Breath sounds: Normal breath sounds.  Abdominal:     General: Abdomen is flat.     Palpations: Abdomen is soft.  Musculoskeletal:     Cervical back: Normal range of motion.  Skin:    General: Skin is warm and dry.  Neurological:     General: No focal deficit present.     Mental Status: She is alert. Mental status is at baseline.   Physical exam difficult to obtain, patient has dementia at baseline.  She will follow some but not all commands.  Daughter reports she is back to baseline.  ED Results / Procedures / Treatments   Labs (all labs ordered are listed, but only abnormal results are displayed) Labs Reviewed  COMPREHENSIVE METABOLIC PANEL - Abnormal; Notable for the following components:      Result Value   Glucose, Bld 108 (*)    Total Protein 6.4 (*)    All other components within normal limits  SARS CORONAVIRUS 2 (TAT 6-24 HRS)  CBC  URINALYSIS, ROUTINE W REFLEX MICROSCOPIC  CBC  CREATININE, SERUM  CBG MONITORING, ED    EKG EKG Interpretation  Date/Time:  Thursday February 28 2020 11:45:30 EDT Ventricular Rate:  80 PR Interval:    QRS Duration: 103 QT Interval:  367 QTC Calculation: 426 R Axis:   43 Text Interpretation: Sinus rhythm Short PR interval Low voltage, precordial leads Borderline T wave abnormalities No acute changes Confirmed by Varney Biles [phone]) on 02/28/2020 12:48:50 PM   Radiology DG  Chest 1 View  Result Date: 02/28/2020 CLINICAL DATA:  Altered level of consciousness question infection EXAM: CHEST  1 VIEW COMPARISON:  11/09/2018 FINDINGS: Normal heart size, mediastinal contours, and pulmonary vascularity. Lungs clear. No infiltrate, pleural effusion or pneumothorax. Bowel interposition between liver and diaphragm again seen. IMPRESSION: No acute abnormalities. Electronically Signed   By: Lavonia Dana M.D.   On: 02/28/2020 14:48   CT HEAD WO CONTRAST  Result Date: 02/28/2020 CLINICAL DATA:  Altered mental status, less alert, unable to follow commands EXAM: CT HEAD WITHOUT CONTRAST TECHNIQUE: Contiguous axial images were obtained from the base of the skull through the vertex without intravenous contrast. Sagittal and coronal  MPR images reconstructed from axial data set. COMPARISON:  01/04/2020 FINDINGS: Brain: Generalized atrophy. Normal ventricular morphology. No midline shift or mass effect. Small vessel chronic ischemic changes of deep cerebral white matter. Old LEFT basal ganglia lacunar infarcts. Again identified RIGHT occipital infarct. No intracranial hemorrhage, mass lesion, or evidence of acute infarction. No extra-axial fluid collections. Vascular: Atherosclerotic calcification of internal carotid arteries bilaterally at skull base. No hyperdense vessels. Skull: Intact Sinuses/Orbits: Clear Other: N/A IMPRESSION: Atrophy with small vessel chronic ischemic changes of deep cerebral white matter. Old RIGHT occipital and LEFT basal ganglia infarcts. No acute intracranial abnormalities. Electronically Signed   By: Lavonia Dana M.D.   On: 02/28/2020 13:49    Procedures Procedures (including critical care time)  Medications Ordered in ED Medications  levETIRAcetam (KEPPRA) IVPB 1000 mg/100 mL premix (has no administration in time range)  levETIRAcetam (KEPPRA) tablet 500 mg (has no administration in time range)   stroke: mapping our early stages of recovery book (has no  administration in time range)  acetaminophen (TYLENOL) tablet 650 mg (has no administration in time range)    Or  acetaminophen (TYLENOL) 160 MG/5ML solution 650 mg (has no administration in time range)    Or  acetaminophen (TYLENOL) suppository 650 mg (has no administration in time range)  enoxaparin (LOVENOX) injection 40 mg (has no administration in time range)  senna-docusate (Senokot-S) tablet 1 tablet (has no administration in time range)    ED Course  I have reviewed the triage vital signs and the nursing notes.  Pertinent labs & imaging results that were available during my care of the patient were reviewed by me and considered in my medical decision making (see chart for details).  Clinical Course as of Feb 27 1610  Thu Feb 28, 2020  1347 Dr. Kathrynn Humble spoke with the patient's caregiver who noted a right sided hand tremor during the episode.  Concern for possible seizure.  Will consult neurology.   [MB]  [id number] Neurology wants to admit for further work-up/observation/management.  Consult hospitalist.  Patient will have EEG ordered and started on low-dose Keppra per neurology.   [MB]    Clinical Course User Index [MB] Lyndel Safe   MDM Rules/Calculators/A&P                      86 year old female with a history of stroke in January 2021 presents to the ER today with altered mental status per caregiver report. --DDX: seizure, TIA, infection, brain bleed, infection  --Mildly hypertensive in ED, other vitals unremarkable. --CT head shows no new intercranial abnormalities.  MRI pending.  Chest x-ray normal.  EKG normal.  Urinalysis pending --CBC unremarkable, glucose mildly elevated. --Consulted neurology.  After their evaluation, deemed that patient should be admitted for further evaluation/management.  We will get EEG and start on low-dose Keppra per the recommendation.  Patient remains at baseline in ED.  Final Clinical Impression(s) / ED Diagnoses Final diagnoses:   Seizure-like activity [facility])  Unresponsive    Rx / DC Orders ED Discharge Orders    None       Garald Balding, PA-C 02/28/20 [place], Ankit, MD 03/03/20 [phone]

## 2020-02-28 NOTE — H&P (Addendum)
History and Physical    Sheila Parrish [id number] DOB: Aug 29, 1927 DOA: 02/28/2020  PCP: Lavone Orn, MD  [facility]  I have personally briefly reviewed patient's old medical records in [facility]  Chief Complaint: Confusion/altered mental status  HPI: Sheila Parrish is a 86 y.o. female with medical history significant of CVA in January 2021, dementia, chronic diastolic congestive heart failure, PVCs, hypertension, hyperlipidemia presents to emergency department with altered mental status.    Patient's daughter [name] is at bedside who is the historian.  She reports that patient lives in assisted living facility requiring 24-hour help.  This morning while patient was brushing her teeth she became unresponsive with a glazed look on her face, became weak and started shaking.  The episode lasted for 30 minutes.  Patient did not pass out at that time.  EMS was called and brought patient to the emergency department for further evaluation and management.  Patient had stroke in January 2021 however patient was not hospitalized at that time.  She was started on aspirin 81 mg by her PCP.  Her CT head scan at that time revealed right occipital lobe subacute stroke.  She only takes aspirin 81 mg once daily.  Her metoprolol was discontinued on previous admission due to syncope.  No history of headache, chest pain, shortness of breath, palpitation, leg swelling, fever, chills, nausea, vomiting, diarrhea, urinary symptoms.  She uses wheelchair for ambulation at baseline. Upon my evaluation: Patient alert and following commands however not oriented to time place and person.   ED Course: Upon arrival: Patient's vital signs stable.  CBC, CMP came back within normal limits.  Chest x-ray and CT head without contrast came back negative for acute findings.  EDP consulted neurology who recommended MRI.  Prior hospitalist consulted for admission.  Review  of Systems: As per HPI otherwise negative.    Past Medical History:  Diagnosis Date  . High cholesterol   . Scarlet fever 1936   Hospitalized for a month    Past Surgical History:  Procedure Laterality Date  . ABDOMINAL HYSTERECTOMY    . TOTAL KNEE ARTHROPLASTY Left 2010     reports that she has never smoked. She has never used smokeless tobacco. She reports that she does not drink alcohol or use drugs.  Allergies  Allergen Reactions  . Donepezil Nausea And Vomiting  . Alendronate Other (See Comments)    Difficulty swallowing  . Atorvastatin Other (See Comments)    Leg cramping  . Bactrim [Sulfamethoxazole-Trimethoprim] Nausea And Vomiting  . Macrodantin [Nitrofurantoin Macrocrystal] Other (See Comments)    Unknown reaction  . Simvastatin Other (See Comments)    Leg cramping  . Sulfa Antibiotics Nausea And Vomiting  . Azithromycin Rash    Family History  Problem Relation Age of Onset  . Arthritis Mother        Died at 14  . Stroke Father 60       Died at age 14  . CAD Neg Hx     Prior to Admission medications   Medication Sig Start Date End Date Taking? Authorizing Provider  Calcium Citrate-Vitamin D (CITRACAL + D PO) Take 1 tablet by mouth daily with breakfast.     [provider]  escitalopram (LEXAPRO) 5 MG tablet TAKE 1 TABLET BY MOUTH EVERYDAY AT BEDTIME 03/21/19   Tat, Eustace Quail, DO  Multiple Vitamin (MULTIVITAMIN WITH MINERALS) TABS Take 1 tablet by mouth daily with breakfast.  [provider]    Physical Exam: Vitals:   02/28/20 1300 02/28/20 1315 02/28/20 1430 02/28/20 1500  BP: (!) 118/41 (!) 130/59 (!) 123/59 (!) 143/72  Pulse: 67 70 70 71  Resp: 12 16 (!) 28 12  Temp:      TempSrc:      SpO2: 98% 96% 97% 98%  Weight:      Height:        Constitutional: NAD, calm, comfortable, on room air Eyes: PERRL, lids and conjunctivae normal ENMT: Mucous membranes are moist. Posterior pharynx clear of any exudate or lesions.Normal  dentition.  Neck: normal, supple, no masses, no thyromegaly Respiratory: clear to auscultation bilaterally, no wheezing, no crackles. Normal respiratory effort. No accessory muscle use.  Cardiovascular: Regular rate and rhythm, no murmurs / rubs / gallops. No extremity edema. 2+ pedal pulses. No carotid bruits.  Abdomen: no tenderness, no masses palpated. No hepatosplenomegaly. Bowel sounds positive.  Musculoskeletal: no clubbing / cyanosis. No joint deformity upper and lower extremities. Good ROM, no contractures. Normal muscle tone.  Skin: no rashes, lesions, ulcers. No induration Neurologic: CN 2-12 grossly intact. Sensation intact, DTR normal. Strength 5/5 in all 4.  Psychiatric: Normal judgment and insight.  Alert and following commands.  Not oriented to time place or person.   Labs on Admission: I have personally reviewed following labs and imaging studies  CBC: Recent Labs  Lab 02/28/20 1150  WBC 5.9  HGB 13.5  HCT 41.6  MCV 92.9  PLT 99991111   Basic Metabolic Panel: Recent Labs  Lab 02/28/20 1150  NA 141  K 4.4  CL 104  CO2 25  GLUCOSE 108*  BUN 18  CREATININE 0.82  CALCIUM 9.4   GFR: Estimated Creatinine Clearance: 30.5 mL/min (by C-G formula based on SCr of 0.82 mg/dL). Liver Function Tests: Recent Labs  Lab 02/28/20 1150  AST 27  ALT 15  ALKPHOS 62  BILITOT 0.9  PROT 6.4*  ALBUMIN 3.5   No results for input(s): LIPASE, AMYLASE in the last 168 hours. No results for input(s): AMMONIA in the last 168 hours. Coagulation Profile: No results for input(s): INR, PROTIME in the last 168 hours. Cardiac Enzymes: No results for input(s): CKTOTAL, CKMB, CKMBINDEX, TROPONINI in the last 168 hours. BNP (last 3 results) No results for input(s): PROBNP in the last 8760 hours. HbA1C: No results for input(s): HGBA1C in the last 72 hours. CBG: Recent Labs  Lab 02/28/20 1144  GLUCAP 96   Lipid Profile: No results for input(s): CHOL, HDL, LDLCALC, TRIG, CHOLHDL,  LDLDIRECT in the last 72 hours. Thyroid Function Tests: No results for input(s): TSH, T4TOTAL, FREET4, T3FREE, THYROIDAB in the last 72 hours. Anemia Panel: No results for input(s): VITAMINB12, FOLATE, FERRITIN, TIBC, IRON, RETICCTPCT in the last 72 hours. Urine analysis:    Component Value Date/Time   COLORURINE YELLOW 11/09/2018 0240   APPEARANCEUR HAZY (A) 11/09/2018 0240   LABSPEC 1.008 11/09/2018 0240   PHURINE 7.0 11/09/2018 0240   GLUCOSEU NEGATIVE 11/09/2018 0240   HGBUR NEGATIVE 11/09/2018 0240   BILIRUBINUR NEGATIVE 11/09/2018 0240   KETONESUR NEGATIVE 11/09/2018 0240   PROTEINUR NEGATIVE 11/09/2018 0240   UROBILINOGEN 1.0 04/07/2009 1413   NITRITE POSITIVE (A) 11/09/2018 0240   LEUKOCYTESUR LARGE (A) 11/09/2018 0240    Radiological Exams on Admission: DG Chest 1 View  Result Date: 02/28/2020 CLINICAL DATA:  Altered level of consciousness question infection EXAM: CHEST  1 VIEW COMPARISON:  11/09/2018 FINDINGS: Normal heart size, mediastinal contours, and pulmonary  vascularity. Lungs clear. No infiltrate, pleural effusion or pneumothorax. Bowel interposition between liver and diaphragm again seen. IMPRESSION: No acute abnormalities. Electronically Signed   By: Lavonia Dana M.D.   On: 02/28/2020 14:48   CT HEAD WO CONTRAST  Result Date: 02/28/2020 CLINICAL DATA:  Altered mental status, less alert, unable to follow commands EXAM: CT HEAD WITHOUT CONTRAST TECHNIQUE: Contiguous axial images were obtained from the base of the skull through the vertex without intravenous contrast. Sagittal and coronal MPR images reconstructed from axial data set. COMPARISON:  01/04/2020 FINDINGS: Brain: Generalized atrophy. Normal ventricular morphology. No midline shift or mass effect. Small vessel chronic ischemic changes of deep cerebral white matter. Old LEFT basal ganglia lacunar infarcts. Again identified RIGHT occipital infarct. No intracranial hemorrhage, mass lesion, or evidence of acute  infarction. No extra-axial fluid collections. Vascular: Atherosclerotic calcification of internal carotid arteries bilaterally at skull base. No hyperdense vessels. Skull: Intact Sinuses/Orbits: Clear Other: N/A IMPRESSION: Atrophy with small vessel chronic ischemic changes of deep cerebral white matter. Old RIGHT occipital and LEFT basal ganglia infarcts. No acute intracranial abnormalities. Electronically Signed   By: Lavonia Dana M.D.   On: 02/28/2020 13:49   MR BRAIN WO CONTRAST  Result Date: 02/28/2020 CLINICAL DATA:  Altered mental status EXAM: MRI HEAD WITHOUT CONTRAST TECHNIQUE: Multiplanar, multiecho pulse sequences of the brain and surrounding structures were obtained without intravenous contrast. COMPARISON:  2019 FINDINGS: Brain: Few small foci mildly reduced diffusion present in the right corona radiata. Confluent areas of T2 hyperintensity in the supratentorial and pontine white matter are nonspecific but probably reflect moderate to advanced chronic microvascular ischemic changes. There is a chronic infarct of the left occipital lobe. Minimal small foci of susceptibility are present, for example within the right cerebellum and right frontal subcortical white matter likely reflecting chronic microhemorrhages. Chronic small vessel infarct along the left caudothalamic groove. Prominence of the ventricles and sulci reflecting generalized parenchymal volume loss. Most of these findings have progressed since 2019. There is no intracranial mass or significant mass effect. Altered mental status or evidence Vascular: Major vessel flow voids at the skull base are preserved. Skull and upper cervical spine: Normal marrow signal is preserved. Sinuses/Orbits: Paranasal sinuses are aerated. Orbits are unremarkable. Other: Sella is unremarkable.  Mastoid air cells are clear. IMPRESSION: Few small foci of acute infarction in the right corona radiata. Chronic infarcts, chronic microvascular ischemic changes, and  parenchymal volume loss with progression since 2019. Electronically Signed   By: Macy Mis M.D.   On: 02/28/2020 16:26    EKG: Sinus rhythm, short PR interval, nonspecific T wave changes.  No ST elevation or depression noted.  Assessment/Plan Principal Problem:   AMS (altered mental status) Active Problems:   CVA (cerebral vascular accident) ([place])   Acute infarction in the right corona radiata: -Admit patient on the floor under observation.  On telemetry. -MRI brain as above. -Allow for permissive hypertension for the first 24-48h - only treat PRN if SBP 123456 mmHg or diastolic blood pressure 123XX123. Blood pressures can be gradually normalized to SBP<140 upon discharge. -EDP consulted neurology-appreciate help -Ordered CTA of head and neck, transthoracic echo, lipid panel, A1c -Continue Keppra as per neurology -Start on aspirin.  Patient is allergic to atorvastatin. -Neurochecks every 4 hour, -We will keep her n.p.o. until she passes a bedside swallow evaluation. -Consult PT/OT/ST -Seizure/fall/aspiration precautions  Hypertension: -Blood pressure is currently stable.  She is not on antihypertensive medication at home. -Continue to monitor  History of PVCs: -Not  on beta-blocker at home -On telemetry.  Continue to monitor.  DVT prophylaxis: Lovenox, SCD  code Status: NO CPR/Shock/Intubation, okay with IVF & CPAP/BIPAP Family Communication: Patient's daughter present at bedside.  Plan of care discussed with patient daughter in length and she verbalized understanding and agreed with it. Disposition Plan: To be determined Consults called: Neurology by EDP Admission status: Observation   Mckinley Jewel MD [facility] Pager [phone]  If 7PM-7AM, please contact night-coverage [email or web address] Password [facility]  02/28/2020, 4:34 PM

## 2020-02-28 NOTE — ED Triage Notes (Addendum)
Pt to ED via EMS from [facility] facility. Staff noted aprox 1 hour ago, pt orientation was more altered than normal and not acting her self, unable to follow commands. Pt normally alert and responds to verbal stimuli answering yes and no questions. Altered state lasted about 30 mins, orientation while with EMS approved back to baseline.  Pt currently in speech therapy.  #20 LAC. No medications given by EMS. Hx dementia, dysphagia, stroke. Last VS HR 82, 94%RA, BP 150/72, CBG 127

## 2020-02-28 NOTE — ED Notes (Signed)
Limited assessment d/t pt only answers questions yes and/or no at times, and other times nods/gestures appropriately. When asked her name, pt said "Sheila Parrish"

## 2020-02-28 NOTE — Progress Notes (Signed)
MRI  Completed  And reviewed:  IMPRESSION: Few small foci of acute infarction in the right corona radiata. Chronic infarcts, chronic microvascular ischemic changes, and parenchymal volume loss with progression since 2019.    Updated recs in addition to before in the consult from today: -Telemetry monitoring -Allow for permissive hypertension for the first 24-48h - only treat PRN if SBP >220 mmHg. Blood pressures can be gradually normalized to SBP<140 upon discharge. -CT Angiogram of Head and neck -Echocardiogram -HgbA1c, fasting lipid panel -Frequent neuro checks -Prophylactic therapy-Antiplatelet med: Aspirin - dose 325mg  PO or 300mg  PR -Atorvastatin 80 mg PO daily -PT consult, OT consult, Speech consult -If Afib found on telemetry, will need anticoagulation. Decision pending imaging and stroke team rounding.  Please page stroke NP/PA/MD (listed on AMION)  from 8am-4 pm as this patient will be followed by the stroke team at this point.  -- Amie Portland, MD [facility] Pager: [phone] If 7pm to 7am, please call on call as listed on AMION.

## 2020-02-28 NOTE — ED Notes (Signed)
Daughter at bedside.

## 2020-02-28 NOTE — Consult Note (Signed)
Neurology Consultation  Reason for Consult: Altered mental status, shaking Referring Physician: Dr. Kathrynn Humble, EDP  CC: Altered mental status, shaking, weakness  History is obtained from: Chart review, patient's daughter at bedside  HPI: Sheila Parrish is a 86 y.o. female past medical history of moderate advanced dementia residing at a assisted living facility requiring 24-hour ADL help, recent stroke in January 2021 not hospitalized-noted by primary care on imaging, hypertension, hyperlipidemia presented to the emergency room for an episode this morning after the facility where she became less responsive with a glazed look on her face, did not pass out, became weak all over and then had some right-sided shaking. No history of seizures in the past. Dementia has been diagnosed 2 to 3 years ago and has been progressively getting worse to the point where 3 years ago she was in independent living and now she requires 24 hours help with ADLs. Currently being followed by Dr. Delice Lesch as an outpatient. No recent illnesses or sicknesses reported by the family. She has had some episodes of "passing out" as far back as the Thanksgiving of 2019 but never associated with shaking or glazed look. Today's episode lasted about 30 minutes prior to return to baseline. At baseline, she is able to say a few words, is able to respond to questions some but is very reluctant to initiate conversation per the daughter.  She had a CT scan done in January this year because of the primary care's concern for stroke that revealed a right occipital subacute stroke.  She was not hospitalized and was started on an aspirin.  Last known well was sometime earlier this morning-unclear exact time. tPA given-neuro-nonfocal exam. MRS:4   ROS: Review of systems performed and is negative except as noted in HPI.  Past Medical History:  Diagnosis Date  . High cholesterol   . Scarlet fever 1936   Hospitalized for a month   Hypertension Hyperlipidemia  Family History  Problem Relation Age of Onset  . Arthritis Mother        Died at 67  . Stroke Father 11       Died at age 71  . CAD Neg Hx    Social History:   reports that she has never smoked. She has never used smokeless tobacco. She reports that she does not drink alcohol or use drugs.  Medications No current facility-administered medications for this encounter.  Current Outpatient Medications:  .  Calcium Citrate-Vitamin D (CITRACAL + D PO), Take 1 tablet by mouth daily with breakfast. , Disp: , Rfl:  .  escitalopram (LEXAPRO) 5 MG tablet, TAKE 1 TABLET BY MOUTH EVERYDAY AT BEDTIME, Disp: 30 tablet, Rfl: 5 .  Multiple Vitamin (MULTIVITAMIN WITH MINERALS) TABS, Take 1 tablet by mouth daily with breakfast. , Disp: , Rfl:   Exam: Current vital signs: BP (!) 123/59   Pulse 70   Temp 98.4 F (36.9 C) (Oral)   Resp (!) 28   Ht 4\' 11"  (1.499 m)   Wt 51.7 kg   SpO2 97%   BMI 23.03 kg/m  Vital signs in last 24 hours: Temp:  [98.4 F (36.9 C)] 98.4 F (36.9 C) (03/18 1147) Pulse Rate:  [64-77] 70 (03/18 1430) Resp:  [10-28] 28 (03/18 1430) BP: (115-130)/(41-59) 123/59 (03/18 1430) SpO2:  [95 %-99 %] 97 % (03/18 1430) Weight:  [51.7 kg] 51.7 kg (03/18 1159) General: Awake alert in no distress HEENT: Normocephalic atraumatic, dry mucous membranes Cardiovascular: Regular Lungs: Clear Abdomen: Soft nondistended nontender  Extremities warm well perfused with trace edema Neurological exam Mental status, awake alert oriented to self.  She is very slow to respond to questions.  Was able to say her name but was unable to tell me her age or date of birth on repetitive questioning. She was able to name a few simple objects such as pen and hand but could not name uncommon objects.  Against slowed respond to questions. Her speech is not dysarthric. Cranial nerves: Pupils equal round react light, extraocular movements appear intact, difficult assess  visual fields due to her participation but she blinks to threat from both sides, face appears symmetric, tongue and palate appear midline. Motor exam: She is antigravity in all 4 extremities. Sensory exam: Intact light touch Coordination: No obvious dysmetria Gait testing deferred at this time NIH stroke scale- 1a Level of Conscious.: 0 1b LOC Questions: 2 1c LOC Commands: 0 2 Best Gaze: 0 3 Visual: 0 4 Facial Palsy: 0 5a Motor Arm - left: 0 5b Motor Arm - Right: 0 6a Motor Leg - Left: 0 6b Motor Leg - Right: 0 7 Limb Ataxia: 0 8 Sensory: 0 9 Best Language: 1 10 Dysarthria: 0 11 Extinct. and Inatten.: 0 TOTAL: 3   Labs I have reviewed labs in epic and the results pertinent to this consultation are:  CBC    Component Value Date/Time   WBC 5.9 02/28/2020 1150   RBC 4.48 02/28/2020 1150   HGB 13.5 02/28/2020 1150   HCT 41.6 02/28/2020 1150   PLT 262 02/28/2020 1150   MCV 92.9 02/28/2020 1150   MCH 30.1 02/28/2020 1150   MCHC 32.5 02/28/2020 1150   RDW 13.1 02/28/2020 1150   LYMPHSABS 1.3 10/29/2016 0915   MONOABS 0.2 10/29/2016 0915   EOSABS 0.0 10/29/2016 0915   BASOSABS 0.0 10/29/2016 0915    CMP     Component Value Date/Time   NA 141 02/28/2020 1150   K 4.4 02/28/2020 1150   CL 104 02/28/2020 1150   CO2 25 02/28/2020 1150   GLUCOSE 108 (H) 02/28/2020 1150   BUN 18 02/28/2020 1150   CREATININE 0.82 02/28/2020 1150   CALCIUM 9.4 02/28/2020 1150   PROT 6.4 (L) 02/28/2020 1150   ALBUMIN 3.5 02/28/2020 1150   AST 27 02/28/2020 1150   ALT 15 02/28/2020 1150   ALKPHOS 62 02/28/2020 1150   BILITOT 0.9 02/28/2020 1150   GFRNONAA >60 02/28/2020 1150   GFRAA >60 02/28/2020 1150    Imaging I have reviewed the images obtained:  CT-scan of the brain-expected evolution of the right PCA stroke seen in January on the CT head.  Also seen old left basal ganglia infarcts.  Assessment: 86 year old with past medical history of hypertension, hyperlipidemia moderate  advanced dementia residing in an assisted living facility requiring 24-hour help with ADLs, recent stroke in January 2021 with no work-up done at that time started on aspirin, coming in for an episode of generalized weakness, glazed look and possible right-sided shaking that lasted about 30 minutes and after that she came back to her baseline. I suspect that she could have had a seizure or might have had some embolic strokes.  Other diagnoses to consider would be cardiogenic syncope.  Impression: Evaluate for seizure Evaluate for stroke History of dementia, hypertension, hyperlipidemia  Recommendations: -MRI brain without contrast -Cardiac monitoring -EEG -Maintain seizure precautions -Start Keppra-load with Keppra 1 g IV now and start Keppra 500 twice daily. -If the MRI brain shows a stroke, at that time,  she will need a full stroke work-up including CTA head and neck, echocardiogram, lipid panel and A1c.  -Plan relayed to the ED provider in the ED pod.  -- Amie Portland, MD [facility] Pager: [phone] If 7pm to 7am, please call on call as listed on [facility].

## 2020-02-29 ENCOUNTER — Observation Stay (HOSPITAL_BASED_OUTPATIENT_CLINIC_OR_DEPARTMENT_OTHER): Payer: Medicare PPO

## 2020-02-29 DIAGNOSIS — R4182 Altered mental status, unspecified: Secondary | ICD-10-CM | POA: Diagnosis present

## 2020-02-29 DIAGNOSIS — I471 Supraventricular tachycardia: Secondary | ICD-10-CM

## 2020-02-29 DIAGNOSIS — E785 Hyperlipidemia, unspecified: Secondary | ICD-10-CM | POA: Diagnosis present

## 2020-02-29 DIAGNOSIS — F329 Major depressive disorder, single episode, unspecified: Secondary | ICD-10-CM | POA: Diagnosis present

## 2020-02-29 DIAGNOSIS — Z8261 Family history of arthritis: Secondary | ICD-10-CM | POA: Diagnosis not present

## 2020-02-29 DIAGNOSIS — I6310 Cerebral infarction due to embolism of unspecified precerebral artery: Secondary | ICD-10-CM

## 2020-02-29 DIAGNOSIS — Z8673 Personal history of transient ischemic attack (TIA), and cerebral infarction without residual deficits: Secondary | ICD-10-CM | POA: Diagnosis not present

## 2020-02-29 DIAGNOSIS — Z96652 Presence of left artificial knee joint: Secondary | ICD-10-CM | POA: Diagnosis present

## 2020-02-29 DIAGNOSIS — I959 Hypotension, unspecified: Secondary | ICD-10-CM | POA: Diagnosis not present

## 2020-02-29 DIAGNOSIS — I639 Cerebral infarction, unspecified: Secondary | ICD-10-CM | POA: Diagnosis not present

## 2020-02-29 DIAGNOSIS — R29708 NIHSS score 8: Secondary | ICD-10-CM | POA: Diagnosis present

## 2020-02-29 DIAGNOSIS — R569 Unspecified convulsions: Secondary | ICD-10-CM | POA: Diagnosis not present

## 2020-02-29 DIAGNOSIS — Z882 Allergy status to sulfonamides status: Secondary | ICD-10-CM | POA: Diagnosis not present

## 2020-02-29 DIAGNOSIS — G9341 Metabolic encephalopathy: Secondary | ICD-10-CM | POA: Diagnosis present

## 2020-02-29 DIAGNOSIS — I6340 Cerebral infarction due to embolism of unspecified cerebral artery: Secondary | ICD-10-CM | POA: Diagnosis present

## 2020-02-29 DIAGNOSIS — Z888 Allergy status to other drugs, medicaments and biological substances status: Secondary | ICD-10-CM | POA: Diagnosis not present

## 2020-02-29 DIAGNOSIS — Z823 Family history of stroke: Secondary | ICD-10-CM | POA: Diagnosis not present

## 2020-02-29 DIAGNOSIS — Z20822 Contact with and (suspected) exposure to covid-19: Secondary | ICD-10-CM | POA: Diagnosis present

## 2020-02-29 DIAGNOSIS — F0390 Unspecified dementia without behavioral disturbance: Secondary | ICD-10-CM | POA: Diagnosis present

## 2020-02-29 DIAGNOSIS — Z7982 Long term (current) use of aspirin: Secondary | ICD-10-CM | POA: Diagnosis not present

## 2020-02-29 DIAGNOSIS — I6389 Other cerebral infarction: Secondary | ICD-10-CM

## 2020-02-29 DIAGNOSIS — Z881 Allergy status to other antibiotic agents status: Secondary | ICD-10-CM | POA: Diagnosis not present

## 2020-02-29 DIAGNOSIS — I493 Ventricular premature depolarization: Secondary | ICD-10-CM | POA: Diagnosis present

## 2020-02-29 DIAGNOSIS — I110 Hypertensive heart disease with heart failure: Secondary | ICD-10-CM | POA: Diagnosis present

## 2020-02-29 DIAGNOSIS — I5032 Chronic diastolic (congestive) heart failure: Secondary | ICD-10-CM | POA: Diagnosis present

## 2020-02-29 DIAGNOSIS — I10 Essential (primary) hypertension: Secondary | ICD-10-CM | POA: Diagnosis not present

## 2020-02-29 DIAGNOSIS — E7800 Pure hypercholesterolemia, unspecified: Secondary | ICD-10-CM | POA: Diagnosis present

## 2020-02-29 DIAGNOSIS — E782 Mixed hyperlipidemia: Secondary | ICD-10-CM | POA: Diagnosis not present

## 2020-02-29 LAB — BASIC METABOLIC PANEL
Anion gap: 10 (ref 5–15)
BUN: 13 mg/dL (ref 8–23)
CO2: 25 mmol/L (ref 22–32)
Calcium: 8.9 mg/dL (ref 8.9–10.3)
Chloride: 105 mmol/L (ref 98–111)
Creatinine, Ser: 0.77 mg/dL (ref 0.44–1.00)
GFR calc Af Amer: >60 mL/min (ref >60)
GFR calc non Af Amer: >60 mL/min (ref >60)
Glucose, Bld: 100 mg/dL — ABNORMAL HIGH (ref 70–99)
Potassium: 3.3 mmol/L — ABNORMAL LOW (ref 3.5–5.1)
Sodium: 140 mmol/L (ref 135–145)

## 2020-02-29 LAB — URINALYSIS, ROUTINE W REFLEX MICROSCOPIC
Bacteria, UA: NONE SEEN
Bilirubin Urine: NEGATIVE
Glucose, UA: NEGATIVE mg/dL
Hgb urine dipstick: NEGATIVE
Ketones, ur: 20 mg/dL — AB
Leukocytes,Ua: NEGATIVE
Nitrite: POSITIVE — AB
Protein, ur: NEGATIVE mg/dL
Specific Gravity, Urine: 1.029 (ref 1.005–1.030)
pH: 7 (ref 5.0–8.0)

## 2020-02-29 LAB — CBC WITH DIFFERENTIAL/PLATELET
Abs Immature Granulocytes: 0.02 10*3/uL (ref 0.00–0.07)
Basophils Absolute: 0.1 10*3/uL (ref 0.0–0.1)
Basophils Relative: 1 %
Eosinophils Absolute: 0.2 10*3/uL (ref 0.0–0.5)
Eosinophils Relative: 3 %
HCT: 35 % — ABNORMAL LOW (ref 36.0–46.0)
Hemoglobin: 11.6 g/dL — ABNORMAL LOW (ref 12.0–15.0)
Immature Granulocytes: 0 %
Lymphocytes Relative: 23 %
Lymphs Abs: 1.4 10*3/uL (ref 0.7–4.0)
MCH: 30 pg (ref 26.0–34.0)
MCHC: 33.1 g/dL (ref 30.0–36.0)
MCV: 90.4 fL (ref 80.0–100.0)
Monocytes Absolute: 0.5 10*3/uL (ref 0.1–1.0)
Monocytes Relative: 9 %
Neutro Abs: 3.8 10*3/uL (ref 1.7–7.7)
Neutrophils Relative %: 64 %
Platelets: 221 10*3/uL (ref 150–400)
RBC: 3.87 MIL/uL (ref 3.87–5.11)
RDW: 13.1 % (ref 11.5–15.5)
WBC: 6 10*3/uL (ref 4.0–10.5)
nRBC: 0 % (ref 0.0–0.2)

## 2020-02-29 LAB — HEMOGLOBIN A1C
Hgb A1c MFr Bld: 5.8 % — ABNORMAL HIGH (ref 4.8–5.6)
Mean Plasma Glucose: 119.76 mg/dL

## 2020-02-29 LAB — GLUCOSE, CAPILLARY
Glucose-Capillary: 86 mg/dL (ref 70–99)
Glucose-Capillary: 134 mg/dL — ABNORMAL HIGH (ref 70–99)

## 2020-02-29 LAB — LIPID PANEL
Cholesterol: 214 mg/dL — ABNORMAL HIGH (ref 0–200)
HDL: 32 mg/dL — ABNORMAL LOW (ref >40)
LDL Cholesterol: 167 mg/dL — ABNORMAL HIGH (ref 0–99)
Total CHOL/HDL Ratio: 6.7 RATIO
Triglycerides: 77 mg/dL (ref <150)
VLDL: 15 mg/dL (ref 0–40)

## 2020-02-29 LAB — ECHOCARDIOGRAM COMPLETE
Height: 59 in
Weight: 1824 oz

## 2020-02-29 LAB — SARS CORONAVIRUS 2 (TAT 6-24 HRS): SARS Coronavirus 2: NEGATIVE

## 2020-02-29 MED ORDER — SODIUM CHLORIDE 0.9 % IV SOLN
INTRAVENOUS | Status: DC | PRN
  Administered 2020-02-29: 250 mL via INTRAVENOUS

## 2020-02-29 MED ORDER — LEVETIRACETAM IN NACL 500 MG/100ML IV SOLN
500.0000 mg | Freq: Two times a day (BID) | INTRAVENOUS | Status: DC
  Administered 2020-02-29 (×2): 500 mg via INTRAVENOUS
  Filled 2020-02-29 (×2): qty 100

## 2020-02-29 MED ORDER — POTASSIUM CHLORIDE 10 MEQ/100ML IV SOLN
10.0000 meq | INTRAVENOUS | Status: AC
  Administered 2020-02-29 (×3): 10 meq via INTRAVENOUS
  Filled 2020-02-29 (×3): qty 100

## 2020-02-29 MED ORDER — DEXTROSE-NACL 5-0.45 % IV SOLN: INTRAVENOUS | Status: DC

## 2020-02-29 MED ORDER — METOPROLOL TARTRATE 25 MG PO TABS: 12.5000 mg | ORAL_TABLET | Freq: Two times a day (BID) | ORAL | 0 refills | Status: DC

## 2020-02-29 MED ORDER — APIXABAN 2.5 MG PO TABS: 2.5000 mg | ORAL_TABLET | Freq: Two times a day (BID) | ORAL | 0 refills | Status: DC

## 2020-02-29 NOTE — TOC Transition Note (Signed)
Transition of Care [facility]) - CM/SW Discharge Note   Patient Details  Name: Sheila Parrish MRN: [id number] Date of Birth: 1926-12-31  Transition of Care [facility]) CM/SW Contact:  Blima Ledger Phone Number: [phone] 02/29/2020, 4:27 PM   Clinical Narrative:     Pt is discharging home with [facility] for Home health. CSW provided family with Eliquis card. Pt has no other needs.    Final next level of care: [place] Barriers to Discharge: Barriers Resolved   Patient Goals and CMS Choice Patient states their goals for this hospitalization and ongoing recovery are:: unable to participate un goal setting      Discharge Placement                Patient to be transferred to facility by: Daughter Sheila Parrish Name of family member notified: Sheila Parrish Patient and family notified of of transfer: 02/29/20  Discharge Plan and Services                          HH Arranged: RN, PT, OT [facility] Agency: Other - See comment([facility]) Date HH Agency Contacted: 02/29/20 Time [place]: 1600 Representative spoke with at [place]: [place] ([place]) Interventions     Readmission Risk Interventions No flowsheet data found.  Emeterio Reeve, Latanya Presser, [place] Licensed [facility] representative

## 2020-02-29 NOTE — Discharge Summary (Addendum)
Physician Discharge Summary  Sheila Parrish [id number] DOB: 08-25-1927 DOA: 02/28/2020  PCP: Lavone Orn, MD  Admit date: 02/28/2020 Discharge date: 02/29/2020  Recommendations for Outpatient Follow-up:  Discharge to home with home health RN, PT. Follow up with PCP in 7-10 days. Follow up with Dr. Tilden Dome in stroke clinic in 6-8 weeks.  Discharge Diagnoses: Principal diagnosis is #1 1. Acute infarct to the right corona radiata possibly of embolic etiology 2. Concern for atrial fibrillation 3. Hypertension 4. Hyperlipidemia 5. Metabolic encephalopathy per EEG  Discharge Condition: Fair  Disposition: Home with home health PT/RN  Diet recommendation: Heart healthy  Filed Weights   02/28/20 1159  Weight: 51.7 kg   History of present illness:   Sheila Parrish is a 86 y.o. female with medical history significant of CVA in January 2021, dementia, chronic diastolic congestive heart failure, PVCs, hypertension, hyperlipidemia presents to emergency department with altered mental status.    Patient's daughter Sheila Parrish is at bedside who is the historian.  She reports that patient lives in assisted living facility requiring 24-hour help.  This morning while patient was brushing her teeth she became unresponsive with a glazed look on her face, became weak and started shaking.  The episode lasted for 30 minutes.  Patient did not pass out at that time.  EMS was called and brought patient to the emergency department for further evaluation and management.  Patient had stroke in January 2021 however patient was not hospitalized at that time.  She was started on aspirin 81 mg by her PCP.  Her CT head scan at that time revealed right occipital lobe subacute stroke.  She only takes aspirin 81 mg once daily.  Her metoprolol was discontinued on previous admission due to syncope.  No history of headache, chest pain, shortness of breath, palpitation, leg swelling, fever, chills, nausea, vomiting,  diarrhea, urinary symptoms.  She uses wheelchair for ambulation at baseline.  [facility] were consulted to admit the patient for further evaluation and treatment.  Hospital Course:  The patient was admitted to a telemetry bed. Neurology was consulted. She was initially placed on IV Keppra for seizure prophylaxis. The patient was given an aspirin. She was given a stroke work up. MRI confirmed a few small foci of acute infarction in the right corona radiata. Echocardiogram was obtained that demonstrated EF of 65-70% with no regional wall motion abnormalities. There was a grade II diastolic dysfunction. There was normal LV systolic function with a mildly elevated pulmonary artery systolic pressure at Q000111Q mmHg. No intracardiac source of embolism was detected. CTA head and neck demonstrated no emergent large vessel occlusion. There was occlusion of the right PCA P3 segment and left PCA at its origin. These are age indeterminate. There was multifocal atherosclerotic irregularity of the right middle cerebral artery. Carotid and vertebral arteries were normal.  I saw the patient with Dr. Jaynee Eagles of neurology. Dr. Jaynee Eagles has recommended that if the patient is in atrial fibrillation, the patient be placed on Eliquis 2.5 mg bid for anticoagulation with discontinuation of aspirin. EKG was ordered to evaluate for this arrhythmia, and it demonstrated only normal sinus rhythm. The patient has been evaluated by cardiology and will go to EP Cardiology office Monday morning for placement of a loop recorder. EEG was performed today that was consistent with metabolic encephalopathy, but was without epileptiform activity. She will follow up as outpatient with Dr. Delice Lesch of [facility]. She will discharge to home today in fair condition.  Today's assessment:  S: The patient is resting comfortably. No new complaints. O: Vitals:  Vitals:   02/29/20 0751 02/29/20 1152  BP: 134/74 (!) 161/74  Pulse: 73  79  Resp: 16 16  Temp: 98.3 F (36.8 C) 97.9 F (36.6 C)  SpO2: 98% 96%   Exam:  Constitutional:  . The patient is awake, alert, and oriented x 3. No acute distress. Eyes:  . pupils and irises appear normal . Normal lids and conjunctivae ENMT:  . grossly normal hearing  . Lips appear normal . external ears, nose appear normal . Oropharynx: mucosa, tongue,posterior pharynx appear normal Neck:  . neck appears normal, no masses, normal ROM, supple . no thyromegaly Respiratory:  . No increased work of breathing. . No wheezes, rales, or rhonchi . No tactile fremitus Cardiovascular:  . Regular rate and rhythm . No murmurs, ectopy, or gallups. . No lateral PMI. No thrills. Abdomen:  . Abdomen is soft, non-tender, non-distended . No hernias, masses, or organomegaly . Normoactive bowel sounds.  Musculoskeletal:  . No cyanosis, clubbing, or edema Skin:  . No rashes, lesions, ulcers . palpation of skin: no induration or nodules Neurologic:  . CN 2-12 intact . Sensation all 4 extremities intact Psychiatric:  . Mental status o Mood, affect appropriate o Orientation to person, place, time  . judgment and insight appear intact Discharge Instructions  Discharge Instructions    Activity as tolerated - No restrictions   Complete by: As directed    Call MD for:  persistant nausea and vomiting   Complete by: As directed    Call MD for:  severe uncontrolled pain   Complete by: As directed    Diet - low sodium heart healthy   Complete by: As directed    Discharge instructions   Complete by: As directed    Discharge to home with home health RN, PT. Follow up with PCP in 7-10 days. Follow up with Dr. Tilden Dome in stroke clinic in 6-8 weeks.   Increase activity slowly   Complete by: As directed      Allergies as of 03/01/2020      Reactions   Donepezil Nausea And Vomiting   Alendronate Other (See Comments)   Difficulty swallowing   Atorvastatin Other (See Comments)   Leg  cramping   Bactrim [sulfamethoxazole-trimethoprim] Nausea And Vomiting   Macrodantin [nitrofurantoin Macrocrystal] Other (See Comments)   Unknown reaction   Simvastatin Other (See Comments)   Leg cramping   Sulfa Antibiotics Nausea And Vomiting   Azithromycin Rash      Medication List    STOP taking these medications   aspirin 81 MG chewable tablet Replaced by: aspirin 325 MG tablet     TAKE these medications   aspirin 325 MG tablet Take 1 tablet (325 mg total) by mouth daily. Start taking on: March 02, 2020 Replaces: aspirin 81 MG chewable tablet   clopidogrel 75 MG tablet Commonly known as: PLAVIX Take 1 tablet (75 mg total) by mouth daily. Start taking on: March 02, 2020   escitalopram 5 MG tablet Commonly known as: LEXAPRO TAKE 1 TABLET BY MOUTH EVERYDAY AT BEDTIME   Melatonin Gummies 2.5 MG Chew Chew 1 tablet by mouth daily as needed (For sleep).   metoprolol tartrate 25 MG tablet Commonly known as: LOPRESSOR Take 0.5 tablets (12.5 mg total) by mouth 2 (two) times daily.       Allergies  Allergen Reactions  . Donepezil Nausea And Vomiting  . Alendronate Other (See Comments)  Difficulty swallowing  . Atorvastatin Other (See Comments)    Leg cramping  . Bactrim [Sulfamethoxazole-Trimethoprim] Nausea And Vomiting  . Macrodantin [Nitrofurantoin Macrocrystal] Other (See Comments)    Unknown reaction  . Simvastatin Other (See Comments)    Leg cramping  . Sulfa Antibiotics Nausea And Vomiting  . Azithromycin Rash    The results of significant diagnostics from this hospitalization (including imaging, microbiology, ancillary and laboratory) are listed below for reference.    Significant Diagnostic Studies: CT ANGIO HEAD W OR WO CONTRAST  Result Date: 02/28/2020 CLINICAL DATA:  Launch template CTA head neck EXAM: CT ANGIOGRAPHY HEAD AND NECK TECHNIQUE: Multidetector CT imaging of the head and neck was performed using the standard protocol during bolus  administration of intravenous contrast. Multiplanar CT image reconstructions and MIPs were obtained to evaluate the vascular anatomy. Carotid stenosis measurements (when applicable) are obtained utilizing NASCET criteria, using the distal internal carotid diameter as the denominator. CONTRAST:  55mL OMNIPAQUE IOHEXOL 350 MG/ML SOLN COMPARISON:  None. FINDINGS: CTA NECK FINDINGS SKELETON: There is no bony spinal canal stenosis. No lytic or blastic lesion. OTHER NECK: Normal pharynx, larynx and major salivary glands. No cervical lymphadenopathy. Unremarkable thyroid gland. UPPER CHEST: No pneumothorax or pleural effusion. No nodules or masses. AORTIC ARCH: There is no calcific atherosclerosis of the aortic arch. There is no aneurysm, dissection or hemodynamically significant stenosis of the visualized portion of the aorta. Conventional 3 vessel aortic branching pattern. The visualized proximal subclavian arteries are widely patent. RIGHT CAROTID SYSTEM: Normal without aneurysm, dissection or stenosis. LEFT CAROTID SYSTEM: Normal without aneurysm, dissection or stenosis. VERTEBRAL ARTERIES: Left dominant configuration. Both origins are clearly patent. There is no dissection, occlusion or flow-limiting stenosis to the skull base (V1-V3 segments). CTA HEAD FINDINGS POSTERIOR CIRCULATION: --Vertebral arteries: Normal V4 segments. --Posterior inferior cerebellar arteries (PICA): Patent origins from the vertebral arteries. --Anterior inferior cerebellar arteries (AICA): Patent origins from the basilar artery. --Basilar artery: Normal. --Superior cerebellar arteries: Normal. --Posterior cerebral arteries: The left PCA is occluded near its origin. The right PCA is occluded at its P3 segment. ANTERIOR CIRCULATION: --Intracranial internal carotid arteries: Normal. --Anterior cerebral arteries (ACA): Normal. Both A1 segments are present. Patent anterior communicating artery (a-comm). --Middle cerebral arteries (MCA): Multifocal  atherosclerotic irregularity of the right MCA. Left MCA is normal. VENOUS SINUSES: As permitted by contrast timing, patent. ANATOMIC VARIANTS: None Review of the MIP images confirms the above findings. IMPRESSION: 1. No emergent large vessel occlusion. 2. Occlusion of the right PCA P3 segment and left PCA at its origin. These findings are age indeterminate given the lack of acute ischemia within the PCA territory on the earlier MRI. 3. Multifocal atherosclerotic irregularity of the right middle cerebral artery. 4. Normal carotid and vertebral arteries. Electronically Signed   By: Ulyses Jarred M.D.   On: 02/28/2020 19:51   DG Chest 1 View  Result Date: 02/28/2020 CLINICAL DATA:  Altered level of consciousness question infection EXAM: CHEST  1 VIEW COMPARISON:  11/09/2018 FINDINGS: Normal heart size, mediastinal contours, and pulmonary vascularity. Lungs clear. No infiltrate, pleural effusion or pneumothorax. Bowel interposition between liver and diaphragm again seen. IMPRESSION: No acute abnormalities. Electronically Signed   By: Lavonia Dana M.D.   On: 02/28/2020 14:48   CT HEAD WO CONTRAST  Result Date: 02/28/2020 CLINICAL DATA:  Altered mental status, less alert, unable to follow commands EXAM: CT HEAD WITHOUT CONTRAST TECHNIQUE: Contiguous axial images were obtained from the base of the skull through the vertex without intravenous  contrast. Sagittal and coronal MPR images reconstructed from axial data set. COMPARISON:  01/04/2020 FINDINGS: Brain: Generalized atrophy. Normal ventricular morphology. No midline shift or mass effect. Small vessel chronic ischemic changes of deep cerebral white matter. Old LEFT basal ganglia lacunar infarcts. Again identified RIGHT occipital infarct. No intracranial hemorrhage, mass lesion, or evidence of acute infarction. No extra-axial fluid collections. Vascular: Atherosclerotic calcification of internal carotid arteries bilaterally at skull base. No hyperdense vessels.  Skull: Intact Sinuses/Orbits: Clear Other: N/A IMPRESSION: Atrophy with small vessel chronic ischemic changes of deep cerebral white matter. Old RIGHT occipital and LEFT basal ganglia infarcts. No acute intracranial abnormalities. Electronically Signed   By: Lavonia Dana M.D.   On: 02/28/2020 13:49   CT ANGIO NECK W OR WO CONTRAST  Result Date: 02/28/2020 CLINICAL DATA:  Launch template CTA head neck EXAM: CT ANGIOGRAPHY HEAD AND NECK TECHNIQUE: Multidetector CT imaging of the head and neck was performed using the standard protocol during bolus administration of intravenous contrast. Multiplanar CT image reconstructions and MIPs were obtained to evaluate the vascular anatomy. Carotid stenosis measurements (when applicable) are obtained utilizing NASCET criteria, using the distal internal carotid diameter as the denominator. CONTRAST:  15mL OMNIPAQUE IOHEXOL 350 MG/ML SOLN COMPARISON:  None. FINDINGS: CTA NECK FINDINGS SKELETON: There is no bony spinal canal stenosis. No lytic or blastic lesion. OTHER NECK: Normal pharynx, larynx and major salivary glands. No cervical lymphadenopathy. Unremarkable thyroid gland. UPPER CHEST: No pneumothorax or pleural effusion. No nodules or masses. AORTIC ARCH: There is no calcific atherosclerosis of the aortic arch. There is no aneurysm, dissection or hemodynamically significant stenosis of the visualized portion of the aorta. Conventional 3 vessel aortic branching pattern. The visualized proximal subclavian arteries are widely patent. RIGHT CAROTID SYSTEM: Normal without aneurysm, dissection or stenosis. LEFT CAROTID SYSTEM: Normal without aneurysm, dissection or stenosis. VERTEBRAL ARTERIES: Left dominant configuration. Both origins are clearly patent. There is no dissection, occlusion or flow-limiting stenosis to the skull base (V1-V3 segments). CTA HEAD FINDINGS POSTERIOR CIRCULATION: --Vertebral arteries: Normal V4 segments. --Posterior inferior cerebellar arteries (PICA):  Patent origins from the vertebral arteries. --Anterior inferior cerebellar arteries (AICA): Patent origins from the basilar artery. --Basilar artery: Normal. --Superior cerebellar arteries: Normal. --Posterior cerebral arteries: The left PCA is occluded near its origin. The right PCA is occluded at its P3 segment. ANTERIOR CIRCULATION: --Intracranial internal carotid arteries: Normal. --Anterior cerebral arteries (ACA): Normal. Both A1 segments are present. Patent anterior communicating artery (a-comm). --Middle cerebral arteries (MCA): Multifocal atherosclerotic irregularity of the right MCA. Left MCA is normal. VENOUS SINUSES: As permitted by contrast timing, patent. ANATOMIC VARIANTS: None Review of the MIP images confirms the above findings. IMPRESSION: 1. No emergent large vessel occlusion. 2. Occlusion of the right PCA P3 segment and left PCA at its origin. These findings are age indeterminate given the lack of acute ischemia within the PCA territory on the earlier MRI. 3. Multifocal atherosclerotic irregularity of the right middle cerebral artery. 4. Normal carotid and vertebral arteries. Electronically Signed   By: Ulyses Jarred M.D.   On: 02/28/2020 19:51   MR BRAIN WO CONTRAST  Result Date: 02/28/2020 CLINICAL DATA:  Altered mental status EXAM: MRI HEAD WITHOUT CONTRAST TECHNIQUE: Multiplanar, multiecho pulse sequences of the brain and surrounding structures were obtained without intravenous contrast. COMPARISON:  2019 FINDINGS: Brain: Few small foci mildly reduced diffusion present in the right corona radiata. Confluent areas of T2 hyperintensity in the supratentorial and pontine white matter are nonspecific but probably reflect moderate to advanced  chronic microvascular ischemic changes. There is a chronic infarct of the left occipital lobe. Minimal small foci of susceptibility are present, for example within the right cerebellum and right frontal subcortical white matter likely reflecting chronic  microhemorrhages. Chronic small vessel infarct along the left caudothalamic groove. Prominence of the ventricles and sulci reflecting generalized parenchymal volume loss. Most of these findings have progressed since 2019. There is no intracranial mass or significant mass effect. Altered mental status or evidence Vascular: Major vessel flow voids at the skull base are preserved. Skull and upper cervical spine: Normal marrow signal is preserved. Sinuses/Orbits: Paranasal sinuses are aerated. Orbits are unremarkable. Other: Sella is unremarkable.  Mastoid air cells are clear. IMPRESSION: Few small foci of acute infarction in the right corona radiata. Chronic infarcts, chronic microvascular ischemic changes, and parenchymal volume loss with progression since 2019. Electronically Signed   By: Macy Mis M.D.   On: 02/28/2020 16:26   ECHOCARDIOGRAM COMPLETE  Result Date: 02/29/2020    ECHOCARDIOGRAM REPORT   Patient Name:   Sheila Parrish Date of Exam: 02/29/2020 Medical Rec #:  [id number]      Height:       59.0 in Accession #:    [id number]     Weight:       114.0 lb Date of Birth:  [birth year]       BSA:          1.452 m Patient Age:    45 years       BP:           134/74 mmHg Patient Gender: F              HR:           73 bpm. Exam Location:  Inpatient Procedure: 2D Echo, Cardiac Doppler and Color Doppler Indications:    CVA  History:        Patient has prior history of Echocardiogram examinations, most                 recent 11/09/2018. CHF, Stroke, Signs/Symptoms:Altered Mental                 Status; Risk Factors:Hypertension and Dyslipidemia. Dementia.  Sonographer:    Dustin Flock Referring Phys: [id number] Mckinley Jewel  Sonographer Comments: AMS. IMPRESSIONS  1. Left ventricular ejection fraction, by estimation, is 65 to 70%. The left ventricle has normal function. The left ventricle has no regional wall motion abnormalities. Left ventricular diastolic parameters are consistent with Grade II diastolic  dysfunction (pseudonormalization).  2. Right ventricular systolic function is normal. The right ventricular size is normal. There is mildly elevated pulmonary artery systolic pressure. The estimated right ventricular systolic pressure is Q000111Q mmHg.  3. The mitral valve is normal in structure. Trivial mitral valve regurgitation. No evidence of mitral stenosis.  4. The aortic valve is grossly normal. Aortic valve regurgitation is trivial. No aortic stenosis is present.  5. The inferior vena cava is normal in size with greater than 50% respiratory variability, suggesting right atrial pressure of 3 mmHg. Conclusion(s)/Recommendation(s): No intracardiac source of embolism detected on this transthoracic study. A transesophageal echocardiogram can be considered to exclude cardiac source of embolism if clinically indicated. FINDINGS  Left Ventricle: Left ventricular ejection fraction, by estimation, is 65 to 70%. The left ventricle has normal function. The left ventricle has no regional wall motion abnormalities. The left ventricular internal cavity size was normal in size. There is  no left ventricular hypertrophy. Left  ventricular diastolic parameters are consistent with Grade II diastolic dysfunction (pseudonormalization). Right Ventricle: The right ventricular size is normal. No increase in right ventricular wall thickness. Right ventricular systolic function is normal. There is mildly elevated pulmonary artery systolic pressure. The tricuspid regurgitant velocity is 2.82  m/s, and with an assumed right atrial pressure of 5 mmHg, the estimated right ventricular systolic pressure is Q000111Q mmHg. Left Atrium: Left atrial size was normal in size. Right Atrium: Right atrial size was normal in size. Pericardium: There is no evidence of pericardial effusion. Mitral Valve: The mitral valve is normal in structure. Normal mobility of the mitral valve leaflets. Trivial mitral valve regurgitation. No evidence of mitral valve  stenosis. Tricuspid Valve: The tricuspid valve is normal in structure. Tricuspid valve regurgitation is trivial. No evidence of tricuspid stenosis. Aortic Valve: The aortic valve is grossly normal. Aortic valve regurgitation is trivial. Aortic regurgitation PHT measures 445 msec. No aortic stenosis is present. Pulmonic Valve: The pulmonic valve was normal in structure. Pulmonic valve regurgitation is trivial. No evidence of pulmonic stenosis. Aorta: The aortic root is normal in size and structure. Venous: The inferior vena cava is normal in size with greater than 50% respiratory variability, suggesting right atrial pressure of 3 mmHg. IAS/Shunts: No atrial level shunt detected by color flow Doppler.  LEFT VENTRICLE PLAX 2D LVIDd:         2.70 cm  Diastology LVIDs:         1.90 cm  LV e' lateral:   7.72 cm/s LV PW:         0.80 cm  LV E/e' lateral: 9.7 LV IVS:        0.70 cm  LV e' medial:    4.35 cm/s LVOT diam:     1.80 cm  LV E/e' medial:  17.2 LV SV:         74 LV SV Index:   51 LVOT Area:     2.54 cm  RIGHT VENTRICLE RV Basal diam:  1.70 cm RV S prime:     9.90 cm/s TAPSE (M-mode): 2.4 cm LEFT ATRIUM             Index       RIGHT ATRIUM           Index LA diam:        2.50 cm 1.72 cm/m  RA Area:     11.00 cm LA Vol (A2C):   31.0 ml 21.34 ml/m RA Volume:   23.80 ml  16.39 ml/m LA Vol (A4C):   27.1 ml 18.66 ml/m LA Biplane Vol: 28.8 ml 19.83 ml/m  AORTIC VALVE LVOT Vmax:   113.00 cm/s LVOT Vmean:  85.700 cm/s LVOT VTI:    0.289 m AI PHT:      445 msec  AORTA Ao Root diam: 1.70 cm MITRAL VALVE               TRICUSPID VALVE MV Area (PHT): 5.97 cm    TR Peak grad:   31.8 mmHg MV Decel Time: 127 msec    TR Vmax:        282.00 cm/s MV E velocity: 75.00 cm/s MV A velocity: 65.60 cm/s  SHUNTS MV E/A ratio:  1.14        Systemic VTI:  0.29 m                            Systemic Diam: 1.80 cm Cherlynn Kaiser  MD Electronically signed by Cherlynn Kaiser MD Signature Date/Time: 02/29/2020/10:56:10 AM    Final      Microbiology: Recent Results (from the past 240 hour(s))  SARS CORONAVIRUS 2 (TAT 6-24 HRS) Nasopharyngeal Nasopharyngeal Swab     Status: None   Collection Time: 02/28/20  4:11 PM   Specimen: Nasopharyngeal Swab  Result Value Ref Range Status   SARS Coronavirus 2 NEGATIVE NEGATIVE Final    Comment: (NOTE) SARS-CoV-2 target nucleic acids are NOT DETECTED. The SARS-CoV-2 RNA is generally detectable in upper and lower respiratory specimens during the acute phase of infection. Negative results do not preclude SARS-CoV-2 infection, do not rule out co-infections with other pathogens, and should not be used as the sole basis for treatment or other patient management decisions. Negative results must be combined with clinical observations, patient history, and epidemiological information. The expected result is Negative. Fact Sheet for Patients: SugarRoll.be Fact Sheet for Healthcare Providers: https://www.woods-mathews.com/ This test is not yet approved or cleared by the [place] FDA and  has been authorized for detection and/or diagnosis of SARS-CoV-2 by FDA under an Emergency Use Authorization (EUA). This EUA will remain  in effect (meaning this test can be used) for the duration of the COVID-19 declaration under Section 56 4(b)(1) of the Act, 21 U.S.C. section 360bbb-3(b)(1), unless the authorization is terminated or revoked sooner. Performed at [facility], [place] [address] [place], [place] [address]      Labs: Basic Metabolic Panel: Recent Labs  Lab 02/28/20 1150 02/29/20 0333  NA 141 140  K 4.4 3.3*  CL 104 105  CO2 25 25  GLUCOSE 108* 100*  BUN 18 13  CREATININE 0.82 0.77  CALCIUM 9.4 8.9   Liver Function Tests: Recent Labs  Lab 02/28/20 1150  AST 27  ALT 15  ALKPHOS 62  BILITOT 0.9  PROT 6.4*  ALBUMIN 3.5   No results for input(s): LIPASE, AMYLASE in the last 168 hours. No results for input(s):  AMMONIA in the last 168 hours. CBC: Recent Labs  Lab 02/28/20 1150 02/29/20 0333  WBC 5.9 6.0  NEUTROABS  --  3.8  HGB 13.5 11.6*  HCT 41.6 35.0*  MCV 92.9 90.4  PLT 262 221   Cardiac Enzymes: No results for input(s): CKTOTAL, CKMB, CKMBINDEX, TROPONINI in the last 168 hours. BNP: BNP (last 3 results) No results for input(s): BNP in the last 8760 hours.  ProBNP (last 3 results) No results for input(s): PROBNP in the last 8760 hours.  CBG: Recent Labs  Lab 02/28/20 1144 02/29/20 0613 02/29/20 1157  GLUCAP 96 86 134*    Principal Problem:   AMS (altered mental status) Active Problems:   CVA (cerebral vascular accident) ([place]) Hypertension Hyperlipidemia  Time coordinating discharge: 38 minutes  Signed:        Celines Femia, DO [facility]  03/01/2020, 3:44 PM

## 2020-02-29 NOTE — Evaluation (Signed)
Clinical/Bedside Swallow Evaluation Patient Details  Name: Sheila Parrish MRN: [id number] Date of Birth: [birth year]  Today's Date: 02/29/2020 Time: SLP Start Time (ACUTE ONLY): 0845 SLP Stop Time (ACUTE ONLY): 0910 SLP Time Calculation (min) (ACUTE ONLY): 25 min  Past Medical History:  Past Medical History:  Diagnosis Date  . High cholesterol   . Scarlet fever 1936   Hospitalized for a month   Past Surgical History:  Past Surgical History:  Procedure Laterality Date  . ABDOMINAL HYSTERECTOMY    . TOTAL KNEE ARTHROPLASTY Left 2010   HPI:   Sheila Parrish is a 86 y.o. female with medical history significant of CVA in January 2021, dementia, chronic diastolic congestive heart failure, PVCs, hypertension, hyperlipidemia presents to emergency department with altered mental status. MRI shows Few small foci of acute infarction in the right corona radiata. Chronic infarcts, chronic microvascular ischemic changes, and parenchymal volume loss with progression since 2019. Daughter reports that patient lives in assisted living facility requiring 24-hour help.  While patient was brushing her teeth she became unresponsive with a glazed look on her face, became weak and started shaking.  The episode lasted for 30 minutes.  Patient did not pass out at that time.  Patient had stroke in January 2021 however patient was not hospitalized at that time.  She was started on aspirin 81 mg by her PCP.  Her CT head scan at that time revealed right occipital lobe subacute stroke.   Assessment / Plan / Recommendation Clinical Impression  Pt demonstrates no signs of oral dysphagia. Though she is inattentive, when given a cup or straw, she took sips automatically, self fed with a spoon, masticated a cookie. Discussed with Rn and advised supervision at least with first meal given significant cognitive deficits. See next note for cognitive eval, will sign off for swallowing.  SLP Visit Diagnosis: Dysphagia, unspecified  (R13.10)    Aspiration Risk  Mild aspiration risk    Diet Recommendation Regular;Thin liquid   Liquid Administration via: Cup;Straw Medication Administration: Whole meds with liquid Supervision: Staff to assist with self feeding Compensations: Minimize environmental distractions Postural Changes: Seated upright at 90 degrees    Other  Recommendations     Follow up Recommendations Skilled Nursing facility      Frequency and Duration            Prognosis Prognosis for Safe Diet Advancement: Good      Swallow Study   General HPI:  Sheila Parrish is a 86 y.o. female with medical history significant of CVA in January 2021, dementia, chronic diastolic congestive heart failure, PVCs, hypertension, hyperlipidemia presents to emergency department with altered mental status. MRI shows Few small foci of acute infarction in the right corona radiata. Chronic infarcts, chronic microvascular ischemic changes, and parenchymal volume loss with progression since 2019. Daughter reports that patient lives in assisted living facility requiring 24-hour help.  While patient was brushing her teeth she became unresponsive with a glazed look on her face, became weak and started shaking.  The episode lasted for 30 minutes.  Patient did not pass out at that time.  Patient had stroke in January 2021 however patient was not hospitalized at that time.  She was started on aspirin 81 mg by her PCP.  Her CT head scan at that time revealed right occipital lobe subacute stroke. Type of Study: Bedside Swallow Evaluation Previous Swallow Assessment: none Diet Prior to this Study: NPO Temperature Spikes Noted: No Respiratory Status: Room air History of  Recent Intubation: No Behavior/Cognition: Alert;Doesn't follow directions;Requires cueing Oral Cavity Assessment: Within Functional Limits Oral Care Completed by SLP: No Oral Cavity - Dentition: Adequate natural dentition Vision: Functional for  self-feeding Self-Feeding Abilities: Able to feed self Patient Positioning: Upright in bed Baseline Vocal Quality: Normal    Oral/Motor/Sensory Function Overall Oral Motor/Sensory Function: Within functional limits   Ice Chips     Thin Liquid Thin Liquid: Within functional limits    Nectar Thick Nectar Thick Liquid: Not tested   Honey Thick Honey Thick Liquid: Not tested   Puree Puree: Within functional limits   Solid     Solid: Within functional limits     Herbie [place], MA [place] Pager [phone] Office [phone]  Kensly, Farin 02/29/2020,10:02 AM

## 2020-02-29 NOTE — Care Management Obs Status (Signed)
[place] NOTIFICATION   Patient Details  Name: Sheila Parrish MRN: [id number] Date of Birth: 10-Mar-1927   Medicare Observation Status Notification Given:  Yes    Emeterio Reeve, Latanya Presser 02/29/2020, 4:19 PM

## 2020-02-29 NOTE — Progress Notes (Signed)
  Echocardiogram 2D Echocardiogram has been performed.  Sheila Parrish 02/29/2020, 8:30 AM

## 2020-02-29 NOTE — Evaluation (Signed)
Occupational Therapy Evaluation Patient Details Name: Sheila Parrish MRN: [id number] DOB: 01/28/1927 Today's Date: 02/29/2020    History of Present Illness Sheila Parrish is a 86 y.o. female with medical history significant of CVA in January 2021 (right occipital subacute stroke), dementia, chronic diastolic congestive heart failure, PVCs, hypertension, hyperlipidemia presents to emergency department with altered mental status. MRI shows Few small foci of acute infarction in the right corona radiata.    Clinical Impression   PT admitted with AMS with R corona radiata infarct on MRI. Pt currently with functional limitiations due to the deficits listed below (see OT problem list). Pt currently requires (A) with adls and demonstrates perseveration on cleaning during adls. Pt needs redirection to next task. Daughter feels pt can return to independent living with CNA (A) as previous at current level.  Pt will benefit from skilled OT to increase their independence and safety with adls and balance to allow discharge [place].     Follow Up Recommendations  Home health OT    Equipment Recommendations  None recommended by OT    Recommendations for Other Services       Precautions / Restrictions Precautions Precautions: Fall Restrictions Weight Bearing Restrictions: No      Mobility Bed Mobility Overal bed mobility: Needs Assistance Bed Mobility: Supine to Sit     Supine to sit: Mod assist     General bed mobility comments: ModA to elevate trunk to sitting position  Transfers Overall transfer level: Needs assistance Equipment used: Rolling walker (2 wheeled) Transfers: Sit to/from Stand Sit to Stand: Min assist         General transfer comment: MinA to rise from edge of bed    Balance Overall balance assessment: Needs assistance Sitting-balance support: Feet supported Sitting balance-Leahy Scale: Good     Standing balance support: No upper extremity supported;During  functional activity Standing balance-Leahy Scale: Fair Standing balance comment: Washing hands at sink with supervision                           ADL either performed or assessed with clinical judgement   ADL Overall ADL's : Needs assistance/impaired Eating/Feeding: Set up;Sitting Eating/Feeding Details (indicate cue type and reason): cues for sequence or to initiate Grooming: Wash/dry hands;Minimal assistance;Sitting               Lower Body Dressing: Total assistance               Functional mobility during ADLs: Minimal assistance General ADL Comments: pt unable to provide expressive verbal response but will initiate the task with comamnds     Vision         Perception     Praxis      Pertinent Vitals/Pain Pain Assessment: Faces Faces Pain Scale: No hurt     Hand Dominance Right   Extremity/Trunk Assessment Upper Extremity Assessment Upper Extremity Assessment: Generalized weakness   Lower Extremity Assessment Lower Extremity Assessment: Defer to PT evaluation   Cervical / Trunk Assessment Cervical / Trunk Assessment: Kyphotic   Communication Communication Communication: Expressive difficulties   Cognition Arousal/Alertness: Awake/alert Behavior During Therapy: Flat affect Overall Cognitive Status: History of cognitive impairments - at baseline                                 General Comments: Pt with history of dementia and recent stroke. Responds yes/no intermittently.  Follows 1 step motor tasks with hand over hand guidance.    General Comments       Exercises     Shoulder Instructions      Home Living Family/patient expects to be discharged to:: Private residence Living Arrangements: Alone Available Help at Discharge: Family;Personal care attendant;Available 24 hours/day Type of Home: Independent living facility                       Home Equipment: Walker - 4 wheels   Additional Comments: Lives  at [place]      Prior Functioning/Environment Level of Independence: Needs assistance  Gait / Transfers Assistance Needed: uses walker ADL's / Homemaking Assistance Needed: requires assist for ADL's   Comments: Has 24 hour CNA supervision/assist        OT Problem List: Decreased strength;Decreased activity tolerance;Impaired balance (sitting and/or standing);Decreased safety awareness;Decreased knowledge of use of DME or AE;Decreased knowledge of precautions      OT Treatment/Interventions: Self-care/ADL training;Therapeutic exercise;Neuromuscular education;Energy conservation;DME and/or AE instruction;Manual therapy;Therapeutic activities;Balance training;Patient/family education;Cognitive remediation/compensation    OT Goals(Current goals can be found in the care plan section) Acute Rehab OT Goals Patient Stated Goal: pt daughter would like her to go back to [facility] OT Goal Formulation: With family Time For Goal Achievement: 02/29/20 Potential to Achieve Goals: Good  OT Frequency: Min 2X/week   Barriers to D/C:            Co-evaluation PT/OT/SLP Co-Evaluation/Treatment: Yes Reason for Co-Treatment: For patient/therapist safety;To address functional/ADL transfers PT goals addressed during session: Mobility/safety with mobility OT goals addressed during session: ADL's and self-care;Strengthening/ROM      AM-PAC OT "6 Clicks" Daily Activity     Outcome Measure Help from another person eating meals?: A Little Help from another person taking care of personal grooming?: A Little Help from another person toileting, which includes using toliet, bedpan, or urinal?: A Lot Help from another person bathing (including washing, rinsing, drying)?: A Lot Help from another person to put on and taking off regular upper body clothing?: A Little Help from another person to put on and taking off regular lower body clothing?: A Lot 6 Click Score: 15   End of Session  Equipment Utilized During Treatment: Gait belt;Rolling walker Nurse Communication: Mobility status;Precautions  Activity Tolerance: Patient tolerated treatment well Patient left: in chair;with call bell/phone within reach;with chair alarm set;with family/visitor present  OT Visit Diagnosis: Unsteadiness on feet (R26.81);Muscle weakness (generalized) (M62.81)                Time: [id number] OT Time Calculation (min): 29 min Charges:  OT General Charges $OT Visit: 1 Visit OT Evaluation $OT Eval Moderate Complexity: 1 Mod   Brynn, OTR/L  Acute Rehabilitation Services Pager: [phone] Office: [phone] .   Jeri Modena 02/29/2020, 3:40 PM

## 2020-02-29 NOTE — Progress Notes (Addendum)
STROKE TEAM PROGRESS NOTE   INTERVAL HISTORY Her daughter is at bedside, we discussed afib and embolic strokes  Vitals:   02/28/20 2327 02/29/20 0425 02/29/20 0751 02/29/20 1152  BP: (!) 129/52 (!) 113/56 134/74 (!) 161/74  Pulse: 65 67 73 79  Resp: 19 17 16 16   Temp: (!) 97.5 F (36.4 C) (!) 97.5 F (36.4 C) 98.3 F (36.8 C) 97.9 F (36.6 C)  TempSrc: Oral Oral Oral Axillary  SpO2: 96% 98% 98% 96%  Weight:      Height:        CBC:  Recent Labs  Lab 02/28/20 1150 02/29/20 0333  WBC 5.9 6.0  NEUTROABS  --  3.8  HGB 13.5 11.6*  HCT 41.6 35.0*  MCV 92.9 90.4  PLT 262 [id number]    Basic Metabolic Panel:  Recent Labs  Lab 02/28/20 1150 02/29/20 0333  NA 141 140  K 4.4 3.3*  CL 104 105  CO2 25 25  GLUCOSE 108* 100*  BUN 18 13  CREATININE 0.82 0.77  CALCIUM 9.4 8.9   Lipid Panel:     Component Value Date/Time   CHOL 214 (H) 02/29/2020 0333   TRIG 77 02/29/2020 0333   HDL 32 (L) 02/29/2020 0333   CHOLHDL 6.7 02/29/2020 0333   VLDL 15 02/29/2020 0333   LDLCALC 167 (H) 02/29/2020 0333   HgbA1c:  Lab Results  Component Value Date   HGBA1C 5.8 (H) 02/29/2020   Urine Drug Screen: No results found for: LABOPIA, COCAINSCRNUR, LABBENZ, AMPHETMU, THCU, LABBARB  Alcohol Level No results found for: ETH  IMAGING past 24 hours CT ANGIO HEAD W OR WO CONTRAST  Result Date: 02/28/2020 CLINICAL DATA:  Launch template CTA head neck EXAM: CT ANGIOGRAPHY HEAD AND NECK TECHNIQUE: Multidetector CT imaging of the head and neck was performed using the standard protocol during bolus administration of intravenous contrast. Multiplanar CT image reconstructions and MIPs were obtained to evaluate the vascular anatomy. Carotid stenosis measurements (when applicable) are obtained utilizing NASCET criteria, using the distal internal carotid diameter as the denominator. CONTRAST:  74mL OMNIPAQUE IOHEXOL 350 MG/ML SOLN COMPARISON:  None. FINDINGS: CTA NECK FINDINGS SKELETON: There is no bony  spinal canal stenosis. No lytic or blastic lesion. OTHER NECK: Normal pharynx, larynx and major salivary glands. No cervical lymphadenopathy. Unremarkable thyroid gland. UPPER CHEST: No pneumothorax or pleural effusion. No nodules or masses. AORTIC ARCH: There is no calcific atherosclerosis of the aortic arch. There is no aneurysm, dissection or hemodynamically significant stenosis of the visualized portion of the aorta. Conventional 3 vessel aortic branching pattern. The visualized proximal subclavian arteries are widely patent. RIGHT CAROTID SYSTEM: Normal without aneurysm, dissection or stenosis. LEFT CAROTID SYSTEM: Normal without aneurysm, dissection or stenosis. VERTEBRAL ARTERIES: Left dominant configuration. Both origins are clearly patent. There is no dissection, occlusion or flow-limiting stenosis to the skull base (V1-V3 segments). CTA HEAD FINDINGS POSTERIOR CIRCULATION: --Vertebral arteries: Normal V4 segments. --Posterior inferior cerebellar arteries (PICA): Patent origins from the vertebral arteries. --Anterior inferior cerebellar arteries (AICA): Patent origins from the basilar artery. --Basilar artery: Normal. --Superior cerebellar arteries: Normal. --Posterior cerebral arteries: The left PCA is occluded near its origin. The right PCA is occluded at its P3 segment. ANTERIOR CIRCULATION: --Intracranial internal carotid arteries: Normal. --Anterior cerebral arteries (ACA): Normal. Both A1 segments are present. Patent anterior communicating artery (a-comm). --Middle cerebral arteries (MCA): Multifocal atherosclerotic irregularity of the right MCA. Left MCA is normal. VENOUS SINUSES: As permitted by contrast timing, patent. ANATOMIC VARIANTS: None Review  of the MIP images confirms the above findings. IMPRESSION: 1. No emergent large vessel occlusion. 2. Occlusion of the right PCA P3 segment and left PCA at its origin. These findings are age indeterminate given the lack of acute ischemia within the PCA  territory on the earlier MRI. 3. Multifocal atherosclerotic irregularity of the right middle cerebral artery. 4. Normal carotid and vertebral arteries. Electronically Signed   By: Ulyses Jarred M.D.   On: 02/28/2020 19:51   CT ANGIO NECK W OR WO CONTRAST  Result Date: 02/28/2020 CLINICAL DATA:  Launch template CTA head neck EXAM: CT ANGIOGRAPHY HEAD AND NECK TECHNIQUE: Multidetector CT imaging of the head and neck was performed using the standard protocol during bolus administration of intravenous contrast. Multiplanar CT image reconstructions and MIPs were obtained to evaluate the vascular anatomy. Carotid stenosis measurements (when applicable) are obtained utilizing NASCET criteria, using the distal internal carotid diameter as the denominator. CONTRAST:  75mL OMNIPAQUE IOHEXOL 350 MG/ML SOLN COMPARISON:  None. FINDINGS: CTA NECK FINDINGS SKELETON: There is no bony spinal canal stenosis. No lytic or blastic lesion. OTHER NECK: Normal pharynx, larynx and major salivary glands. No cervical lymphadenopathy. Unremarkable thyroid gland. UPPER CHEST: No pneumothorax or pleural effusion. No nodules or masses. AORTIC ARCH: There is no calcific atherosclerosis of the aortic arch. There is no aneurysm, dissection or hemodynamically significant stenosis of the visualized portion of the aorta. Conventional 3 vessel aortic branching pattern. The visualized proximal subclavian arteries are widely patent. RIGHT CAROTID SYSTEM: Normal without aneurysm, dissection or stenosis. LEFT CAROTID SYSTEM: Normal without aneurysm, dissection or stenosis. VERTEBRAL ARTERIES: Left dominant configuration. Both origins are clearly patent. There is no dissection, occlusion or flow-limiting stenosis to the skull base (V1-V3 segments). CTA HEAD FINDINGS POSTERIOR CIRCULATION: --Vertebral arteries: Normal V4 segments. --Posterior inferior cerebellar arteries (PICA): Patent origins from the vertebral arteries. --Anterior inferior cerebellar  arteries (AICA): Patent origins from the basilar artery. --Basilar artery: Normal. --Superior cerebellar arteries: Normal. --Posterior cerebral arteries: The left PCA is occluded near its origin. The right PCA is occluded at its P3 segment. ANTERIOR CIRCULATION: --Intracranial internal carotid arteries: Normal. --Anterior cerebral arteries (ACA): Normal. Both A1 segments are present. Patent anterior communicating artery (a-comm). --Middle cerebral arteries (MCA): Multifocal atherosclerotic irregularity of the right MCA. Left MCA is normal. VENOUS SINUSES: As permitted by contrast timing, patent. ANATOMIC VARIANTS: None Review of the MIP images confirms the above findings. IMPRESSION: 1. No emergent large vessel occlusion. 2. Occlusion of the right PCA P3 segment and left PCA at its origin. These findings are age indeterminate given the lack of acute ischemia within the PCA territory on the earlier MRI. 3. Multifocal atherosclerotic irregularity of the right middle cerebral artery. 4. Normal carotid and vertebral arteries. Electronically Signed   By: Ulyses Jarred M.D.   On: 02/28/2020 19:51   MR BRAIN WO CONTRAST  Result Date: 02/28/2020 CLINICAL DATA:  Altered mental status EXAM: MRI HEAD WITHOUT CONTRAST TECHNIQUE: Multiplanar, multiecho pulse sequences of the brain and surrounding structures were obtained without intravenous contrast. COMPARISON:  2019 FINDINGS: Brain: Few small foci mildly reduced diffusion present in the right corona radiata. Confluent areas of T2 hyperintensity in the supratentorial and pontine white matter are nonspecific but probably reflect moderate to advanced chronic microvascular ischemic changes. There is a chronic infarct of the left occipital lobe. Minimal small foci of susceptibility are present, for example within the right cerebellum and right frontal subcortical white matter likely reflecting chronic microhemorrhages. Chronic small vessel infarct along the  left caudothalamic  groove. Prominence of the ventricles and sulci reflecting generalized parenchymal volume loss. Most of these findings have progressed since 2019. There is no intracranial mass or significant mass effect. Altered mental status or evidence Vascular: Major vessel flow voids at the skull base are preserved. Skull and upper cervical spine: Normal marrow signal is preserved. Sinuses/Orbits: Paranasal sinuses are aerated. Orbits are unremarkable. Other: Sella is unremarkable.  Mastoid air cells are clear. IMPRESSION: Few small foci of acute infarction in the right corona radiata. Chronic infarcts, chronic microvascular ischemic changes, and parenchymal volume loss with progression since 2019. Electronically Signed   By: Macy Mis M.D.   On: 02/28/2020 16:26   ECHOCARDIOGRAM COMPLETE  Result Date: 02/29/2020    ECHOCARDIOGRAM REPORT   Patient Name:   COREENA TASSY Date of Exam: 02/29/2020 Medical Rec #:  [id number]      Height:       59.0 in Accession #:    [id number]     Weight:       114.0 lb Date of Birth:  03/01/1927       BSA:          1.452 m Patient Age:    41 years       BP:           134/74 mmHg Patient Gender: F              HR:           73 bpm. Exam Location:  Inpatient Procedure: 2D Echo, Cardiac Doppler and Color Doppler Indications:    CVA  History:        Patient has prior history of Echocardiogram examinations, most                 recent 11/09/2018. CHF, Stroke, Signs/Symptoms:Altered Mental                 Status; Risk Factors:Hypertension and Dyslipidemia. Dementia.  Sonographer:    Dustin Flock Referring Phys: [id number] Mckinley Jewel  Sonographer Comments: AMS. IMPRESSIONS  1. Left ventricular ejection fraction, by estimation, is 65 to 70%. The left ventricle has normal function. The left ventricle has no regional wall motion abnormalities. Left ventricular diastolic parameters are consistent with Grade II diastolic dysfunction (pseudonormalization).  2. Right ventricular systolic function  is normal. The right ventricular size is normal. There is mildly elevated pulmonary artery systolic pressure. The estimated right ventricular systolic pressure is Q000111Q mmHg.  3. The mitral valve is normal in structure. Trivial mitral valve regurgitation. No evidence of mitral stenosis.  4. The aortic valve is grossly normal. Aortic valve regurgitation is trivial. No aortic stenosis is present.  5. The inferior vena cava is normal in size with greater than 50% respiratory variability, suggesting right atrial pressure of 3 mmHg. Conclusion(s)/Recommendation(s): No intracardiac source of embolism detected on this transthoracic study. A transesophageal echocardiogram can be considered to exclude cardiac source of embolism if clinically indicated. FINDINGS  Left Ventricle: Left ventricular ejection fraction, by estimation, is 65 to 70%. The left ventricle has normal function. The left ventricle has no regional wall motion abnormalities. The left ventricular internal cavity size was normal in size. There is  no left ventricular hypertrophy. Left ventricular diastolic parameters are consistent with Grade II diastolic dysfunction (pseudonormalization). Right Ventricle: The right ventricular size is normal. No increase in right ventricular wall thickness. Right ventricular systolic function is normal. There is mildly elevated pulmonary artery systolic pressure. The tricuspid regurgitant  velocity is 2.82  m/s, and with an assumed right atrial pressure of 5 mmHg, the estimated right ventricular systolic pressure is Q000111Q mmHg. Left Atrium: Left atrial size was normal in size. Right Atrium: Right atrial size was normal in size. Pericardium: There is no evidence of pericardial effusion. Mitral Valve: The mitral valve is normal in structure. Normal mobility of the mitral valve leaflets. Trivial mitral valve regurgitation. No evidence of mitral valve stenosis. Tricuspid Valve: The tricuspid valve is normal in structure. Tricuspid  valve regurgitation is trivial. No evidence of tricuspid stenosis. Aortic Valve: The aortic valve is grossly normal. Aortic valve regurgitation is trivial. Aortic regurgitation PHT measures 445 msec. No aortic stenosis is present. Pulmonic Valve: The pulmonic valve was normal in structure. Pulmonic valve regurgitation is trivial. No evidence of pulmonic stenosis. Aorta: The aortic root is normal in size and structure. Venous: The inferior vena cava is normal in size with greater than 50% respiratory variability, suggesting right atrial pressure of 3 mmHg. IAS/Shunts: No atrial level shunt detected by color flow Doppler.  LEFT VENTRICLE PLAX 2D LVIDd:         2.70 cm  Diastology LVIDs:         1.90 cm  LV e' lateral:   7.72 cm/s LV PW:         0.80 cm  LV E/e' lateral: 9.7 LV IVS:        0.70 cm  LV e' medial:    4.35 cm/s LVOT diam:     1.80 cm  LV E/e' medial:  17.2 LV SV:         74 LV SV Index:   51 LVOT Area:     2.54 cm  RIGHT VENTRICLE RV Basal diam:  1.70 cm RV S prime:     9.90 cm/s TAPSE (M-mode): 2.4 cm LEFT ATRIUM             Index       RIGHT ATRIUM           Index LA diam:        2.50 cm 1.72 cm/m  RA Area:     11.00 cm LA Vol (A2C):   31.0 ml 21.34 ml/m RA Volume:   23.80 ml  16.39 ml/m LA Vol (A4C):   27.1 ml 18.66 ml/m LA Biplane Vol: 28.8 ml 19.83 ml/m  AORTIC VALVE LVOT Vmax:   113.00 cm/s LVOT Vmean:  85.700 cm/s LVOT VTI:    0.289 m AI PHT:      445 msec  AORTA Ao Root diam: 1.70 cm MITRAL VALVE               TRICUSPID VALVE MV Area (PHT): 5.97 cm    TR Peak grad:   31.8 mmHg MV Decel Time: 127 msec    TR Vmax:        282.00 cm/s MV E velocity: 75.00 cm/s MV A velocity: 65.60 cm/s  SHUNTS MV E/A ratio:  1.14        Systemic VTI:  0.29 m                            Systemic Diam: 1.80 cm Cherlynn Kaiser MD Electronically signed by Cherlynn Kaiser MD Signature Date/Time: 02/29/2020/10:56:10 AM    Final     PHYSICAL EXAM  Neurological exam Mental status, awake alert oriented to self.   She is very slow to respond to questions.  Was  able to say her name but was unable to tell me her age or date of birth on repetitive questioning. She was able to name a few simple objects such as pen and hand but could not name uncommon objects.  Against slowed respond to questions. Her speech is not dysarthric. Cranial nerves: Pupils equal round react light, extraocular movements appear intact, difficult assess visual fields due to her participation but she blinks to threat from both sides, face appears symmetric, tongue and palate appear midline. Motor exam: She is antigravity in all 4 extremities. Sensory exam: Intact light touch Coordination: No obvious dysmetria Gait testing deferred at this time  ASSESSMENT/PLAN Ms. Sheila Parrish is a 86 y.o. female past medical history of moderate advanced dementia residing at a assisted living facility requiring 24-hour ADL help, recent stroke in January 2021 not hospitalized-noted by primary care on imaging, hypertension, hyperlipidemia presented to the emergency room for an episode this morning after the facility where she became less responsive with a glazed look on her face, did not pass out, became weak all over and then had some right-sided shaking.  Stroke embolic secondary to unknown source  Code Stroke No acute abnormalities  CTA head & neck: No emergent large vessel occlusion  MRI: Few small foci of acute infarction in the right corona radiata. Chronic infarcts, chronic microvascular ischemic changes  2D Echo: no thrombus or pfo, EF 60-65%  LDL 167  HgbA1c 5.8  subq heparin for VTE prophylaxis    Diet   Diet regular Room service appropriate? Yes; Fluid consistency: Thin     ASA 81mg  prior to admission, now on ASA325mg . Patient will need possible loop recorder, discussed with attending who is going to call cardiology.  Therapy recommendations:  pending  Disposition:  pending  Hypertension  Home meds:   metoprolol  Stable . Permissive hypertension (OK if < 220/120) but gradually normalize in 5-7 days . Long-term BP goal normotensive  Hyperlipidemia  Home meds:  none  LDL 167, goal < 70  Recommend adding Lipitor 40mg   Pre-Diabetes type II Controlled  Home meds:  none  HgbA1c 5.8, goal < 7.0  CBGs Recent Labs    02/28/20 1144 02/29/20 0613 02/29/20 1157  GLUCAP 96 86 134*      SSI  Other Stroke Risk Factors  Advanced age  Hx stroke/TIA  Other Active Problems  Dementia  Hospital day # 0  Personally examined patient and images, and have participated in and made any corrections needed to history, physical, neuro exam,assessment and plan as stated above.  I have personally obtained the history, evaluated lab date, reviewed imaging studies and agree with radiology interpretations.    Sarina Ill, MD Stroke Neurology   A total of 35 minutes was spent for the care of this patient, spent on counseling patient and family on different diagnostic and therapeutic options, counseling and coordination of care, riskd ans benefits of management, compliance, or risk factor reduction and education.   To contact Stroke Continuity provider, please refer to [email or web address] After hours, contact General Neurology

## 2020-02-29 NOTE — Consult Note (Addendum)
Cardiology Consultation:   Patient ID: JAHIA DILUZIO MRN: [id number]; DOB: 09/03/1927  Admit date: 02/28/2020 Date of Consult: 02/29/2020  Primary Care Provider: Lavone Orn, MD Primary Cardiologist: Sheila Him, MD  Primary Electrophysiologist:  None    Patient Profile:   Sheila Parrish is a 86 y.o. female with a hx of syncope in 2017 with negative work-up, diastolic CHF, PVCs, HLD, normal LVEF in 2017 with grade 1 DD who is being seen today for the evaluation of need for loop recorder at the request of Sheila Parrish.  History of Present Illness:   Sheila Parrish has a past medical history as above. She was admitted on 02/28/20 with altered mental status.  She apparently had a stroke in January 2021 however she was not hospitalized at that time.  She was started on aspirin 81 mg by her PCP.  Per review of notes her head CT scan at that time revealed right occipital lobe subacute stroke.  Her metoprolol was discontinued on previous admission due to syncope.  She has been seen by neurology.  She was initially started on Keppra for seizure prophylaxis and given aspirin.  She was given a stroke work-up.  MRI confirmed a few small foci of acute infarct in the right corona radiata.  Echocardiogram showed EF 65-70% with no regional wall motion abnormalities, grade 2 diastolic dysfunction, mildly elevated pulmonary artery systolic pressure at Q000111Q mmHg.  There was no intracardiac source of embolism detected.  CTA of the head and neck demonstrated no emergent large vessel occlusion.  There was occlusion of the right PCA P3 segment and left PCA at its origin.  These are age-indeterminate.  There was multifocal atherosclerotic irregularity of the right middle cerebral artery.  Carotid and pedal arteries were normal.  There was initially a question of atrial fibrillation on EKG.  On review of multiple EKGs I do not see any atrial fibrillation.  Review of all of her telemetry reveals normal sinus rhythm with no  atrial fibrillation.  Cardiology was called to assess for possible atrial fibrillation and if none arrange for a loop recorder.  I spoke to Sheila Parrish with the EP and he can place loop recorder on Monday morning at our office.  I have advised the patient and her caregiver that they should arrive on Monday morning at 8:00 in the morning.  They are agreeable.  She has 24 7 home caregivers and they will have to arrange for her transportation.   Past Medical History:  Diagnosis Date  . High cholesterol   . Scarlet fever 1936   Hospitalized for a month    Past Surgical History:  Procedure Laterality Date  . ABDOMINAL HYSTERECTOMY    . TOTAL KNEE ARTHROPLASTY Left 2010     Home Medications:  Prior to Admission medications   Medication Sig Start Date End Date Taking? Authorizing Provider  aspirin 81 MG chewable tablet Chew 81 mg by mouth daily.   Yes [provider]  Melatonin Gummies 2.5 MG CHEW Chew 1 tablet by mouth daily as needed (For sleep).   Yes [provider]  apixaban (ELIQUIS) 2.5 MG TABS tablet Take 1 tablet (2.5 mg total) by mouth 2 (two) times daily. 02/29/20   Swayze, Ava, DO  escitalopram (LEXAPRO) 5 MG tablet TAKE 1 TABLET BY MOUTH EVERYDAY AT BEDTIME Patient not taking: Reported on 02/28/2020 03/21/19   Tat, Eustace Quail, DO  metoprolol tartrate (LOPRESSOR) 25 MG tablet Take 0.5 tablets (12.5 mg total) by mouth 2 (  two) times daily. 02/29/20 02/28/21  Swayze, Ava, DO    Inpatient Medications: Scheduled Meds: . aspirin  325 mg Oral Daily  . enoxaparin (LOVENOX) injection  40 mg Subcutaneous Q24H   Continuous Infusions: . sodium chloride 250 mL (02/29/20 0625)  . levETIRAcetam 500 mg (02/29/20 1732)   PRN Meds: sodium chloride, acetaminophen **OR** acetaminophen (TYLENOL) oral liquid 160 mg/5 mL **OR** acetaminophen, senna-docusate  Allergies:    Allergies  Allergen Reactions  . Donepezil Nausea And Vomiting  . Alendronate Other (See Comments)     Difficulty swallowing  . Atorvastatin Other (See Comments)    Leg cramping  . Bactrim [Sulfamethoxazole-Trimethoprim] Nausea And Vomiting  . Macrodantin [Nitrofurantoin Macrocrystal] Other (See Comments)    Unknown reaction  . Simvastatin Other (See Comments)    Leg cramping  . Sulfa Antibiotics Nausea And Vomiting  . Azithromycin Rash    Social History:   Social History   Socioeconomic History  . Marital status: Married    Spouse name: Not on file  . Number of children: Not on file  . Years of education: Not on file  . Highest education level: Not on file  Occupational History  . Occupation: Retired  Tobacco Use  . Smoking status: Never Smoker  . Smokeless tobacco: Never Used  Substance and Sexual Activity  . Alcohol use: No  . Drug use: No  . Sexual activity: Not on file  Other Topics Concern  . Not on file  Social History Narrative   Lives in 1 story apartment on the 2nd floor, with her husband   Has 3 adult children   4 year degree   Retired 1st grade teacher   Social Determinants of Radio broadcast assistant Strain:   . Difficulty of Paying Living Expenses:   Food Insecurity:   . Worried About Charity fundraiser in the Last Year:   . Arboriculturist in the Last Year:   Transportation Needs:   . Film/video editor (Medical):   [name] Kitchen Lack of Transportation (Non-Medical):   Physical Activity:   . Days of Exercise per Week:   . Minutes of Exercise per Session:   Stress:   . Feeling of Stress :   Social Connections:   . Frequency of Communication with Friends and Family:   . Frequency of Social Gatherings with Friends and Family:   . Attends Religious Services:   . Active Member of Clubs or Organizations:   . Attends Archivist Meetings:   [name] Kitchen Marital Status:   Intimate Partner Violence:   . Fear of Current or Ex-Partner:   . Emotionally Abused:   [name] Kitchen Physically Abused:   . Sexually Abused:     Family History:    Family History  Problem  Relation Age of Onset  . Arthritis Mother        Died at 7  . Stroke Father 17       Died at age 33  . CAD Neg Hx      ROS:  Please see the history of present illness.   All other ROS reviewed and negative.     Physical Exam/Data:   Vitals:   02/29/20 0425 02/29/20 0751 02/29/20 1152 02/29/20 1722  BP: (!) 113/56 134/74 (!) 161/74 111/62  Pulse: 67 73 79 87  Resp: 17 16 16  (!) 23  Temp: (!) 97.5 F (36.4 C) 98.3 F (36.8 C) 97.9 F (36.6 C) 97.9 F (36.6 C)  TempSrc: Oral Oral  Axillary Axillary  SpO2: 98% 98% 96% 94%  Weight:      Height:        Intake/Output Summary (Last 24 hours) at 02/29/2020 1805 Last data filed at 02/29/2020 0450 Gross per 24 hour  Intake 161.83 ml  Output --  Net 161.83 ml   Last 3 Weights 02/28/2020 12/19/2018 11/09/2018  Weight (lbs) 114 lb 137 lb 12.8 oz 134 lb 14.7 oz  Weight (kg) 51.71 kg 62.506 kg 61.2 kg     Body mass index is 23.03 kg/m.  General:  Well nourished, well developed, in no acute distress HEENT: normal Neck: no JVD Vascular: No carotid bruits; pedal pulses 2+ bilaterally Cardiac:  normal S1, S2; RRR; no murmur  Lungs:  clear to auscultation bilaterally, no wheezing, rhonchi or rales  Abd: soft, nontender, no hepatomegaly  Ext: Trace lower leg edema Musculoskeletal: Moves all extremities Skin: warm and dry  Neuro: Patient is alert and talks, although slow to respond Psych: Blunted affect   EKG:  The EKG was personally reviewed and demonstrates: Normal sinus rhythm, 87 bpm Telemetry:  Telemetry was personally reviewed and demonstrates: Normal sinus rhythm  Relevant CV Studies:  Echocardiogram 02/29/2020 IMPRESSIONS   1. Left ventricular ejection fraction, by estimation, is 65 to 70%. The  left ventricle has normal function. The left ventricle has no regional  wall motion abnormalities. Left ventricular diastolic parameters are  consistent with Grade II diastolic  dysfunction (pseudonormalization).  2. Right  ventricular systolic function is normal. The right ventricular  size is normal. There is mildly elevated pulmonary artery systolic  pressure. The estimated right ventricular systolic pressure is Q000111Q mmHg.  3. The mitral valve is normal in structure. Trivial mitral valve  regurgitation. No evidence of mitral stenosis.  4. The aortic valve is grossly normal. Aortic valve regurgitation is  trivial. No aortic stenosis is present.  5. The inferior vena cava is normal in size with greater than 50%  respiratory variability, suggesting right atrial pressure of 3 mmHg.   Conclusion(s)/Recommendation(s): No intracardiac source of embolism  detected on this transthoracic study. A transesophageal echocardiogram can  be considered to exclude cardiac source of embolism if clinically  indicated.   Laboratory Data:  High Sensitivity Troponin:  No results for input(s): TROPONINIHS in the last 720 hours.   Chemistry Recent Labs  Lab 02/28/20 1150 02/29/20 0333  NA 141 140  K 4.4 3.3*  CL 104 105  CO2 25 25  GLUCOSE 108* 100*  BUN 18 13  CREATININE 0.82 0.77  CALCIUM 9.4 8.9  GFRNONAA >60 >60  GFRAA >60 >60  ANIONGAP 12 10    Recent Labs  Lab 02/28/20 1150  PROT 6.4*  ALBUMIN 3.5  AST 27  ALT 15  ALKPHOS 62  BILITOT 0.9   Hematology Recent Labs  Lab 02/28/20 1150 02/29/20 0333  WBC 5.9 6.0  RBC 4.48 3.87  HGB 13.5 11.6*  HCT 41.6 35.0*  MCV 92.9 90.4  MCH 30.1 30.0  MCHC 32.5 33.1  RDW 13.1 13.1  PLT 262 221   BNPNo results for input(s): BNP, PROBNP in the last 168 hours.  DDimer No results for input(s): DDIMER in the last 168 hours.   Radiology/Studies:  CT ANGIO HEAD W OR WO CONTRAST  Result Date: 02/28/2020 CLINICAL DATA:  Launch template CTA head neck EXAM: CT ANGIOGRAPHY HEAD AND NECK TECHNIQUE: Multidetector CT imaging of the head and neck was performed using the standard protocol during bolus administration of intravenous contrast. Multiplanar  CT image  reconstructions and MIPs were obtained to evaluate the vascular anatomy. Carotid stenosis measurements (when applicable) are obtained utilizing NASCET criteria, using the distal internal carotid diameter as the denominator. CONTRAST:  79mL OMNIPAQUE IOHEXOL 350 MG/ML SOLN COMPARISON:  None. FINDINGS: CTA NECK FINDINGS SKELETON: There is no bony spinal canal stenosis. No lytic or blastic lesion. OTHER NECK: Normal pharynx, larynx and major salivary glands. No cervical lymphadenopathy. Unremarkable thyroid gland. UPPER CHEST: No pneumothorax or pleural effusion. No nodules or masses. AORTIC ARCH: There is no calcific atherosclerosis of the aortic arch. There is no aneurysm, dissection or hemodynamically significant stenosis of the visualized portion of the aorta. Conventional 3 vessel aortic branching pattern. The visualized proximal subclavian arteries are widely patent. RIGHT CAROTID SYSTEM: Normal without aneurysm, dissection or stenosis. LEFT CAROTID SYSTEM: Normal without aneurysm, dissection or stenosis. VERTEBRAL ARTERIES: Left dominant configuration. Both origins are clearly patent. There is no dissection, occlusion or flow-limiting stenosis to the skull base (V1-V3 segments). CTA HEAD FINDINGS POSTERIOR CIRCULATION: --Vertebral arteries: Normal V4 segments. --Posterior inferior cerebellar arteries (PICA): Patent origins from the vertebral arteries. --Anterior inferior cerebellar arteries (AICA): Patent origins from the basilar artery. --Basilar artery: Normal. --Superior cerebellar arteries: Normal. --Posterior cerebral arteries: The left PCA is occluded near its origin. The right PCA is occluded at its P3 segment. ANTERIOR CIRCULATION: --Intracranial internal carotid arteries: Normal. --Anterior cerebral arteries (ACA): Normal. Both A1 segments are present. Patent anterior communicating artery (a-comm). --Middle cerebral arteries (MCA): Multifocal atherosclerotic irregularity of the right MCA. Left MCA is  normal. VENOUS SINUSES: As permitted by contrast timing, patent. ANATOMIC VARIANTS: None Review of the MIP images confirms the above findings. IMPRESSION: 1. No emergent large vessel occlusion. 2. Occlusion of the right PCA P3 segment and left PCA at its origin. These findings are age indeterminate given the lack of acute ischemia within the PCA territory on the earlier MRI. 3. Multifocal atherosclerotic irregularity of the right middle cerebral artery. 4. Normal carotid and vertebral arteries. Electronically Signed   By: Ulyses Jarred M.D.   On: 02/28/2020 19:51   DG Chest 1 View  Result Date: 02/28/2020 CLINICAL DATA:  Altered level of consciousness question infection EXAM: CHEST  1 VIEW COMPARISON:  11/09/2018 FINDINGS: Normal heart size, mediastinal contours, and pulmonary vascularity. Lungs clear. No infiltrate, pleural effusion or pneumothorax. Bowel interposition between liver and diaphragm again seen. IMPRESSION: No acute abnormalities. Electronically Signed   By: Lavonia Dana M.D.   On: 02/28/2020 14:48   CT HEAD WO CONTRAST  Result Date: 02/28/2020 CLINICAL DATA:  Altered mental status, less alert, unable to follow commands EXAM: CT HEAD WITHOUT CONTRAST TECHNIQUE: Contiguous axial images were obtained from the base of the skull through the vertex without intravenous contrast. Sagittal and coronal MPR images reconstructed from axial data set. COMPARISON:  01/04/2020 FINDINGS: Brain: Generalized atrophy. Normal ventricular morphology. No midline shift or mass effect. Small vessel chronic ischemic changes of deep cerebral white matter. Old LEFT basal ganglia lacunar infarcts. Again identified RIGHT occipital infarct. No intracranial hemorrhage, mass lesion, or evidence of acute infarction. No extra-axial fluid collections. Vascular: Atherosclerotic calcification of internal carotid arteries bilaterally at skull base. No hyperdense vessels. Skull: Intact Sinuses/Orbits: Clear Other: N/A IMPRESSION:  Atrophy with small vessel chronic ischemic changes of deep cerebral white matter. Old RIGHT occipital and LEFT basal ganglia infarcts. No acute intracranial abnormalities. Electronically Signed   By: Lavonia Dana M.D.   On: 02/28/2020 13:49   CT ANGIO NECK W OR WO  CONTRAST  Result Date: 02/28/2020 CLINICAL DATA:  Launch template CTA head neck EXAM: CT ANGIOGRAPHY HEAD AND NECK TECHNIQUE: Multidetector CT imaging of the head and neck was performed using the standard protocol during bolus administration of intravenous contrast. Multiplanar CT image reconstructions and MIPs were obtained to evaluate the vascular anatomy. Carotid stenosis measurements (when applicable) are obtained utilizing NASCET criteria, using the distal internal carotid diameter as the denominator. CONTRAST:  50mL OMNIPAQUE IOHEXOL 350 MG/ML SOLN COMPARISON:  None. FINDINGS: CTA NECK FINDINGS SKELETON: There is no bony spinal canal stenosis. No lytic or blastic lesion. OTHER NECK: Normal pharynx, larynx and major salivary glands. No cervical lymphadenopathy. Unremarkable thyroid gland. UPPER CHEST: No pneumothorax or pleural effusion. No nodules or masses. AORTIC ARCH: There is no calcific atherosclerosis of the aortic arch. There is no aneurysm, dissection or hemodynamically significant stenosis of the visualized portion of the aorta. Conventional 3 vessel aortic branching pattern. The visualized proximal subclavian arteries are widely patent. RIGHT CAROTID SYSTEM: Normal without aneurysm, dissection or stenosis. LEFT CAROTID SYSTEM: Normal without aneurysm, dissection or stenosis. VERTEBRAL ARTERIES: Left dominant configuration. Both origins are clearly patent. There is no dissection, occlusion or flow-limiting stenosis to the skull base (V1-V3 segments). CTA HEAD FINDINGS POSTERIOR CIRCULATION: --Vertebral arteries: Normal V4 segments. --Posterior inferior cerebellar arteries (PICA): Patent origins from the vertebral arteries. --Anterior  inferior cerebellar arteries (AICA): Patent origins from the basilar artery. --Basilar artery: Normal. --Superior cerebellar arteries: Normal. --Posterior cerebral arteries: The left PCA is occluded near its origin. The right PCA is occluded at its P3 segment. ANTERIOR CIRCULATION: --Intracranial internal carotid arteries: Normal. --Anterior cerebral arteries (ACA): Normal. Both A1 segments are present. Patent anterior communicating artery (a-comm). --Middle cerebral arteries (MCA): Multifocal atherosclerotic irregularity of the right MCA. Left MCA is normal. VENOUS SINUSES: As permitted by contrast timing, patent. ANATOMIC VARIANTS: None Review of the MIP images confirms the above findings. IMPRESSION: 1. No emergent large vessel occlusion. 2. Occlusion of the right PCA P3 segment and left PCA at its origin. These findings are age indeterminate given the lack of acute ischemia within the PCA territory on the earlier MRI. 3. Multifocal atherosclerotic irregularity of the right middle cerebral artery. 4. Normal carotid and vertebral arteries. Electronically Signed   By: Ulyses Jarred M.D.   On: 02/28/2020 19:51   MR BRAIN WO CONTRAST  Result Date: 02/28/2020 CLINICAL DATA:  Altered mental status EXAM: MRI HEAD WITHOUT CONTRAST TECHNIQUE: Multiplanar, multiecho pulse sequences of the brain and surrounding structures were obtained without intravenous contrast. COMPARISON:  2019 FINDINGS: Brain: Few small foci mildly reduced diffusion present in the right corona radiata. Confluent areas of T2 hyperintensity in the supratentorial and pontine white matter are nonspecific but probably reflect moderate to advanced chronic microvascular ischemic changes. There is a chronic infarct of the left occipital lobe. Minimal small foci of susceptibility are present, for example within the right cerebellum and right frontal subcortical white matter likely reflecting chronic microhemorrhages. Chronic small vessel infarct along the  left caudothalamic groove. Prominence of the ventricles and sulci reflecting generalized parenchymal volume loss. Most of these findings have progressed since 2019. There is no intracranial mass or significant mass effect. Altered mental status or evidence Vascular: Major vessel flow voids at the skull base are preserved. Skull and upper cervical spine: Normal marrow signal is preserved. Sinuses/Orbits: Paranasal sinuses are aerated. Orbits are unremarkable. Other: Sella is unremarkable.  Mastoid air cells are clear. IMPRESSION: Few small foci of acute infarction in the right corona  radiata. Chronic infarcts, chronic microvascular ischemic changes, and parenchymal volume loss with progression since 2019. Electronically Signed   By: Macy Mis M.D.   On: 02/28/2020 16:26   ECHOCARDIOGRAM COMPLETE  Result Date: 02/29/2020    ECHOCARDIOGRAM REPORT   Patient Name:   TAYZLEE SPEZIALE Date of Exam: 02/29/2020 Medical Rec #:  [id number]      Height:       59.0 in Accession #:    [id number]     Weight:       114.0 lb Date of Birth:  May 11, 1927       BSA:          1.452 m Patient Age:    71 years       BP:           134/74 mmHg Patient Gender: F              HR:           73 bpm. Exam Location:  Inpatient Procedure: 2D Echo, Cardiac Doppler and Color Doppler Indications:    CVA  History:        Patient has prior history of Echocardiogram examinations, most                 recent 11/09/2018. CHF, Stroke, Signs/Symptoms:Altered Mental                 Status; Risk Factors:Hypertension and Dyslipidemia. Dementia.  Sonographer:    Dustin Flock Referring Phys: [id number] Mckinley Jewel  Sonographer Comments: AMS. IMPRESSIONS  1. Left ventricular ejection fraction, by estimation, is 65 to 70%. The left ventricle has normal function. The left ventricle has no regional wall motion abnormalities. Left ventricular diastolic parameters are consistent with Grade II diastolic dysfunction (pseudonormalization).  2. Right ventricular  systolic function is normal. The right ventricular size is normal. There is mildly elevated pulmonary artery systolic pressure. The estimated right ventricular systolic pressure is Q000111Q mmHg.  3. The mitral valve is normal in structure. Trivial mitral valve regurgitation. No evidence of mitral stenosis.  4. The aortic valve is grossly normal. Aortic valve regurgitation is trivial. No aortic stenosis is present.  5. The inferior vena cava is normal in size with greater than 50% respiratory variability, suggesting right atrial pressure of 3 mmHg. Conclusion(s)/Recommendation(s): No intracardiac source of embolism detected on this transthoracic study. A transesophageal echocardiogram can be considered to exclude cardiac source of embolism if clinically indicated. FINDINGS  Left Ventricle: Left ventricular ejection fraction, by estimation, is 65 to 70%. The left ventricle has normal function. The left ventricle has no regional wall motion abnormalities. The left ventricular internal cavity size was normal in size. There is  no left ventricular hypertrophy. Left ventricular diastolic parameters are consistent with Grade II diastolic dysfunction (pseudonormalization). Right Ventricle: The right ventricular size is normal. No increase in right ventricular wall thickness. Right ventricular systolic function is normal. There is mildly elevated pulmonary artery systolic pressure. The tricuspid regurgitant velocity is 2.82  m/s, and with an assumed right atrial pressure of 5 mmHg, the estimated right ventricular systolic pressure is Q000111Q mmHg. Left Atrium: Left atrial size was normal in size. Right Atrium: Right atrial size was normal in size. Pericardium: There is no evidence of pericardial effusion. Mitral Valve: The mitral valve is normal in structure. Normal mobility of the mitral valve leaflets. Trivial mitral valve regurgitation. No evidence of mitral valve stenosis. Tricuspid Valve: The tricuspid valve is normal in  structure. Tricuspid  valve regurgitation is trivial. No evidence of tricuspid stenosis. Aortic Valve: The aortic valve is grossly normal. Aortic valve regurgitation is trivial. Aortic regurgitation PHT measures 445 msec. No aortic stenosis is present. Pulmonic Valve: The pulmonic valve was normal in structure. Pulmonic valve regurgitation is trivial. No evidence of pulmonic stenosis. Aorta: The aortic root is normal in size and structure. Venous: The inferior vena cava is normal in size with greater than 50% respiratory variability, suggesting right atrial pressure of 3 mmHg. IAS/Shunts: No atrial level shunt detected by color flow Doppler.  LEFT VENTRICLE PLAX 2D LVIDd:         2.70 cm  Diastology LVIDs:         1.90 cm  LV e' lateral:   7.72 cm/s LV PW:         0.80 cm  LV E/e' lateral: 9.7 LV IVS:        0.70 cm  LV e' medial:    4.35 cm/s LVOT diam:     1.80 cm  LV E/e' medial:  17.2 LV SV:         74 LV SV Index:   51 LVOT Area:     2.54 cm  RIGHT VENTRICLE RV Basal diam:  1.70 cm RV S prime:     9.90 cm/s TAPSE (M-mode): 2.4 cm LEFT ATRIUM             Index       RIGHT ATRIUM           Index LA diam:        2.50 cm 1.72 cm/m  RA Area:     11.00 cm LA Vol (A2C):   31.0 ml 21.34 ml/m RA Volume:   23.80 ml  16.39 ml/m LA Vol (A4C):   27.1 ml 18.66 ml/m LA Biplane Vol: 28.8 ml 19.83 ml/m  AORTIC VALVE LVOT Vmax:   113.00 cm/s LVOT Vmean:  85.700 cm/s LVOT VTI:    0.289 m AI PHT:      445 msec  AORTA Ao Root diam: 1.70 cm MITRAL VALVE               TRICUSPID VALVE MV Area (PHT): 5.97 cm    TR Peak grad:   31.8 mmHg MV Decel Time: 127 msec    TR Vmax:        282.00 cm/s MV E velocity: 75.00 cm/s MV A velocity: 65.60 cm/s  SHUNTS MV E/A ratio:  1.14        Systemic VTI:  0.29 m                            Systemic Diam: 1.80 cm Cherlynn Kaiser MD Electronically signed by Cherlynn Kaiser MD Signature Date/Time: 02/29/2020/10:56:10 AM    Final          Assessment and Plan:   Embolic stroke secondary to  unknown source -Echocardiogram with normal LV systolic function, EF Q000111Q, no regional wall motion abnormalities, grade 2 diastolic dysfunction.  No intracardiac source of embolism detected on TTE.  A TEE could be considered to exclude cardiac source of embolism. -No atrial fibrillation on review of multiple EKGs and of telemetry. -Neurology is requesting loop recorder to be placed.  -We can place loop recorder on Monday morning. Will have pt come to our office at 8:00 am.   Hyperlipidemia -LDL 167.  Goal LDL less than 70 with stroke. -Lipitor 40 mg daily recommended  per neurology  Hypertension -Previous metoprolol had been discontinued due to syncope.  She was not on any antihypertensives prior to admission. -Blood pressure is well controlled to mildly elevated.  Acceptable with some permissive hypertension in setting of stroke.   [facility] [facility] will sign off.   Medication Recommendations:  None Other recommendations (labs, testing, etc):  none Follow up as an outpatient:  Plan for placement of loop recorder on Monday at 8:00 at our [facility] office. Placed on AVS.  For questions or updates, please contact [place] Please consult [email or web address] for contact info under     Signed, Daune Perch, NP  02/29/2020 6:05 PM  I have seen and examined the patient along with Daune Perch, NP .  I have reviewed the chart, notes and new data.  I agree with PA/NP's note.  Key new complaints: history/ROS from chart and patient's daughter (medical POA) due to patient's cognitive and communication deficits Key examination changes: normal CV exam.  Key new findings / data: reviewed all ECG tracing and telemetry. No atrial fibrillation is seen.  PLAN: Cryptogenic stroke. High level of suspicion for asymptomatic atrial fibrillation due to age and presence of ectopic atrial tachycardia. But without confirmation of emboligenic arrhythmia, it is hard to justify full anticoagulation. Daughter  is concerned about the risk of sedation with TEE, but understands the purpose of loop recorder and is agreeable to it.  This procedure has been fully reviewed with the patient and informed consent has been obtained from patient and daughter/POA.   Sanda Klein, MD, [place] [phone] 02/29/2020, 8:11 PM

## 2020-02-29 NOTE — Progress Notes (Addendum)
PROGRESS NOTE  Sheila Parrish [id number] DOB: December 20, 1926 DOA: 02/28/2020 PCP: Lavone Orn, MD  Brief History   Sheila Parrish a 86 y.o.femalewith medical history significant ofCVA in January 2021, dementia, chronic diastolic congestive heart failure, PVCs, hypertension, hyperlipidemia presents to emergency department with altered mental status.   Patient's daughter [name] is at bedside who is the historian. She reports that patient lives in assisted living facility requiring 24-hour help. This morning while patient was brushing her teeth she became unresponsive with a glazed look on her face, became weak and started shaking. The episode lasted for 30 minutes. Patient did not pass out at that time. EMS was called and brought patient to the emergency department for further evaluation and management.  Patient had stroke in January 2021 however patient was not hospitalized at that time.She was started on aspirin 81 mg by her PCP. Her CT head scan at that time revealed right occipital lobe subacute stroke. She only takes aspirin 81 mg once daily. Her metoprolol was discontinued on previous admission due to syncope.  No history of headache, chest pain, shortness of breath, palpitation, leg swelling, fever, chills, nausea, vomiting, diarrhea, urinary symptoms.  She uses wheelchair for ambulation at baseline.  [facility] were consulted to admit the patient for further evaluation and treatment.  Hospital Course:  The patient was admitted to a telemetry bed. Neurology was consulted. She was initially placed on IV Keppra for seizure prophylaxis. The patient was given an aspirin. She was given a stroke work up. MRI confirmed a few small foci of acute infarction in the right corona radiata. Echocardiogram was obtained that demonstrated EF of 65-70% with no regional wall motion abnormalities. There was a grade II diastolic dysfunction. There was normal LV systolic  function with a mildly elevated pulmonary artery systolic pressure at [id number] mmHg. No intracardiac source of embolism was detected. CTA head and neck demonstrated no emergent large vessel occlusion. There was occlusion of the right PCA P3 segment and left PCA at its origin. These are age indeterminate. There was multifocal atherosclerotic irregularity of the right middle cerebral artery. Carotid and vertebral arteries were normal.  I saw the patient with Dr. Jaynee Eagles of neurology. Dr. Jaynee Eagles has recommended that if the patient is in atrial fibrillation, the patient be placed on Eliquis 2.5 mg bid for anticoagulation with discontinuation of aspirin. EKG on admission does not demonstrate atrial fibrillation. Review of the patient's consultation with Dr. Radford Pax as inpatient in 10/2018 likewise is without any mention of atrial fibrillation although she was noted to have atrial tachycardia as well as a ventricular arrhythmia. After discharge the patient had been placed on metoprolol which has subsequently been stopped by cardiology due to symptomatic hypotension. Second EKG has been ordered to evaluate for AF, and it demonstrates only normal sinus rhythm. Given the embolic appearance of the CVA and the risk that anticoagulation would represent to this elderly patient, Cardiology has been consulted to evaluate. The patient likely will require loop recorder to evaluate for atrial fibrillation. This has been discussed with cardiology, Dr. Jaynee Eagles, and the patient's daughter [name].  Consultants  . Cardiology . Neurology . EP Cardiology  Procedures  . None  Antibiotics   Anti-infectives (From admission, onward)   None    .  Subjective  The patient is resting comfortably. No new complaints.  Objective   Vitals:  Vitals:   02/29/20 1152 02/29/20 1722  BP: (!) 161/74 111/62  Pulse: 79 87  Resp: 16 (!) 23  Temp: 97.9 F (36.6 C) 97.9 F (36.6 C)  SpO2: 96% 94%   Exam:  Constitutional:   The patient  is awake, alert, and oriented x 3. No acute distress. Respiratory:   No increased work of breathing.  No wheezes, rales, or rhonchi  No tactile fremitus Cardiovascular:   Regular rate and rhythm  No murmurs, ectopy, or gallups.  No lateral PMI. No thrills. Abdomen:   Abdomen is soft, non-tender, non-distended  No hernias, masses, or organomegaly  Normoactive bowel sounds.  Musculoskeletal:   No cyanosis, clubbing, or edema Skin:   No rashes, lesions, ulcers  palpation of skin: no induration or nodules Neurologic:   CN 2-12 intact  Sensation all 4 extremities intact Psychiatric:   Mental status ? Mood, affect appropriate ? Orientation to person, place, time   judgment and insight appear intact   I have personally reviewed the following:   Today's Data  . Vitals, BMP, Lipid profile, CBC  Imaging  . MRI brain  Cardiology Data  . EKG (3/18/20221)  . EKG (02/29/2020)  Scheduled Meds: . aspirin  325 mg Oral Daily  . enoxaparin (LOVENOX) injection  40 mg Subcutaneous Q24H   Continuous Infusions: . sodium chloride 250 mL (02/29/20 0625)  . levETIRAcetam 500 mg (02/29/20 1732)    Principal Problem:   AMS (altered mental status) Active Problems:   CVA (cerebral vascular accident) ([place]) Embolic Stroke Hypertension Hyperlipidemia   LOS: 0 days   A & P  Acute embolic infarct to the right corona radiata: Confirmed on MRI. Neurology has been consulted and the patient was seen with Dr. Jaynee Eagles.  Dr. Jaynee Eagles and Dr. Lucky Rathke have recommended that if the patient is in atrial fibrillation, the patient be placed on Eliquis 2.5 mg bid for anticoagulation with discontinuation of aspirin. EKG on admission does not demonstrate atrial fibrillation. Review of the patient's consultation with Dr. Radford Pax as inpatient in 10/2018 likewise is without any mention of atrial fibrillation although she was noted to have atrial tachycardia as well as a ventricular arrhythmia. After  discharge the patient had been placed on metoprolol which has subsequently been stopped by cardiology due to symptomatic hypotension. Second EKG has been ordered to evaluate for AF, and it demonstrates only normal sinus rhythm. Given the embolic appearance of the CVA and the risk that anticoagulation would represent to this elderly patient, Cardiology has been consulted to evaluate. The patient likely will require loop recorder to evaluate for atrial fibrillation. This has been discussed with cardiology, Dr. Jaynee Eagles, and the patient's daughter [name]. Continue ASA daily for now. She has a history of previous CVA in 12/2019 for which she was treated by her PCP with ASA 81 mg. She was not hospitalized at that time.  Hypertension: Liberal approach to hypertension. Monitor. Pt has largely been normotensive. She has a history of labile blood pressures and hypotension.  Hyperlipidemia: Pt has a stated intolerance to statin therapy.   Depression: continue Lexapro  Ambulatory dysfunction: Pt uses a wheelchair for mobility at baseline.  I have seen and examined this patient myself. I have spent 54 minutes in her evaluation and care.  DVT Prophylaxis: Lovenox CODE STATUS: Partial Family Communication: Daughter [name] present at bedside at time of visit. I have also discussed with her the plan to have recorder placed to evaluate the patient for atrial fibrillation. All questions answered to the best of my ability. Disposition: Plan is for the patient to return to ALF after evaluation by cardiology and plan is  in place for loop recorder placement.  Chamara Dyck, DO [facility] Direct contact: see [email or web address]  7PM-7AM contact night coverage as above 02/29/2020, 6:16 PM  LOS: 0 days

## 2020-02-29 NOTE — Evaluation (Signed)
Speech Language Pathology Evaluation Patient Details Name: Sheila Parrish MRN: [id number] DOB: 1927-06-06 Today's Date: 02/29/2020 Time: [id number] SLP Time Calculation (min) (ACUTE ONLY): 25 min  Problem List:  Patient Active Problem List   Diagnosis Date Noted  . AMS (altered mental status) 02/28/2020  . CVA (cerebral vascular accident) ([place]) 02/28/2020  . Frequent PVCs   . Syncope and collapse 11/08/2018  . Near syncope 10/29/2016  . Orthostatic dizziness 10/29/2016  . Ventricular bigeminy 10/29/2016  . Back soft tissue injury 05/30/2013   Past Medical History:  Past Medical History:  Diagnosis Date  . High cholesterol   . Scarlet fever 1936   Hospitalized for a month   Past Surgical History:  Past Surgical History:  Procedure Laterality Date  . ABDOMINAL HYSTERECTOMY    . TOTAL KNEE ARTHROPLASTY Left 2010   HPI:   Sheila Parrish is a 86 y.o. female with medical history significant of CVA in January 2021, dementia, chronic diastolic congestive heart failure, PVCs, hypertension, hyperlipidemia presents to emergency department with altered mental status. MRI shows Few small foci of acute infarction in the right corona radiata. Chronic infarcts, chronic microvascular ischemic changes, and parenchymal volume loss with progression since 2019. Daughter reports that patient lives in assisted living facility requiring 24-hour help.  While patient was brushing her teeth she became unresponsive with a glazed look on her face, became weak and started shaking.  The episode lasted for 30 minutes.  Patient did not pass out at that time.  Patient had stroke in January 2021 however patient was not hospitalized at that time.  She was started on aspirin 81 mg by her PCP.  Her CT head scan at that time revealed right occipital lobe subacute stroke.   Assessment / Plan / Recommendation Clinical Impression  Pt demonstrates signifcant cognitive impariment in areas of attention at awareness,  particularly to verbal tasks. She also has significant verbal comprehension impairment. She needs max encouragement to respond to Y/N questions, but has no accuracy with responses. She barely initiates speech at all, but when she does, she is typically reading something aloud that she noticed in her visual field. When reading and writing assesed, pt would not read on command, but only spontaneuously with fleeting attention. She did not comprehend or follow commands that she read. When given a marker, she copied the first three letters of the word written onthe page and then was distracted. with max cues was able to induce pt to count aloud to four. Pt also repeated two words. Her verbal and comprehension deficits appear very realted to attention. Will continue SLP efforts to improve functional communication. Need to determine pt baseline. Recommend CIR at d/c as pt was in assist/independent living PTA.     SLP Assessment  SLP Visit Diagnosis: Dysphagia, unspecified (R13.10)    Follow Up Recommendations  Skilled Nursing facility    Frequency and Duration min 2x/week  2 weeks      SLP Evaluation Cognition  Overall Cognitive Status: Impaired/Different from baseline Arousal/Alertness: Awake/alert Orientation Level: Oriented to person Attention: Focused Focused Attention: Impaired Focused Attention Impairment: Verbal basic;Functional basic Awareness: Impaired Awareness Impairment: Emergent impairment Problem Solving: Impaired Problem Solving Impairment: Verbal basic;Functional basic       Comprehension  Auditory Comprehension Overall Auditory Comprehension: Impaired Yes/No Questions: Impaired Basic Biographical Questions: 0-25% accurate Commands: Impaired One Step Basic Commands: 0-24% accurate Interfering Components: Attention Reading Comprehension Reading Status: Impaired(pt reads pharses fluently with no comprehension)    Expression Verbal Expression  Overall Verbal Expression:  Impaired Initiation: Impaired Automatic Speech: Name;Counting Level of Generative/Spontaneous Verbalization: Word Repetition: No impairment Naming: Impairment Responsive: Not tested Confrontation: Impaired Convergent: Not tested Divergent: Not tested Interfering Components: Attention Written Expression Dominant Hand: Right   Oral / Motor  Oral Motor/Sensory Function Overall Oral Motor/Sensory Function: Within functional limits Motor Speech Overall Motor Speech: Appears within functional limits for tasks assessed   GO                   Sheila Baltimore, MA CCC-SLP  Acute Rehabilitation Services Pager [phone] Office [phone]  [place], [place] 02/29/2020, 10:20 AM

## 2020-02-29 NOTE — Evaluation (Signed)
Physical Therapy Evaluation Patient Details Name: Sheila Parrish MRN: [id number] DOB: [birth year] Today's Date: 02/29/2020   History of Present Illness  Sheila Parrish is a 86 y.o. female with medical history significant of CVA in January 2021 (right occipital subacute stroke), dementia, chronic diastolic congestive heart failure, PVCs, hypertension, hyperlipidemia presents to emergency department with altered mental status. MRI shows Few small foci of acute infarction in the right corona radiata.   Clinical Impression  Prior to admission, pt resides at [facility], uses a Rollator for ambulation and has 24 hour supervision and assist from CNA's. On PT evaluation, pt appears close to her functional baseline per daughter report. Pt following one step motor command and able to shake her head yes/no intermittently. Requiring min assist for ambulation 60 feet with a rolling walker (assist mainly for steering walker as she is typically used to using a Rollator). No focal deficits noted. Recommend return to familiar environment and continuation of HHPT/OT upon discharge.    Follow Up Recommendations Home health PT;Supervision/Assistance - 24 hour (active with [facility] 3-5 days/week)    Equipment Recommendations  None recommended by PT    Recommendations for Other Services       Precautions / Restrictions Precautions Precautions: Fall Restrictions Weight Bearing Restrictions: No      Mobility  Bed Mobility Overal bed mobility: Needs Assistance Bed Mobility: Supine to Sit     Supine to sit: Mod assist     General bed mobility comments: ModA to elevate trunk to sitting position  Transfers Overall transfer level: Needs assistance Equipment used: Rolling walker (2 wheeled) Transfers: Sit to/from Stand Sit to Stand: Min assist         General transfer comment: MinA to rise from edge of bed  Ambulation/Gait Ambulation/Gait assistance: Min assist Gait Distance (Feet): 60  Feet Assistive device: Rolling walker (2 wheeled) Gait Pattern/deviations: Step-through pattern;Decreased stride length;Trunk flexed Gait velocity: decreased Gait velocity interpretation: <1.8 ft/sec, indicate of risk for recurrent falls General Gait Details: Pt with decreased proximity to walker, but no gross unsteadiness. requires manual assist for steering walker  Stairs            Wheelchair Mobility    Modified Rankin (Stroke Patients Only) Modified Rankin (Stroke Patients Only) Pre-Morbid Rankin Score: Moderately severe disability Modified Rankin: Moderately severe disability     Balance Overall balance assessment: Needs assistance Sitting-balance support: Feet supported Sitting balance-Leahy Scale: Good     Standing balance support: No upper extremity supported;During functional activity Standing balance-Leahy Scale: Fair Standing balance comment: Washing hands at sink with supervision                             Pertinent Vitals/Pain Pain Assessment: Faces Faces Pain Scale: No hurt    Home Living Family/patient expects to be discharged to:: Private residence Living Arrangements: Alone Available Help at Discharge: Family;Personal care attendant;Available 24 hours/day Type of Home: Independent living facility         Home Equipment: Walker - 4 wheels Additional Comments: Lives at [facility]    Prior Function Level of Independence: Needs assistance   Gait / Transfers Assistance Needed: uses walker  ADL's / Homemaking Assistance Needed: requires assist for ADL's  Comments: Has 24 hour CNA supervision/assist     Hand Dominance   Dominant Hand: Right    Extremity/Trunk Assessment   Upper Extremity Assessment Upper Extremity Assessment: Defer to OT evaluation  Lower Extremity Assessment Lower Extremity Assessment: Overall WFL for tasks assessed       Communication   Communication: Expressive difficulties  Cognition  Arousal/Alertness: Awake/alert Behavior During Therapy: Flat affect Overall Cognitive Status: History of cognitive impairments - at baseline                                 General Comments: Pt with history of dementia and recent stroke. Responds yes/no intermittently. Follows 1 step motor tasks with hand over hand guidance.       General Comments      Exercises     Assessment/Plan    PT Assessment Patient needs continued PT services  PT Problem List Decreased strength;Decreased balance;Decreased mobility;Decreased cognition;Decreased safety awareness       PT Treatment Interventions DME instruction;Gait training;Therapeutic activities;Functional mobility training;Therapeutic exercise;Balance training;Patient/family education    PT Goals (Current goals can be found in the Care Plan section)  Acute Rehab PT Goals Patient Stated Goal: pt daughter would like her to go back to [facility] PT Goal Formulation: With patient/family Time For Goal Achievement: 03/14/20 Potential to Achieve Goals: Fair    Frequency Min 4X/week   Barriers to discharge        Co-evaluation PT/OT/SLP Co-Evaluation/Treatment: Yes Reason for Co-Treatment: For patient/therapist safety;Necessary to address cognition/behavior during functional activity;To address functional/ADL transfers PT goals addressed during session: Mobility/safety with mobility         AM-PAC PT "6 Clicks" Mobility  Outcome Measure Help needed turning from your back to your side while in a flat bed without using bedrails?: A Little Help needed moving from lying on your back to sitting on the side of a flat bed without using bedrails?: A Lot Help needed moving to and from a bed to a chair (including a wheelchair)?: A Little Help needed standing up from a chair using your arms (e.g., wheelchair or bedside chair)?: A Little Help needed to walk in hospital room?: A Little Help needed climbing 3-5 steps with a  railing? : A Lot 6 Click Score: 16    End of Session Equipment Utilized During Treatment: Gait belt Activity Tolerance: Patient tolerated treatment well Patient left: in chair;with call bell/phone within reach;with chair alarm set;with family/visitor present Nurse Communication: Mobility status PT Visit Diagnosis: Unsteadiness on feet (R26.81);Difficulty in walking, not elsewhere classified (R26.2)    Time: [id number] PT Time Calculation (min) (ACUTE ONLY): 29 min   Charges:   PT Evaluation $PT Eval Moderate Complexity: 1 Mod            Wyona Almas, PT, DPT Acute Rehabilitation Services Pager [phone] Office [phone]   Deno Etienne 02/29/2020, 12:51 PM

## 2020-02-29 NOTE — TOC Initial Note (Signed)
Transition of Care [facility]) - Initial/Assessment Note    Patient Details  Name: Sheila Parrish MRN: [id number] Date of Birth: 10/16/1927  Transition of Care Sheila Parrish) CM/SW Contact:    Sheila Parrish Phone Number: [phone] 02/29/2020, 3:24 PM  Clinical Narrative:                  CSW met with pt and daughter Sheila Parrish at bedside. CSW introduced self and explained role. Daughter stated PTA pt [facility]. PT has round the clock care and hone health and therapies in place. Daughter states her plan to return to ALF with her current care in place.   CSW will continue to follow.   Expected Discharge Plan: [place] Barriers to Discharge: Continued Medical Work up   Patient Goals and CMS Choice Patient states their goals for this hospitalization and ongoing recovery are:: unable to participate un goal setting      Expected Discharge Plan and Services Expected Discharge Plan: Sheila Parrish       Living arrangements for the past 2 months: Sheila Parrish                                      Prior Living Arrangements/Services Living arrangements for the past 2 months: Sheila Parrish Lives with:: Facility Resident Patient language and need for interpreter reviewed:: Yes Do you feel safe going back to the place where you live?: Yes      Need for Family Participation in Patient Care: Yes (Comment) Care giver support system in place?: Yes (comment)   Criminal Activity/Legal Involvement Pertinent to Current Situation/Hospitalization: No - Comment as needed  Activities of Daily Living      Permission Sought/Granted Permission sought to share information with : Family Supports Permission granted to share information with : Yes, Verbal Permission Granted  Share Information with NAME: Sheila Parrish  Permission granted to share info w AGENCY: ALF  Permission granted to share info w  Relationship: Daughter  Permission granted to share info w Contact Information: [phone]  Emotional Assessment Appearance:: Appears stated age Attitude/Demeanor/Rapport: Unable to Assess Affect (typically observed): Unable to Assess Orientation: : Oriented to Self      Admission diagnosis:  Unresponsive [R41.89] Seizure-like activity ([place]) [R56.9] AMS (altered mental status) [R41.82] Patient Active Problem List   Diagnosis Date Noted  . AMS (altered mental status) 02/28/2020  . CVA (cerebral vascular accident) ([place]) 02/28/2020  . Frequent PVCs   . Syncope and collapse 11/08/2018  . Near syncope 10/29/2016  . Orthostatic dizziness 10/29/2016  . Ventricular bigeminy 10/29/2016  . Back soft tissue injury 05/30/2013   PCP:  Sheila Orn, MD Pharmacy:   [id number]: [phone]: [phone]    Social Determinants of Health (SDOH) Interventions    Readmission Risk Interventions No flowsheet data found.  MEmeterio Parrish LLatanya Parrish LKellerLicensed CHoliday representative

## 2020-03-01 ENCOUNTER — Inpatient Hospital Stay (HOSPITAL_COMMUNITY): Payer: Medicare PPO

## 2020-03-01 ENCOUNTER — Other Ambulatory Visit: Payer: Self-pay | Admitting: Neurology

## 2020-03-01 DIAGNOSIS — I63511 Cerebral infarction due to unspecified occlusion or stenosis of right middle cerebral artery: Secondary | ICD-10-CM

## 2020-03-01 DIAGNOSIS — R569 Unspecified convulsions: Secondary | ICD-10-CM

## 2020-03-01 DIAGNOSIS — I10 Essential (primary) hypertension: Secondary | ICD-10-CM

## 2020-03-01 LAB — BASIC METABOLIC PANEL
Anion gap: 10 (ref 5–15)
BUN: 10 mg/dL (ref 8–23)
CO2: 23 mmol/L (ref 22–32)
Calcium: 9 mg/dL (ref 8.9–10.3)
Chloride: 108 mmol/L (ref 98–111)
Creatinine, Ser: 0.84 mg/dL (ref 0.44–1.00)
GFR calc Af Amer: >60 mL/min (ref >60)
GFR calc non Af Amer: >60 mL/min (ref >60)
Glucose, Bld: 89 mg/dL (ref 70–99)
Potassium: 3.5 mmol/L (ref 3.5–5.1)
Sodium: 141 mmol/L (ref 135–145)

## 2020-03-01 MED ORDER — ASPIRIN 325 MG PO TABS: 325.0000 mg | ORAL_TABLET | Freq: Every day | ORAL | 0 refills | Status: DC

## 2020-03-01 MED ORDER — PRAVASTATIN SODIUM 40 MG PO TABS: 40.0000 mg | ORAL_TABLET | Freq: Every day | ORAL | Status: DC

## 2020-03-01 MED ORDER — CLOPIDOGREL BISULFATE 75 MG PO TABS
75.0000 mg | ORAL_TABLET | Freq: Every day | ORAL | Status: DC
  Administered 2020-03-01: 75 mg via ORAL
  Filled 2020-03-01: qty 1

## 2020-03-01 MED ORDER — CLOPIDOGREL BISULFATE 75 MG PO TABS: 75.0000 mg | ORAL_TABLET | Freq: Every day | ORAL | 0 refills | Status: DC

## 2020-03-01 NOTE — Plan of Care (Addendum)
Patient was discharged before seen.  By reviewing the chart, patient has history of dementia, hypertension, hyperlipidemia, stroke in 12/2019 subacute right occipital infarct on aspirin.  Admitted for episode of less responsive, glazed looking, and weak all over as well as right-sided shaking.  Episode lasting 30 minutes and resolved.  CT showed old right occipital infarct as well as left basal ganglia old infarcts.  MRI right coronary infarcts.  CTA head and neck showed right P3 occlusion and left P1 occlusion, right MCA stenosis.  EF 65 to 70%.  EEG diffuse slowing, no seizure.  A1c 5.8, LDL 167.  Patient presentation symptoms concerning for seizure activity, however EEG negative at this time.  Do not feel AED needed at this time.  She is following with Dr. Delice Lesch as outpatient.  Patient right CR infarct likely due to right MCA stenosis.  Given her history of a stroke, CTA head and neck showed right PCA occlusion, left P1 occlusion, patient current stroke likely due to large vessel atherosclerosis.  Recommend aspirin 325 and Plavix 75 DAPT for 3 months and then either aspirin or Plavix alone.  Patient has hyperlipidemia, not on statin, apparently intolerance to simvastatin and atorvastatin in the past.  Will put on pravastatin this time.  She will follow up with Dr. Delice Lesch as outpatient.  I do not feel she needs loop recorder at this time given her stroke etiology most likely due to large vessel atherosclerosis.  Also history of dementia makes less benefit of loop recorder.  At this time, she is going to follow-up with Dr. Rayann Heman in 2 days for further decision.  Sheila Hawking, MD PhD Stroke Neurology 03/01/2020 7:52 PM

## 2020-03-01 NOTE — Progress Notes (Signed)
EEG complete - results pending 

## 2020-03-01 NOTE — Progress Notes (Signed)
Discharged after instructions given to daughter and pt.  Transported to vehicle in w/c accompanied by NT Dilva.

## 2020-03-01 NOTE — Progress Notes (Signed)
Pt  Pulls off her IVs,  Mittens were put on both hands, yet she managed to remove the mittens and pull of her IV. Since Pt has  neither IV med nor fluid, we hold off on IV insertion.  Notified NP on call

## 2020-03-01 NOTE — Procedures (Signed)
ELECTROENCEPHALOGRAM REPORT   Patient: Sheila Parrish       Room #: [id number] EEG No. ID: [id number] Age: 86 y.o.        Sex: female Requesting Physician: Swayze Report Date:  03/01/2020        Interpreting Physician: Alexis Goodell  History: ANITZA AMANTE is an 86 y.o. female with altered mental status  Medications:  ASA, Plavix, Pravachol  Conditions of Recording:  This is a 21 channel routine scalp EEG performed with bipolar and monopolar montages arranged in accordance to the international 10/20 system of electrode placement. One channel was dedicated to EKG recording.  The patient is in the awake, drowsy and asleep states.  Description:  The waking background activity is slow and poorly organized.  It consists of low voltage activity in the delta-theta continuum.  The patient does appear to drowse as well when normal drowse is noted.  The patient goes into a light sleep with symmetrical sleep spindles and vertex central sharp transients noted.  There are also noted occasional intermittent periodic discharges of triphasic morphology.  Hyperventilation and intermittent photic stimulation were not performed.   IMPRESSION: This is an abnormal EEG secondary to general background slowing and the presence of triphasic waves.  These findings may be seen with a diffuse disturbance that is etiologically nonspecific, but may include a metabolic encephalopathy, among other possibilities.  No epileptiform activity was noted.     Alexis Goodell, MD Neurology [phone] 03/01/2020, 12:51 PM

## 2020-03-03 ENCOUNTER — Other Ambulatory Visit: Payer: Self-pay

## 2020-03-03 ENCOUNTER — Ambulatory Visit (INDEPENDENT_AMBULATORY_CARE_PROVIDER_SITE_OTHER): Payer: Medicare PPO | Admitting: Internal Medicine

## 2020-03-03 ENCOUNTER — Encounter: Payer: Self-pay | Admitting: Internal Medicine

## 2020-03-03 VITALS — BP 162/80 | HR 89 | Ht 59.0 in | Wt 122.8 lb

## 2020-03-03 DIAGNOSIS — R55 Syncope and collapse: Secondary | ICD-10-CM | POA: Diagnosis not present

## 2020-03-03 DIAGNOSIS — I69398 Other sequelae of cerebral infarction: Secondary | ICD-10-CM | POA: Diagnosis not present

## 2020-03-03 DIAGNOSIS — R278 Other lack of coordination: Secondary | ICD-10-CM | POA: Diagnosis not present

## 2020-03-03 DIAGNOSIS — R488 Other symbolic dysfunctions: Secondary | ICD-10-CM | POA: Diagnosis not present

## 2020-03-03 DIAGNOSIS — I639 Cerebral infarction, unspecified: Secondary | ICD-10-CM

## 2020-03-03 DIAGNOSIS — M6281 Muscle weakness (generalized): Secondary | ICD-10-CM | POA: Diagnosis not present

## 2020-03-03 DIAGNOSIS — R2681 Unsteadiness on feet: Secondary | ICD-10-CM | POA: Diagnosis not present

## 2020-03-03 DIAGNOSIS — I69328 Other speech and language deficits following cerebral infarction: Secondary | ICD-10-CM | POA: Diagnosis not present

## 2020-03-03 HISTORY — PX: OTHER SURGICAL HISTORY: SHX169

## 2020-03-03 NOTE — Progress Notes (Signed)
PCP: Lavone Orn, MD   Primary EP: Dr Beaulah Dinning Sheila Parrish is a 86 y.o. female who presents today for electrophysiology followup.  She was discharged on Friday from [facility] following a stroke.  She was evaluated by Dr Sallyanne Kuster and felt to be a good candidate for ILR to evaluate for afib as the possible cause for her stroke.  She has done reasonably well over the weekend.  She has dementia.  History is provided by her daughter today.  Today, she denies symptoms of palpitations, chest pain, shortness of breath,  lower extremity edema, dizziness, presyncope, or syncope. She did have syncope in 2019 which was abrupt on onset.  Workup was unrevealing at that time. The patient is otherwise without complaint today.   Past Medical History:  Diagnosis Date  . High cholesterol   . Scarlet fever 1936   Hospitalized for a month  . Stroke (cerebrum) ([place])   . Syncope    Past Surgical History:  Procedure Laterality Date  . ABDOMINAL HYSTERECTOMY    . implantable loop recorder placement  03/03/2020   Medtronic Reveal Linq model [place] 11(SN [id number]) implanted for cryptogenic stroke  . TOTAL KNEE ARTHROPLASTY Left 2010    ROS- all systems are reviewed and negatives except as per HPI above  Current Outpatient Medications  Medication Sig Dispense Refill  . aspirin 325 MG tablet Take 1 tablet (325 mg total) by mouth daily. 90 tablet 0  . clopidogrel (PLAVIX) 75 MG tablet Take 1 tablet (75 mg total) by mouth daily. 30 tablet 0  . metoprolol tartrate (LOPRESSOR) 25 MG tablet Take 0.5 tablets (12.5 mg total) by mouth 2 (two) times daily. 30 tablet 0   No current facility-administered medications for this visit.    Physical Exam: Vitals:   03/03/20 0811  BP: (!) 162/80  Pulse: 89  SpO2: 90%  Weight: 122 lb 12.8 oz (55.7 kg)  Height: 4\' 11"  (1.499 m)    GEN- The patient is elderly appearing, alert but confused,  She has dementia Head- normocephalic, atraumatic Eyes-  Sclera clear,  conjunctiva pink Ears- hearing intact Oropharynx- clear Lungs-  normal work of breathing Heart- Regular rate and rhythm  GI- soft, NT, ND, + BS Extremities- no clubbing, cyanosis, or edema  Wt Readings from Last 3 Encounters:  03/03/20 122 lb 12.8 oz (55.7 kg)  02/28/20 114 lb (51.7 kg)  12/19/18 137 lb 12.8 oz (62.5 kg)    EKG tracings in epic reviewed and show sinus rhythm.  Her telemetry during her recent hospitalization showed sinus rhythm also  Assessment and Plan:  1. Cryptogenic stroke She recently had stroke of unknown source.  This is felt to be embolic.  I agree with Dr Sallyanne Kuster that long term monitoring with an ILR is prudent to evaluate for afib as the cause.  She has severe LA enlargement as well as a history of rheumatic fever.  Her risks of afib are quite high.  I would therefore advise implantation of an implantable loop recorder for long term arrhythmia monitoring.  Risks and benefits to ILR were discussed at length with the patient and her daughter today, including but not limited to risks of bleeding and infection.  Extensive device education was performed.  Remote monitoring was also discussed at length today.  The patients daughter Henrietta Dine) understands and wishes to proceed.  We will proceed at this time with ILR implantation.  2. Syncope Remote history of syncope Will follow for AV block or profound brady  arrhythmias on her ILR.   Thompson Grayer MD, [facility] 03/03/2020 4:58 PM      DESCRIPTION OF PROCEDURE:  Informed written consent was obtained.  The patient required no sedation for the procedure today.  The patients left chest was prepped and draped. Mapping over the patient's chest was performed to identify the appropriate ILR site.  This area was found to be the left parasternal region over the 3rd-4th intercostal space.  The skin overlying this region was infiltrated with lidocaine for local analgesia.  A 0.5-cm incision was made at the implant site.  A subcutaneous  ILR pocket was fashioned using a combination of sharp and blunt dissection.  A Medtronic Reveal Linq model LNQ 11(SN [id number]) implantable loop recorder was then placed into the pocket R waves were very prominent and measured > 0.2 mV. EBL<1 ml.  Steri- Strips and a sterile dressing were then applied.  There were no early apparent complications.     CONCLUSIONS:   1. Successful implantation of a Medtronic Reveal LINQ implantable loop recorder for cryptogenic stroke and prior syncope  2. No early apparent complications.   Thompson Grayer MD, [facility] 03/03/2020 4:58 PM

## 2020-03-03 NOTE — Patient Instructions (Addendum)
Medication Instructions:  Your physician recommends that you continue on your current medications as directed. Please refer to the Current Medication list given to you today.  *If you need a refill on your cardiac medications before your next appointment, please call your pharmacy*   Lab Work: None ordered  Testing/Procedures: None ordered   Follow-Up: At [facility], you and your health needs are our priority.  As part of our continuing mission to provide you with exceptional heart care, we have created designated Provider Care Teams.  These Care Teams include your primary Cardiologist (physician) and Advanced Practice Providers (APPs -  Physician Assistants and Nurse Practitioners) who all work together to provide you with the care you need, when you need it.  Your next appointment:   As needed   The format for your next appointment:   In Person  Provider:   Thompson Grayer, MD   Other Instructions  Implantable Loop Recorder Placement  An implantable loop recorder is a small electronic device that is placed under the skin of your chest. It is about the size of an AA ("double A") battery. The device records the electrical activity of your heart over a long period of time. Your health care provider can download these recordings to monitor your heart. You may need an implantable loop recorder if you have periods of abnormal heart activity (arrhythmias) or unexplained fainting (syncope). The recorder can be left in place for 1 year or longer. Tell a health care provider about:  Any allergies you have.  All medicines you are taking, including vitamins, herbs, eye drops, creams, and over-the-counter medicines.  Any problems you or family members have had with anesthetic medicines.  Any blood disorders you have.  Any surgeries you have had.  Any medical conditions you have.  Whether you are pregnant or may be pregnant. What are the risks? Generally, this is a safe  procedure. However, problems may occur, including:  Infection.  Bleeding.  Allergic reactions to anesthetic medicines.  Damage to nerves or blood vessels.  Failure of the device to work. This could require another surgery to replace it. What happens before the procedure?   You may have a physical exam, blood tests, and imaging tests of your heart, such as a chest X-ray.  Follow instructions from your health care provider about eating or drinking restrictions.  Ask your health care provider about: ? Changing or stopping your regular medicines. This is especially important if you are taking diabetes medicines or blood thinners. ? Taking medicines such as aspirin and ibuprofen. These medicines can thin your blood. Do not take these medicines unless your health care provider tells you to take them. ? Taking over-the-counter medicines, vitamins, herbs, and supplements.  Ask your health care provider how your surgical site will be marked or identified.  Ask your health care provider what steps will be taken to help prevent infection. These may include: ? Removing hair at the surgery site. ? Washing skin with a germ-killing soap.  Plan to have someone take you home from the hospital or clinic.  Plan to have a responsible adult care for you for at least 24 hours after you leave the hospital or clinic. This is important.  Do not use any products that contain nicotine or tobacco, such as cigarettes and e-cigarettes. If you need help quitting, ask your health care provider. What happens during the procedure?  An IV will be inserted into one of your veins.  You may be given one  or more of the following: ? A medicine to help you relax (sedative). ? A medicine to numb the area (local anesthetic).  A small incision will be made on the left side of your upper chest.  A pocket will be created under your skin.  The device will be placed in the pocket.  The incision will be closed with  stitches (sutures) or adhesive strips.  A bandage (dressing) will be placed over the incision. The procedure may vary among health care providers and hospitals. What happens after the procedure?  Your blood pressure, heart rate, breathing rate, and blood oxygen level will be monitored until you leave the hospital or clinic.  You may be able to go home on the day of your surgery. Before you go home: ? Your health care provider will program your recorder. ? You will learn how to trigger your device with a handheld activator. ? You will learn how to send recordings to your health care provider. ? You will get an ID card for your device, and you will be told when to use it.  Do not drive for 24 hours if you were given a sedative during your procedure. Summary  An implantable loop recorder is a small electronic device that is placed under the skin of your chest to monitor your heart over a long period of time.  The recorder can be left in place for 1 year or longer.  Plan to have someone take you home from the hospital or clinic. This information is not intended to replace advice given to you by your health care provider. Make sure you discuss any questions you have with your health care provider. Document Revised: 02/02/2018 Document Reviewed: 01/14/2018 Elsevier Patient Education  2020 [employer].

## 2020-03-04 DIAGNOSIS — M6281 Muscle weakness (generalized): Secondary | ICD-10-CM | POA: Diagnosis not present

## 2020-03-04 DIAGNOSIS — I69398 Other sequelae of cerebral infarction: Secondary | ICD-10-CM | POA: Diagnosis not present

## 2020-03-04 DIAGNOSIS — R488 Other symbolic dysfunctions: Secondary | ICD-10-CM | POA: Diagnosis not present

## 2020-03-04 DIAGNOSIS — R278 Other lack of coordination: Secondary | ICD-10-CM | POA: Diagnosis not present

## 2020-03-05 DIAGNOSIS — R278 Other lack of coordination: Secondary | ICD-10-CM | POA: Diagnosis not present

## 2020-03-05 DIAGNOSIS — R488 Other symbolic dysfunctions: Secondary | ICD-10-CM | POA: Diagnosis not present

## 2020-03-05 DIAGNOSIS — R2681 Unsteadiness on feet: Secondary | ICD-10-CM | POA: Diagnosis not present

## 2020-03-05 DIAGNOSIS — I69398 Other sequelae of cerebral infarction: Secondary | ICD-10-CM | POA: Diagnosis not present

## 2020-03-05 DIAGNOSIS — M6281 Muscle weakness (generalized): Secondary | ICD-10-CM | POA: Diagnosis not present

## 2020-03-05 DIAGNOSIS — I69328 Other speech and language deficits following cerebral infarction: Secondary | ICD-10-CM | POA: Diagnosis not present

## 2020-03-06 ENCOUNTER — Telehealth: Payer: Self-pay

## 2020-03-06 DIAGNOSIS — R2681 Unsteadiness on feet: Secondary | ICD-10-CM | POA: Diagnosis not present

## 2020-03-06 DIAGNOSIS — I69328 Other speech and language deficits following cerebral infarction: Secondary | ICD-10-CM | POA: Diagnosis not present

## 2020-03-06 DIAGNOSIS — I69398 Other sequelae of cerebral infarction: Secondary | ICD-10-CM | POA: Diagnosis not present

## 2020-03-06 DIAGNOSIS — M6281 Muscle weakness (generalized): Secondary | ICD-10-CM | POA: Diagnosis not present

## 2020-03-06 NOTE — Telephone Encounter (Signed)
The pt daughter wanted to know if it okay for the pt pcp to just look at the wound? I told her one of our doctor put it in so the nurse will have to look at the wound to make sure everything is okay. I stated we can do a mychart video or take a picture of the wound. The nurse can call to talk about the wound and answer any questions they may have. The scheduler is going to let the pt daughter know and schedule the appointment.

## 2020-03-08 DIAGNOSIS — R2681 Unsteadiness on feet: Secondary | ICD-10-CM | POA: Diagnosis not present

## 2020-03-08 DIAGNOSIS — M6281 Muscle weakness (generalized): Secondary | ICD-10-CM | POA: Diagnosis not present

## 2020-03-08 DIAGNOSIS — I69398 Other sequelae of cerebral infarction: Secondary | ICD-10-CM | POA: Diagnosis not present

## 2020-03-11 DIAGNOSIS — Z8673 Personal history of transient ischemic attack (TIA), and cerebral infarction without residual deficits: Secondary | ICD-10-CM | POA: Diagnosis not present

## 2020-03-12 DIAGNOSIS — I69328 Other speech and language deficits following cerebral infarction: Secondary | ICD-10-CM | POA: Diagnosis not present

## 2020-03-13 DIAGNOSIS — I69398 Other sequelae of cerebral infarction: Secondary | ICD-10-CM | POA: Diagnosis not present

## 2020-03-13 DIAGNOSIS — M6281 Muscle weakness (generalized): Secondary | ICD-10-CM | POA: Diagnosis not present

## 2020-03-13 DIAGNOSIS — R2681 Unsteadiness on feet: Secondary | ICD-10-CM | POA: Diagnosis not present

## 2020-03-14 ENCOUNTER — Telehealth: Payer: Self-pay | Admitting: Cardiology

## 2020-03-14 DIAGNOSIS — I69328 Other speech and language deficits following cerebral infarction: Secondary | ICD-10-CM | POA: Diagnosis not present

## 2020-03-14 DIAGNOSIS — I69398 Other sequelae of cerebral infarction: Secondary | ICD-10-CM | POA: Diagnosis not present

## 2020-03-14 DIAGNOSIS — M6281 Muscle weakness (generalized): Secondary | ICD-10-CM | POA: Diagnosis not present

## 2020-03-14 DIAGNOSIS — R488 Other symbolic dysfunctions: Secondary | ICD-10-CM | POA: Diagnosis not present

## 2020-03-14 DIAGNOSIS — R278 Other lack of coordination: Secondary | ICD-10-CM | POA: Diagnosis not present

## 2020-03-14 NOTE — Telephone Encounter (Signed)
   Pt c/o medication issue:  1. Name of Medication: metoprolol tartrate (LOPRESSOR) 25 MG tablet  2. How are you currently taking this medication (dosage and times per day)? Take 0.5 tablets (12.5 mg total) by mouth 2 (two) times daily.  3. Are you having a reaction (difficulty breathing--STAT)?   4. What is your medication issue? Pt's daughter called she would like to know Dr. Theodosia Blender recommendation abut this meds. When pt was in the hospital to put loop recorder, Dr Rayann Heman he restarted metoprolol for pt. She also wanted to know if Dr. Radford Pax would like to see pt as well  Please call

## 2020-03-14 NOTE — Telephone Encounter (Signed)
Patients daughter states that she is concerned about all the changes in her mother's medications recently. The patient was started back on metoprolol after she was in the hospital 03/18. The patient was taken off of metoprolol in 2019 because it was thought that it was causing her fainting spells. She states that her mother has been very sleepy since being discharged from the hospital and taking more naps than usual. She has not had any lightheadedness or fainting spells. Patient had a recent visit with Dr. Rayann Heman and a loop recorder was placed. I advised the daughter that the patient should continue on current medication plan. The daughter verbalized understanding and just wanted Dr. Radford Pax to review all her recent notes and testing to make sure she was on the right plan.

## 2020-03-15 NOTE — Telephone Encounter (Signed)
It appears that the metoprolol was inadventantly put back on her discharge summary but she never was prescribed this in the hospital.  Please have her stop the metoprolol and followup with Dr. Laurann Montana

## 2020-03-16 NOTE — ED Provider Notes (Signed)
.  Critical Care Performed by: Domenic Moras, PA-C Authorized by: Domenic Moras, PA-C   Critical care provider statement:    Critical care time (minutes):  30   Critical care was time spent personally by me on the following activities:  Discussions with consultants, evaluation of patient's response to treatment, examination of patient, ordering and performing treatments and interventions, ordering and review of laboratory studies, ordering and review of radiographic studies, pulse oximetry, re-evaluation of patient's condition, obtaining history from patient or surrogate and review of old charts   CC care time for 02/28/2020 visit added.      Domenic Moras, PA-C 03/16/20 1524    Pattricia Boss, MD 03/18/20 [phone]

## 2020-03-17 DIAGNOSIS — M6281 Muscle weakness (generalized): Secondary | ICD-10-CM | POA: Diagnosis not present

## 2020-03-17 DIAGNOSIS — R488 Other symbolic dysfunctions: Secondary | ICD-10-CM | POA: Diagnosis not present

## 2020-03-17 DIAGNOSIS — I69398 Other sequelae of cerebral infarction: Secondary | ICD-10-CM | POA: Diagnosis not present

## 2020-03-17 DIAGNOSIS — R278 Other lack of coordination: Secondary | ICD-10-CM | POA: Diagnosis not present

## 2020-03-17 DIAGNOSIS — R2681 Unsteadiness on feet: Secondary | ICD-10-CM | POA: Diagnosis not present

## 2020-03-17 NOTE — Telephone Encounter (Signed)
Spoke with patient's daughter and informed her that the patient should discontinue taking metoprolol. She verbalized understanding and thanked me for the call.

## 2020-03-18 DIAGNOSIS — R488 Other symbolic dysfunctions: Secondary | ICD-10-CM | POA: Diagnosis not present

## 2020-03-18 DIAGNOSIS — I69398 Other sequelae of cerebral infarction: Secondary | ICD-10-CM | POA: Diagnosis not present

## 2020-03-18 DIAGNOSIS — M6281 Muscle weakness (generalized): Secondary | ICD-10-CM | POA: Diagnosis not present

## 2020-03-18 DIAGNOSIS — R278 Other lack of coordination: Secondary | ICD-10-CM | POA: Diagnosis not present

## 2020-03-19 DIAGNOSIS — M6281 Muscle weakness (generalized): Secondary | ICD-10-CM | POA: Diagnosis not present

## 2020-03-19 DIAGNOSIS — I69328 Other speech and language deficits following cerebral infarction: Secondary | ICD-10-CM | POA: Diagnosis not present

## 2020-03-19 DIAGNOSIS — I69398 Other sequelae of cerebral infarction: Secondary | ICD-10-CM | POA: Diagnosis not present

## 2020-03-19 DIAGNOSIS — R278 Other lack of coordination: Secondary | ICD-10-CM | POA: Diagnosis not present

## 2020-03-19 DIAGNOSIS — R488 Other symbolic dysfunctions: Secondary | ICD-10-CM | POA: Diagnosis not present

## 2020-03-19 DIAGNOSIS — Z20828 Contact with and (suspected) exposure to other viral communicable diseases: Secondary | ICD-10-CM | POA: Diagnosis not present

## 2020-03-19 DIAGNOSIS — Z1159 Encounter for screening for other viral diseases: Secondary | ICD-10-CM | POA: Diagnosis not present

## 2020-03-20 ENCOUNTER — Telehealth (INDEPENDENT_AMBULATORY_CARE_PROVIDER_SITE_OTHER): Payer: Medicare PPO | Admitting: *Deleted

## 2020-03-20 ENCOUNTER — Other Ambulatory Visit: Payer: Self-pay

## 2020-03-20 DIAGNOSIS — R2681 Unsteadiness on feet: Secondary | ICD-10-CM | POA: Diagnosis not present

## 2020-03-20 DIAGNOSIS — M6281 Muscle weakness (generalized): Secondary | ICD-10-CM | POA: Diagnosis not present

## 2020-03-20 DIAGNOSIS — I69398 Other sequelae of cerebral infarction: Secondary | ICD-10-CM | POA: Diagnosis not present

## 2020-03-20 DIAGNOSIS — I69328 Other speech and language deficits following cerebral infarction: Secondary | ICD-10-CM | POA: Diagnosis not present

## 2020-03-20 DIAGNOSIS — R488 Other symbolic dysfunctions: Secondary | ICD-10-CM | POA: Diagnosis not present

## 2020-03-20 DIAGNOSIS — R278 Other lack of coordination: Secondary | ICD-10-CM | POA: Diagnosis not present

## 2020-03-20 DIAGNOSIS — R55 Syncope and collapse: Secondary | ICD-10-CM

## 2020-03-21 DIAGNOSIS — I69398 Other sequelae of cerebral infarction: Secondary | ICD-10-CM | POA: Diagnosis not present

## 2020-03-21 DIAGNOSIS — R2681 Unsteadiness on feet: Secondary | ICD-10-CM | POA: Diagnosis not present

## 2020-03-21 DIAGNOSIS — I69328 Other speech and language deficits following cerebral infarction: Secondary | ICD-10-CM | POA: Diagnosis not present

## 2020-03-21 DIAGNOSIS — R278 Other lack of coordination: Secondary | ICD-10-CM | POA: Diagnosis not present

## 2020-03-21 DIAGNOSIS — R488 Other symbolic dysfunctions: Secondary | ICD-10-CM | POA: Diagnosis not present

## 2020-03-21 DIAGNOSIS — M6281 Muscle weakness (generalized): Secondary | ICD-10-CM | POA: Diagnosis not present

## 2020-03-21 LAB — CUP PACEART INCLINIC DEVICE CHECK
Date Time Interrogation Session: 20210408163522
Implantable Pulse Generator Implant Date: 20210322

## 2020-03-21 NOTE — Progress Notes (Signed)
ILR wound check virtual visit. Patient assisted with daughter for visit, verbal consent given with verification or patients name and DOB. Steri strips removed prior to virtual visit. Wound well healed. Does not appear to have any redness, drainage or swelling. Home monitor transmitting nightly. No episodes. Questions answered.

## 2020-03-24 DIAGNOSIS — M6281 Muscle weakness (generalized): Secondary | ICD-10-CM | POA: Diagnosis not present

## 2020-03-24 DIAGNOSIS — R488 Other symbolic dysfunctions: Secondary | ICD-10-CM | POA: Diagnosis not present

## 2020-03-24 DIAGNOSIS — R278 Other lack of coordination: Secondary | ICD-10-CM | POA: Diagnosis not present

## 2020-03-24 DIAGNOSIS — I69398 Other sequelae of cerebral infarction: Secondary | ICD-10-CM | POA: Diagnosis not present

## 2020-03-25 DIAGNOSIS — I69328 Other speech and language deficits following cerebral infarction: Secondary | ICD-10-CM | POA: Diagnosis not present

## 2020-03-26 DIAGNOSIS — R2681 Unsteadiness on feet: Secondary | ICD-10-CM | POA: Diagnosis not present

## 2020-03-26 DIAGNOSIS — I69328 Other speech and language deficits following cerebral infarction: Secondary | ICD-10-CM | POA: Diagnosis not present

## 2020-03-26 DIAGNOSIS — M6281 Muscle weakness (generalized): Secondary | ICD-10-CM | POA: Diagnosis not present

## 2020-03-26 DIAGNOSIS — R488 Other symbolic dysfunctions: Secondary | ICD-10-CM | POA: Diagnosis not present

## 2020-03-26 DIAGNOSIS — Z1159 Encounter for screening for other viral diseases: Secondary | ICD-10-CM | POA: Diagnosis not present

## 2020-03-26 DIAGNOSIS — Z20828 Contact with and (suspected) exposure to other viral communicable diseases: Secondary | ICD-10-CM | POA: Diagnosis not present

## 2020-03-26 DIAGNOSIS — I69398 Other sequelae of cerebral infarction: Secondary | ICD-10-CM | POA: Diagnosis not present

## 2020-03-26 DIAGNOSIS — R278 Other lack of coordination: Secondary | ICD-10-CM | POA: Diagnosis not present

## 2020-03-27 DIAGNOSIS — R278 Other lack of coordination: Secondary | ICD-10-CM | POA: Diagnosis not present

## 2020-03-27 DIAGNOSIS — I69398 Other sequelae of cerebral infarction: Secondary | ICD-10-CM | POA: Diagnosis not present

## 2020-03-27 DIAGNOSIS — R488 Other symbolic dysfunctions: Secondary | ICD-10-CM | POA: Diagnosis not present

## 2020-03-27 DIAGNOSIS — R2681 Unsteadiness on feet: Secondary | ICD-10-CM | POA: Diagnosis not present

## 2020-03-27 DIAGNOSIS — M6281 Muscle weakness (generalized): Secondary | ICD-10-CM | POA: Diagnosis not present

## 2020-04-01 DIAGNOSIS — I69328 Other speech and language deficits following cerebral infarction: Secondary | ICD-10-CM | POA: Diagnosis not present

## 2020-04-02 ENCOUNTER — Other Ambulatory Visit: Payer: Self-pay

## 2020-04-02 DIAGNOSIS — Z20828 Contact with and (suspected) exposure to other viral communicable diseases: Secondary | ICD-10-CM | POA: Diagnosis not present

## 2020-04-02 DIAGNOSIS — Z1159 Encounter for screening for other viral diseases: Secondary | ICD-10-CM | POA: Diagnosis not present

## 2020-04-02 MED ORDER — CLOPIDOGREL BISULFATE 75 MG PO TABS: 75.0000 mg | ORAL_TABLET | Freq: Every day | ORAL | 11 refills | Status: DC

## 2020-04-02 NOTE — Telephone Encounter (Signed)
Pt's medication was sent to pt's pharmacy a requested. Confirmation received.  

## 2020-04-03 ENCOUNTER — Ambulatory Visit (INDEPENDENT_AMBULATORY_CARE_PROVIDER_SITE_OTHER): Payer: Medicare PPO | Admitting: *Deleted

## 2020-04-03 DIAGNOSIS — R55 Syncope and collapse: Secondary | ICD-10-CM

## 2020-04-03 DIAGNOSIS — I69328 Other speech and language deficits following cerebral infarction: Secondary | ICD-10-CM | POA: Diagnosis not present

## 2020-04-03 LAB — CUP PACEART REMOTE DEVICE CHECK
Date Time Interrogation Session: 20210422075659
Implantable Pulse Generator Implant Date: 20210322

## 2020-04-04 DIAGNOSIS — I69328 Other speech and language deficits following cerebral infarction: Secondary | ICD-10-CM | POA: Diagnosis not present

## 2020-04-04 NOTE — Progress Notes (Signed)
ILR Remote 

## 2020-04-07 DIAGNOSIS — I69328 Other speech and language deficits following cerebral infarction: Secondary | ICD-10-CM | POA: Diagnosis not present

## 2020-04-09 DIAGNOSIS — Z20828 Contact with and (suspected) exposure to other viral communicable diseases: Secondary | ICD-10-CM | POA: Diagnosis not present

## 2020-04-09 DIAGNOSIS — Z1159 Encounter for screening for other viral diseases: Secondary | ICD-10-CM | POA: Diagnosis not present

## 2020-04-13 ENCOUNTER — Encounter (HOSPITAL_COMMUNITY): Payer: Self-pay | Admitting: Student

## 2020-04-13 ENCOUNTER — Emergency Department (HOSPITAL_COMMUNITY): Payer: Medicare PPO

## 2020-04-13 ENCOUNTER — Emergency Department (HOSPITAL_COMMUNITY)
Admission: EM | Admit: 2020-04-13 | Discharge: 2020-04-13 | Disposition: A | Discharge status: Home / Self Care | Payer: Medicare PPO | Attending: Emergency Medicine | Admitting: Emergency Medicine

## 2020-04-13 DIAGNOSIS — Z7982 Long term (current) use of aspirin: Secondary | ICD-10-CM | POA: Diagnosis not present

## 2020-04-13 DIAGNOSIS — I959 Hypotension, unspecified: Secondary | ICD-10-CM | POA: Diagnosis not present

## 2020-04-13 DIAGNOSIS — Y939 Activity, unspecified: Secondary | ICD-10-CM | POA: Insufficient documentation

## 2020-04-13 DIAGNOSIS — R0902 Hypoxemia: Secondary | ICD-10-CM | POA: Diagnosis not present

## 2020-04-13 DIAGNOSIS — S0990XA Unspecified injury of head, initial encounter: Secondary | ICD-10-CM | POA: Diagnosis not present

## 2020-04-13 DIAGNOSIS — S299XXA Unspecified injury of thorax, initial encounter: Secondary | ICD-10-CM | POA: Diagnosis not present

## 2020-04-13 DIAGNOSIS — R279 Unspecified lack of coordination: Secondary | ICD-10-CM | POA: Diagnosis not present

## 2020-04-13 DIAGNOSIS — Z7902 Long term (current) use of antithrombotics/antiplatelets: Secondary | ICD-10-CM | POA: Diagnosis not present

## 2020-04-13 DIAGNOSIS — S0083XA Contusion of other part of head, initial encounter: Secondary | ICD-10-CM | POA: Diagnosis not present

## 2020-04-13 DIAGNOSIS — M25562 Pain in left knee: Secondary | ICD-10-CM | POA: Diagnosis not present

## 2020-04-13 DIAGNOSIS — Z96642 Presence of left artificial hip joint: Secondary | ICD-10-CM | POA: Insufficient documentation

## 2020-04-13 DIAGNOSIS — S3991XA Unspecified injury of abdomen, initial encounter: Secondary | ICD-10-CM | POA: Diagnosis not present

## 2020-04-13 DIAGNOSIS — M25552 Pain in left hip: Secondary | ICD-10-CM | POA: Diagnosis not present

## 2020-04-13 DIAGNOSIS — N3000 Acute cystitis without hematuria: Secondary | ICD-10-CM | POA: Diagnosis not present

## 2020-04-13 DIAGNOSIS — S79912A Unspecified injury of left hip, initial encounter: Secondary | ICD-10-CM | POA: Diagnosis not present

## 2020-04-13 DIAGNOSIS — R5381 Other malaise: Secondary | ICD-10-CM | POA: Diagnosis not present

## 2020-04-13 DIAGNOSIS — S79911A Unspecified injury of right hip, initial encounter: Secondary | ICD-10-CM | POA: Diagnosis not present

## 2020-04-13 DIAGNOSIS — Y999 Unspecified external cause status: Secondary | ICD-10-CM | POA: Insufficient documentation

## 2020-04-13 DIAGNOSIS — Y92129 Unspecified place in nursing home as the place of occurrence of the external cause: Secondary | ICD-10-CM | POA: Insufficient documentation

## 2020-04-13 DIAGNOSIS — M25561 Pain in right knee: Secondary | ICD-10-CM | POA: Diagnosis not present

## 2020-04-13 DIAGNOSIS — F0390 Unspecified dementia without behavioral disturbance: Secondary | ICD-10-CM | POA: Diagnosis not present

## 2020-04-13 DIAGNOSIS — W19XXXA Unspecified fall, initial encounter: Secondary | ICD-10-CM | POA: Diagnosis not present

## 2020-04-13 DIAGNOSIS — W050XXA Fall from non-moving wheelchair, initial encounter: Secondary | ICD-10-CM | POA: Diagnosis not present

## 2020-04-13 DIAGNOSIS — Z8673 Personal history of transient ischemic attack (TIA), and cerebral infarction without residual deficits: Secondary | ICD-10-CM | POA: Insufficient documentation

## 2020-04-13 DIAGNOSIS — Z743 Need for continuous supervision: Secondary | ICD-10-CM | POA: Diagnosis not present

## 2020-04-13 DIAGNOSIS — S0993XA Unspecified injury of face, initial encounter: Secondary | ICD-10-CM | POA: Diagnosis not present

## 2020-04-13 DIAGNOSIS — S199XXA Unspecified injury of neck, initial encounter: Secondary | ICD-10-CM | POA: Diagnosis not present

## 2020-04-13 DIAGNOSIS — S8991XA Unspecified injury of right lower leg, initial encounter: Secondary | ICD-10-CM | POA: Diagnosis not present

## 2020-04-13 DIAGNOSIS — R9431 Abnormal electrocardiogram [ECG] [EKG]: Secondary | ICD-10-CM | POA: Diagnosis not present

## 2020-04-13 DIAGNOSIS — M25551 Pain in right hip: Secondary | ICD-10-CM | POA: Diagnosis not present

## 2020-04-13 LAB — COMPREHENSIVE METABOLIC PANEL
ALT: 13 U/L (ref 0–44)
AST: 20 U/L (ref 15–41)
Albumin: 3.9 g/dL (ref 3.5–5.0)
Alkaline Phosphatase: 61 U/L (ref 38–126)
Anion gap: 8 (ref 5–15)
BUN: 17 mg/dL (ref 8–23)
CO2: 27 mmol/L (ref 22–32)
Calcium: 9.5 mg/dL (ref 8.9–10.3)
Chloride: 105 mmol/L (ref 98–111)
Creatinine, Ser: 0.75 mg/dL (ref 0.44–1.00)
GFR calc Af Amer: >60 mL/min (ref >60)
GFR calc non Af Amer: >60 mL/min (ref >60)
Glucose, Bld: 126 mg/dL — ABNORMAL HIGH (ref 70–99)
Potassium: 4.1 mmol/L (ref 3.5–5.1)
Sodium: 140 mmol/L (ref 135–145)
Total Bilirubin: 0.8 mg/dL (ref 0.3–1.2)
Total Protein: 6.8 g/dL (ref 6.5–8.1)

## 2020-04-13 LAB — SAMPLE TO BLOOD BANK

## 2020-04-13 LAB — I-STAT CHEM 8, ED
BUN: 16 mg/dL (ref 8–23)
Calcium, Ion: 1.24 mmol/L (ref 1.15–1.40)
Chloride: 103 mmol/L (ref 98–111)
Creatinine, Ser: 0.8 mg/dL (ref 0.44–1.00)
Glucose, Bld: 123 mg/dL — ABNORMAL HIGH (ref 70–99)
HCT: 41 % (ref 36.0–46.0)
Hemoglobin: 13.9 g/dL (ref 12.0–15.0)
Potassium: 4.2 mmol/L (ref 3.5–5.1)
Sodium: 140 mmol/L (ref 135–145)
TCO2: 28 mmol/L (ref 22–32)

## 2020-04-13 LAB — URINALYSIS, ROUTINE W REFLEX MICROSCOPIC
Bilirubin Urine: NEGATIVE
Glucose, UA: NEGATIVE mg/dL
Hgb urine dipstick: NEGATIVE
Ketones, ur: 5 mg/dL — AB
Leukocytes,Ua: NEGATIVE
Nitrite: POSITIVE — AB
Protein, ur: NEGATIVE mg/dL
Specific Gravity, Urine: 1.019 (ref 1.005–1.030)
pH: 6 (ref 5.0–8.0)

## 2020-04-13 LAB — CBC
HCT: 43.1 % (ref 36.0–46.0)
Hemoglobin: 13.9 g/dL (ref 12.0–15.0)
MCH: 30.6 pg (ref 26.0–34.0)
MCHC: 32.3 g/dL (ref 30.0–36.0)
MCV: 94.9 fL (ref 80.0–100.0)
Platelets: 299 10*3/uL (ref 150–400)
RBC: 4.54 MIL/uL (ref 3.87–5.11)
RDW: 13.6 % (ref 11.5–15.5)
WBC: 7.6 10*3/uL (ref 4.0–10.5)
nRBC: 0 % (ref 0.0–0.2)

## 2020-04-13 LAB — PROTIME-INR
INR: 1 (ref 0.8–1.2)
Prothrombin Time: 12.6 seconds (ref 11.4–15.2)

## 2020-04-13 LAB — LACTIC ACID, PLASMA: Lactic Acid, Venous: 1.6 mmol/L (ref 0.5–1.9)

## 2020-04-13 MED ORDER — IOHEXOL 300 MG/ML  SOLN: 100.0000 mL | Freq: Once | INTRAMUSCULAR | Status: DC | PRN

## 2020-04-13 MED ORDER — SODIUM CHLORIDE 0.9 % IV SOLN
1.0000 g | Freq: Once | INTRAVENOUS | Status: AC
  Administered 2020-04-13: 1 g via INTRAVENOUS
  Filled 2020-04-13: qty 10

## 2020-04-13 MED ORDER — IOHEXOL 300 MG/ML  SOLN
75.0000 mL | Freq: Once | INTRAMUSCULAR | Status: AC | PRN
  Administered 2020-04-13: 17:00:00 75 mL via INTRAVENOUS

## 2020-04-13 MED ORDER — CEPHALEXIN 500 MG PO CAPS: 500.0000 mg | ORAL_CAPSULE | Freq: Two times a day (BID) | ORAL | 0 refills | Status: AC

## 2020-04-13 MED ORDER — SODIUM CHLORIDE (PF) 0.9 % IJ SOLN
INTRAMUSCULAR | Status: AC
  Filled 2020-04-13: qty 50

## 2020-04-13 NOTE — ED Triage Notes (Signed)
Pt from [facility] via EMS after a witnessed ground level fall. Pt caregiver reports that pt feel out of a chair while eating dinner. Pt did not have LOC or hit head. Pt is at her baseline per caregiver. Pt takes Plavix. Caregiver adds that pt has not been eating/drinking much today.

## 2020-04-13 NOTE — ED Notes (Signed)
Pt. Documented in error see above note in chart. 

## 2020-04-13 NOTE — ED Notes (Signed)
Daughter at bedside.

## 2020-04-13 NOTE — Discharge Instructions (Signed)
Contact a health care provider if:  Your symptoms do not get better after 1-2 days.  Your symptoms go away and then return.  Get help right away if you have:  Severe pain in your back or your lower abdomen.  A fever.  Nausea or vomiting.

## 2020-04-13 NOTE — ED Notes (Signed)
Patient transported to CT 

## 2020-04-13 NOTE — ED Notes (Signed)
Ambulated pt to bathroom could not void order for in&out cath. Pt daughter stated before doing in&out cath that her mother would need something for pain: because she's dry an it's going to hurt.

## 2020-04-13 NOTE — ED Notes (Signed)
Discharge instructions given to daughter daughter verbalized understanding and denies questions at this time.

## 2020-04-13 NOTE — ED Notes (Signed)
[facility] called for transport.  

## 2020-04-13 NOTE — ED Provider Notes (Signed)
[facility] DEPT Provider Note   CSN: [id number] Arrival date & time: 04/13/20  1415     History Chief Complaint  Patient presents with  . Fall    Sheila Parrish is a 86 y.o. female who presents via EMS for mechanical fall.  She has a history of dementia, recent embolic stroke, multiple admissions for syncope and collapse.  Patient's daughter is at bedside gives a history.  She was apparently being attended by caregiver who states that the patient reached down to the floor and then fell out of her wheelchair.  She does not believe that she lost consciousness.  She has bruising to the face.  Daughter states that she is very lethargic and noncommunicative normally she is able to walk with her walker and talks but is confused at baseline.  She is on clopidogrel.  HPI     Past Medical History:  Diagnosis Date  . High cholesterol   . Scarlet fever 1936   Hospitalized for a month  . Stroke (cerebrum) ([place])   . Syncope     Patient Active Problem List   Diagnosis Date Noted  . AMS (altered mental status) 02/28/2020  . CVA (cerebral vascular accident) ([place]) 02/28/2020  . Frequent PVCs   . Syncope and collapse 11/08/2018  . Near syncope 10/29/2016  . Orthostatic dizziness 10/29/2016  . Ventricular bigeminy 10/29/2016  . Back soft tissue injury 05/30/2013    Past Surgical History:  Procedure Laterality Date  . ABDOMINAL HYSTERECTOMY    . implantable loop recorder placement  03/03/2020   Medtronic Reveal Linq model [place] 11(SN [id number]) implanted for cryptogenic stroke  . TOTAL KNEE ARTHROPLASTY Left 2010     OB History   No obstetric history on file.     Family History  Problem Relation Age of Onset  . Arthritis Mother        Died at 64  . Stroke Father 101       Died at age 30  . CAD Neg Hx     Social History   Tobacco Use  . Smoking status: Never Smoker  . Smokeless tobacco: Never Used  Substance Use Topics  . Alcohol use: No  .  Drug use: No    Home Medications Prior to Admission medications   Medication Sig Start Date End Date Taking? Authorizing Provider  aspirin 325 MG tablet Take 1 tablet (325 mg total) by mouth daily. 03/02/20   Swayze, Ava, DO  clopidogrel (PLAVIX) 75 MG tablet Take 1 tablet (75 mg total) by mouth daily. 04/02/20   Allred, Jeneen Rinks, MD    Allergies    Donepezil, Alendronate, Atorvastatin, Bactrim [sulfamethoxazole-trimethoprim], Macrodantin [nitrofurantoin macrocrystal], Simvastatin, Sulfa antibiotics, and Azithromycin  Review of Systems   Review of Systems Ten systems reviewed and are negative for acute change, except as noted in the HPI.   Physical Exam Updated Vital Signs BP 112/60 (BP Location: Left Arm)   Pulse 72   Temp (!) 97.3 F (36.3 C) (Oral)   Resp 17   SpO2 100%   Physical Exam Vitals and nursing note reviewed.  Constitutional:      General: She is not in acute distress.    Appearance: She is well-developed. She is not diaphoretic.  HENT:     Head: Normocephalic.   Eyes:     General: No scleral icterus.    Conjunctiva/sclera: Conjunctivae normal.  Cardiovascular:     Rate and Rhythm: Normal rate and regular rhythm.  Heart sounds: Normal heart sounds. No murmur. No friction rub. No gallop.   Pulmonary:     Effort: Pulmonary effort is normal. No respiratory distress.     Breath sounds: Normal breath sounds.  Abdominal:     General: Bowel sounds are normal. There is distension.     Palpations: Abdomen is soft. There is no mass.     Tenderness: There is abdominal tenderness. There is no guarding.    Musculoskeletal:     Cervical back: No tenderness.     Comments: Patient flinching with mobilization of the left hip and knee, unable to isolate because of pain. No midline spinal tenderness, pelvis is stable to applied pressure  Skin:    General: Skin is warm and dry.  Neurological:     Mental Status: She is alert.     GCS: GCS eye subscore is 4. GCS verbal  subscore is 1. GCS motor subscore is 5.  Psychiatric:        Behavior: Behavior normal.     ED Results / Procedures / Treatments   Labs (all labs ordered are listed, but only abnormal results are displayed) Labs Reviewed  COMPREHENSIVE METABOLIC PANEL  CBC  URINALYSIS, ROUTINE W REFLEX MICROSCOPIC  LACTIC ACID, PLASMA  PROTIME-INR  I-STAT CHEM 8, ED  SAMPLE TO BLOOD BANK    EKG None  Radiology No results found.  Procedures Procedures (including critical care time)  Medications Ordered in ED Medications - No data to display  ED Course  I have reviewed the triage vital signs and the nursing notes.  Pertinent labs & imaging results that were available during my care of the patient were reviewed by me and considered in my medical decision making (see chart for details).    MDM Rules/Calculators/A&P                      CC: Fall/altered mental status VS:  Vitals:   04/13/20 2130 04/13/20 2145 04/13/20 2230 04/13/20 2245  BP: 113/69 (!) 124/57 120/87 126/71  Pulse: 80 77 77 84  Resp: 13 14  16   Temp:      TempSrc:      SpO2: 100% 98% 100% 99%    [id number] is gathered by daughter at bedside and EMR. Previous records obtained and reviewed. DDX:The patient's complaint of altered mental status involves an extensive number of diagnostic and treatment options, and is a complaint that carries with it a high risk of complications, morbidity, and potential mortality. Given the large differential diagnosis, medical decision making is of high complexity. The differential diagnosis for AMS is extensive and includes, but is not limited to: drug overdose - opioids, alcohol, sedatives, antipsychotics, drug withdrawal, others; Metabolic: hypoxia, hypoglycemia, hyperglycemia, hypercalcemia, hypernatremia, hyponatremia, uremia, hepatic encephalopathy, hypothyroidism, hyperthyroidism, vitamin B12 or thiamine deficiency, carbon monoxide poisoning, Wilson's disease, Lactic acidosis,  DKA/HHOS; Infectious: meningitis, encephalitis, bacteremia/sepsis, urinary tract infection, pneumonia, neurosyphilis; Structural: Space-occupying lesion, (brain tumor, subdural hematoma, hydrocephalus,); Vascular: stroke, subarachnoid hemorrhage, coronary ischemia, hypertensive encephalopathy, CNS vasculitis, thrombotic thrombocytopenic purpura, disseminated intravascular coagulation, hyperviscosity; Psychiatric: Schizophrenia, depression; Other: Seizure, hypothermia, heat stroke, ICU psychosis, dementia -"sundowning."  Labs: I ordered reviewed and interpreted labs which include CBC which shows no acute abnormality , PT/INR within normal limits, normal lactic acid.  CMP shows mildly elevated blood glucose of insignificant value.  Catheterized urine appears infected. Imaging: I ordered and reviewed images which included plain films of the knee hips and pelvis and chest along with CT scans of  the C-spine, head, maxillofacial and CT abdomen pelvis and chest with contrast. I independently visualized and interpreted all imaging. There are no acute, significant findings on today's images. EKG: Normal sinus rhythm at a rate of 75 Consults: None MDM: Patient here with mechanical fall of her wheelchair.  She has no evidence of acute fracture or other abnormality.  EKG shows no arrhythmias.  Not believe she lost consciousness.  Her mental status has improved.  CT imaging did show some thickening of the bladder and urine is positive for infection will treat here with IV Rocephin and discharged with Keflex.  Daughter updated on all findings and agrees with work-up and plan for discharge. Patient disposition: Discharge The patient appears reasonably screened and/or stabilized for discharge and I doubt any other medical condition or other [facility] requiring further screening, evaluation, or treatment in the ED at this time prior to discharge. I have discussed lab and/or imaging findings with the patient and answered all  questions/concerns to the best of my ability.I have discussed return precautions and OP follow up.    Final Clinical Impression(s) / ED Diagnoses Final diagnoses:  None    Rx / DC Orders ED Discharge Orders    None       Margarita Mail, PA-C 04/14/20 [place], [place], DO 04/14/20 2321

## 2020-04-16 DIAGNOSIS — Z20828 Contact with and (suspected) exposure to other viral communicable diseases: Secondary | ICD-10-CM | POA: Diagnosis not present

## 2020-04-16 DIAGNOSIS — Z1159 Encounter for screening for other viral diseases: Secondary | ICD-10-CM | POA: Diagnosis not present

## 2020-04-23 DIAGNOSIS — Z1159 Encounter for screening for other viral diseases: Secondary | ICD-10-CM | POA: Diagnosis not present

## 2020-04-23 DIAGNOSIS — Z20828 Contact with and (suspected) exposure to other viral communicable diseases: Secondary | ICD-10-CM | POA: Diagnosis not present

## 2020-04-29 DIAGNOSIS — Z85828 Personal history of other malignant neoplasm of skin: Secondary | ICD-10-CM | POA: Diagnosis not present

## 2020-04-29 DIAGNOSIS — D485 Neoplasm of uncertain behavior of skin: Secondary | ICD-10-CM | POA: Diagnosis not present

## 2020-04-30 ENCOUNTER — Other Ambulatory Visit: Payer: Self-pay | Admitting: Internal Medicine

## 2020-04-30 DIAGNOSIS — Z1159 Encounter for screening for other viral diseases: Secondary | ICD-10-CM | POA: Diagnosis not present

## 2020-04-30 DIAGNOSIS — Z20828 Contact with and (suspected) exposure to other viral communicable diseases: Secondary | ICD-10-CM | POA: Diagnosis not present

## 2020-04-30 DIAGNOSIS — R2241 Localized swelling, mass and lump, right lower limb: Secondary | ICD-10-CM

## 2020-05-02 ENCOUNTER — Emergency Department (HOSPITAL_COMMUNITY): Payer: Medicare PPO

## 2020-05-02 ENCOUNTER — Observation Stay (HOSPITAL_COMMUNITY)
Admission: EM | Admit: 2020-05-02 | Discharge: 2020-05-04 | Disposition: A | Discharge status: Home / Self Care | Payer: Medicare PPO | Attending: Internal Medicine | Admitting: Internal Medicine

## 2020-05-02 ENCOUNTER — Encounter (HOSPITAL_COMMUNITY): Payer: Self-pay | Admitting: Pharmacy Technician

## 2020-05-02 DIAGNOSIS — W07XXXA Fall from chair, initial encounter: Secondary | ICD-10-CM | POA: Insufficient documentation

## 2020-05-02 DIAGNOSIS — R0902 Hypoxemia: Secondary | ICD-10-CM | POA: Diagnosis not present

## 2020-05-02 DIAGNOSIS — I10 Essential (primary) hypertension: Secondary | ICD-10-CM | POA: Insufficient documentation

## 2020-05-02 DIAGNOSIS — Z90710 Acquired absence of both cervix and uterus: Secondary | ICD-10-CM | POA: Diagnosis not present

## 2020-05-02 DIAGNOSIS — E7800 Pure hypercholesterolemia, unspecified: Secondary | ICD-10-CM | POA: Insufficient documentation

## 2020-05-02 DIAGNOSIS — Z8261 Family history of arthritis: Secondary | ICD-10-CM | POA: Insufficient documentation

## 2020-05-02 DIAGNOSIS — Z8673 Personal history of transient ischemic attack (TIA), and cerebral infarction without residual deficits: Secondary | ICD-10-CM | POA: Diagnosis not present

## 2020-05-02 DIAGNOSIS — Z8249 Family history of ischemic heart disease and other diseases of the circulatory system: Secondary | ICD-10-CM | POA: Diagnosis not present

## 2020-05-02 DIAGNOSIS — Z96652 Presence of left artificial knee joint: Secondary | ICD-10-CM | POA: Diagnosis not present

## 2020-05-02 DIAGNOSIS — R55 Syncope and collapse: Secondary | ICD-10-CM | POA: Diagnosis not present

## 2020-05-02 DIAGNOSIS — M6281 Muscle weakness (generalized): Secondary | ICD-10-CM | POA: Insufficient documentation

## 2020-05-02 DIAGNOSIS — Z79899 Other long term (current) drug therapy: Secondary | ICD-10-CM | POA: Diagnosis not present

## 2020-05-02 DIAGNOSIS — Z823 Family history of stroke: Secondary | ICD-10-CM | POA: Diagnosis not present

## 2020-05-02 DIAGNOSIS — Z888 Allergy status to other drugs, medicaments and biological substances status: Secondary | ICD-10-CM | POA: Diagnosis not present

## 2020-05-02 DIAGNOSIS — E538 Deficiency of other specified B group vitamins: Secondary | ICD-10-CM | POA: Diagnosis not present

## 2020-05-02 DIAGNOSIS — R58 Hemorrhage, not elsewhere classified: Secondary | ICD-10-CM | POA: Diagnosis not present

## 2020-05-02 DIAGNOSIS — M25552 Pain in left hip: Secondary | ICD-10-CM

## 2020-05-02 DIAGNOSIS — Y9389 Activity, other specified: Secondary | ICD-10-CM | POA: Insufficient documentation

## 2020-05-02 DIAGNOSIS — Z882 Allergy status to sulfonamides status: Secondary | ICD-10-CM | POA: Insufficient documentation

## 2020-05-02 DIAGNOSIS — M25559 Pain in unspecified hip: Secondary | ICD-10-CM | POA: Diagnosis not present

## 2020-05-02 DIAGNOSIS — Z20822 Contact with and (suspected) exposure to covid-19: Secondary | ICD-10-CM | POA: Insufficient documentation

## 2020-05-02 DIAGNOSIS — F0390 Unspecified dementia without behavioral disturbance: Secondary | ICD-10-CM | POA: Insufficient documentation

## 2020-05-02 DIAGNOSIS — W19XXXA Unspecified fall, initial encounter: Secondary | ICD-10-CM | POA: Diagnosis not present

## 2020-05-02 DIAGNOSIS — S0990XA Unspecified injury of head, initial encounter: Secondary | ICD-10-CM | POA: Diagnosis not present

## 2020-05-02 DIAGNOSIS — S3993XA Unspecified injury of pelvis, initial encounter: Secondary | ICD-10-CM | POA: Diagnosis not present

## 2020-05-02 DIAGNOSIS — S199XXA Unspecified injury of neck, initial encounter: Secondary | ICD-10-CM | POA: Diagnosis not present

## 2020-05-02 DIAGNOSIS — S0083XA Contusion of other part of head, initial encounter: Secondary | ICD-10-CM

## 2020-05-02 DIAGNOSIS — Z881 Allergy status to other antibiotic agents status: Secondary | ICD-10-CM | POA: Insufficient documentation

## 2020-05-02 DIAGNOSIS — R42 Dizziness and giddiness: Secondary | ICD-10-CM

## 2020-05-02 DIAGNOSIS — I083 Combined rheumatic disorders of mitral, aortic and tricuspid valves: Secondary | ICD-10-CM | POA: Diagnosis not present

## 2020-05-02 DIAGNOSIS — I213 ST elevation (STEMI) myocardial infarction of unspecified site: Secondary | ICD-10-CM | POA: Diagnosis not present

## 2020-05-02 DIAGNOSIS — Z7982 Long term (current) use of aspirin: Secondary | ICD-10-CM | POA: Insufficient documentation

## 2020-05-02 DIAGNOSIS — S8992XA Unspecified injury of left lower leg, initial encounter: Secondary | ICD-10-CM | POA: Diagnosis not present

## 2020-05-02 DIAGNOSIS — I951 Orthostatic hypotension: Secondary | ICD-10-CM | POA: Diagnosis not present

## 2020-05-02 DIAGNOSIS — R4182 Altered mental status, unspecified: Secondary | ICD-10-CM | POA: Diagnosis present

## 2020-05-02 DIAGNOSIS — I493 Ventricular premature depolarization: Secondary | ICD-10-CM | POA: Diagnosis present

## 2020-05-02 DIAGNOSIS — I959 Hypotension, unspecified: Secondary | ICD-10-CM | POA: Diagnosis not present

## 2020-05-02 DIAGNOSIS — M25562 Pain in left knee: Secondary | ICD-10-CM | POA: Diagnosis not present

## 2020-05-02 DIAGNOSIS — R9431 Abnormal electrocardiogram [ECG] [EKG]: Secondary | ICD-10-CM | POA: Diagnosis not present

## 2020-05-02 LAB — CBC WITH DIFFERENTIAL/PLATELET
Abs Immature Granulocytes: 0.05 10*3/uL (ref 0.00–0.07)
Basophils Absolute: 0.1 10*3/uL (ref 0.0–0.1)
Basophils Relative: 1 %
Eosinophils Absolute: 0 10*3/uL (ref 0.0–0.5)
Eosinophils Relative: 0 %
HCT: 41.9 % (ref 36.0–46.0)
Hemoglobin: 13.3 g/dL (ref 12.0–15.0)
Immature Granulocytes: 1 %
Lymphocytes Relative: 16 %
Lymphs Abs: 1.7 10*3/uL (ref 0.7–4.0)
MCH: 30.3 pg (ref 26.0–34.0)
MCHC: 31.7 g/dL (ref 30.0–36.0)
MCV: 95.4 fL (ref 80.0–100.0)
Monocytes Absolute: 0.4 10*3/uL (ref 0.1–1.0)
Monocytes Relative: 4 %
Neutro Abs: 8.4 10*3/uL — ABNORMAL HIGH (ref 1.7–7.7)
Neutrophils Relative %: 78 %
Platelets: 259 10*3/uL (ref 150–400)
RBC: 4.39 MIL/uL (ref 3.87–5.11)
RDW: 13.3 % (ref 11.5–15.5)
WBC: 10.7 10*3/uL — ABNORMAL HIGH (ref 4.0–10.5)
nRBC: 0 % (ref 0.0–0.2)

## 2020-05-02 LAB — TROPONIN I (HIGH SENSITIVITY)
Troponin I (High Sensitivity): 7 ng/L (ref <18)
Troponin I (High Sensitivity): 7 ng/L (ref <18)

## 2020-05-02 LAB — COMPREHENSIVE METABOLIC PANEL
ALT: 16 U/L (ref 0–44)
AST: 28 U/L (ref 15–41)
Albumin: 3.7 g/dL (ref 3.5–5.0)
Alkaline Phosphatase: 59 U/L (ref 38–126)
Anion gap: 12 (ref 5–15)
BUN: 13 mg/dL (ref 8–23)
CO2: 22 mmol/L (ref 22–32)
Calcium: 9.2 mg/dL (ref 8.9–10.3)
Chloride: 103 mmol/L (ref 98–111)
Creatinine, Ser: 0.79 mg/dL (ref 0.44–1.00)
GFR calc Af Amer: >60 mL/min (ref >60)
GFR calc non Af Amer: >60 mL/min (ref >60)
Glucose, Bld: 125 mg/dL — ABNORMAL HIGH (ref 70–99)
Potassium: 3.6 mmol/L (ref 3.5–5.1)
Sodium: 137 mmol/L (ref 135–145)
Total Bilirubin: 0.9 mg/dL (ref 0.3–1.2)
Total Protein: 6.6 g/dL (ref 6.5–8.1)

## 2020-05-02 LAB — SARS CORONAVIRUS 2 BY RT PCR (HOSPITAL ORDER, PERFORMED IN ~~LOC~~ HOSPITAL LAB): SARS Coronavirus 2: NEGATIVE

## 2020-05-02 LAB — CBG MONITORING, ED: Glucose-Capillary: 81 mg/dL (ref 70–99)

## 2020-05-02 MED ORDER — ONDANSETRON HCL 4 MG PO TABS: 4.0000 mg | ORAL_TABLET | Freq: Four times a day (QID) | ORAL | Status: DC | PRN

## 2020-05-02 MED ORDER — ONDANSETRON HCL 4 MG/2ML IJ SOLN: 4.0000 mg | Freq: Four times a day (QID) | INTRAMUSCULAR | Status: DC | PRN

## 2020-05-02 MED ORDER — TRAMADOL HCL 50 MG PO TABS: 50.0000 mg | ORAL_TABLET | Freq: Four times a day (QID) | ORAL | 0 refills | Status: DC | PRN

## 2020-05-02 MED ORDER — CLOPIDOGREL BISULFATE 75 MG PO TABS
75.0000 mg | ORAL_TABLET | Freq: Every day | ORAL | Status: DC
  Administered 2020-05-03 – 2020-05-04 (×2): 75 mg via ORAL
  Filled 2020-05-02 (×2): qty 1

## 2020-05-02 MED ORDER — ASPIRIN 325 MG PO TABS
325.0000 mg | ORAL_TABLET | Freq: Every day | ORAL | Status: DC
  Administered 2020-05-03 – 2020-05-04 (×2): 325 mg via ORAL
  Filled 2020-05-02 (×2): qty 1

## 2020-05-02 MED ORDER — TRAMADOL HCL 50 MG PO TABS
50.0000 mg | ORAL_TABLET | Freq: Once | ORAL | Status: AC
  Administered 2020-05-02: 50 mg via ORAL
  Filled 2020-05-02: qty 1

## 2020-05-02 MED ORDER — SODIUM CHLORIDE 0.9 % IV BOLUS
500.0000 mL | Freq: Once | INTRAVENOUS | Status: AC
  Administered 2020-05-02: 500 mL via INTRAVENOUS

## 2020-05-02 MED ORDER — ENOXAPARIN SODIUM 40 MG/0.4ML ~~LOC~~ SOLN
40.0000 mg | SUBCUTANEOUS | Status: DC
  Administered 2020-05-02 – 2020-05-03 (×2): 40 mg via SUBCUTANEOUS
  Filled 2020-05-02 (×2): qty 0.4

## 2020-05-02 MED ORDER — SODIUM CHLORIDE 0.9 % IV SOLN: INTRAVENOUS | Status: DC

## 2020-05-02 MED ORDER — SODIUM CHLORIDE 0.9% FLUSH
3.0000 mL | Freq: Two times a day (BID) | INTRAVENOUS | Status: DC
  Administered 2020-05-03 – 2020-05-04 (×2): 3 mL via INTRAVENOUS

## 2020-05-02 MED ORDER — ACETAMINOPHEN 325 MG PO TABS
650.0000 mg | ORAL_TABLET | Freq: Once | ORAL | Status: AC
  Administered 2020-05-02: 650 mg via ORAL
  Filled 2020-05-02: qty 2

## 2020-05-02 NOTE — ED Notes (Signed)
This tech was ambulating the pt in the hallway with a walker and got back into the room next to the bed when the pt passed out. This tech lowered the pt to the floor. Pt did not hit her head and no injuries occurred. RN notified and helped assist pt back into the bed.

## 2020-05-02 NOTE — H&P (Signed)
History and Physical   PENNELOPE FORAND [id number] DOB: Jun 10, 1927 DOA: 05/02/2020  Referring MD/NP/PA: Dr. Darl Householder  PCP: Lavone Orn, MD   Outpatient Specialists: None  Patient coming from: Home  Chief Complaint: Passing out  HPI: Sheila Parrish is a 86 y.o. female with medical history significant of orthostatic hypotension, dementia, history of CVA, ventricular bigeminy and previous near syncopal episodes who was brought in after sustaining is syncopal episode.  Patient was apparently sitting in the chair reading the paper.  She reached father for 1 as she was trying to turn the patient and then lost her balance and fell forward she did strike her face against the ground.  Patient was able to come around quickly.  She was brought to the ER where she was being evaluated.  Initially appears to be orthostatic.  Patient did better after some fluids and was being walked with a walker when she suddenly had another episode of syncope in the emergency room.  She has since recovered.  Patient is being admitted to the hospital therefore for evaluation of syncope and further treatment.  She denied any dizziness at this point denied any chest pain.  Denied any nausea vomiting or diarrhea.  She has had multiple evaluations of syncope before by Dr. Golden Hurter the cardiologist.  This appears to be another episode.  She was also seen by Dr. Riki Sheer for EP studies.  So far previous work-up has been negative...  ED Course: Temperature 97.9 blood pressure 178/81 pulse 111 respiratory 18 oxygen sat 90% room air.  Chemistry showed a glucose of 125 otherwise uneventful.  White count is 10.7.  EKG showed no significant findings.  Patient is being admitted with syncopal episode for work-up.  Review of Systems: As per HPI otherwise 10 point review of systems negative.    Past Medical History:  Diagnosis Date  . High cholesterol   . Scarlet fever 1936   Hospitalized for a month  . Stroke (cerebrum) ([place])   .  Syncope     Past Surgical History:  Procedure Laterality Date  . ABDOMINAL HYSTERECTOMY    . implantable loop recorder placement  03/03/2020   Medtronic Reveal Linq model [place] 11(SN [id number]) implanted for cryptogenic stroke  . TOTAL KNEE ARTHROPLASTY Left 2010     reports that she has never smoked. She has never used smokeless tobacco. She reports that she does not drink alcohol or use drugs.  Allergies  Allergen Reactions  . Donepezil Nausea And Vomiting  . Alendronate Other (See Comments)    Difficulty swallowing  . Atorvastatin Other (See Comments)    Leg cramping  . Bactrim [Sulfamethoxazole-Trimethoprim] Nausea And Vomiting  . Macrodantin [Nitrofurantoin Macrocrystal] Other (See Comments)    Unknown reaction  . Simvastatin Other (See Comments)    Leg cramping  . Sulfa Antibiotics Nausea And Vomiting  . Azithromycin Rash    Family History  Problem Relation Age of Onset  . Arthritis Mother        Died at 64  . Stroke Father 4       Died at age 25  . CAD Neg Hx      Prior to Admission medications   Medication Sig Start Date End Date Taking? Authorizing Provider  aspirin 325 MG tablet Take 1 tablet (325 mg total) by mouth daily. 03/02/20  Yes Swayze, Ava, DO  clopidogrel (PLAVIX) 75 MG tablet Take 1 tablet (75 mg total) by mouth daily. 04/02/20  Yes Thompson Grayer, MD  traMADol (  ULTRAM) 50 MG tablet Take 1 tablet (50 mg total) by mouth every 6 (six) hours as needed for moderate pain (facial contusion). 05/02/20   Drenda Freeze, MD    Physical Exam: Vitals:   05/02/20 1830 05/02/20 1845 05/02/20 1900 05/02/20 2000  BP: (!) 142/57 (!) 150/74 (!) 157/64 (!) 134/59  Pulse:    91  Resp: 12 14 15 16   Temp:      TempSrc:      SpO2:    95%      Constitutional: Weak and debilitated Vitals:   05/02/20 1830 05/02/20 1845 05/02/20 1900 05/02/20 2000  BP: (!) 142/57 (!) 150/74 (!) 157/64 (!) 134/59  Pulse:    91  Resp: 12 14 15 16   Temp:      TempSrc:        SpO2:    95%   Eyes: PERRL, lids and conjunctivae normal ENMT: Mucous membranes are dry. Posterior pharynx clear of any exudate or lesions.Normal dentition.  Neck: normal, supple, no masses, no thyromegaly Respiratory: clear to auscultation bilaterally, no wheezing, no crackles. Normal respiratory effort. No accessory muscle use.  Cardiovascular: Regular rate and rhythm, no murmurs / rubs / gallops. No extremity edema. 2+ pedal pulses. No carotid bruits.  Abdomen: no tenderness, no masses palpated. No hepatosplenomegaly. Bowel sounds positive.  Musculoskeletal: no clubbing / cyanosis. No joint deformity upper and lower extremities. Good ROM, no contractures. Normal muscle tone.  Skin: no rashes, lesions, ulcers. No induration Neurologic: CN 2-12 grossly intact. Sensation intact, DTR normal. Strength 5/5 in all 4.  Psychiatric: Normal judgment and insight. Alert and oriented x 3. Normal mood.     Labs on Admission: I have personally reviewed following labs and imaging studies  CBC: Recent Labs  Lab 05/02/20 1709  WBC 10.7*  NEUTROABS 8.4*  HGB 13.3  HCT 41.9  MCV 95.4  PLT [id number]   Basic Metabolic Panel: Recent Labs  Lab 05/02/20 1709  NA 137  K 3.6  CL 103  CO2 22  GLUCOSE 125*  BUN 13  CREATININE 0.79  CALCIUM 9.2   GFR: CrCl cannot be calculated (Unknown ideal weight.). Liver Function Tests: Recent Labs  Lab 05/02/20 1709  AST 28  ALT 16  ALKPHOS 59  BILITOT 0.9  PROT 6.6  ALBUMIN 3.7   No results for input(s): LIPASE, AMYLASE in the last 168 hours. No results for input(s): AMMONIA in the last 168 hours. Coagulation Profile: No results for input(s): INR, PROTIME in the last 168 hours. Cardiac Enzymes: No results for input(s): CKTOTAL, CKMB, CKMBINDEX, TROPONINI in the last 168 hours. BNP (last 3 results) No results for input(s): PROBNP in the last 8760 hours. HbA1C: No results for input(s): HGBA1C in the last 72 hours. CBG: Recent Labs  Lab  05/02/20 1725  GLUCAP 81   Lipid Profile: No results for input(s): CHOL, HDL, LDLCALC, TRIG, CHOLHDL, LDLDIRECT in the last 72 hours. Thyroid Function Tests: No results for input(s): TSH, T4TOTAL, FREET4, T3FREE, THYROIDAB in the last 72 hours. Anemia Panel: No results for input(s): VITAMINB12, FOLATE, FERRITIN, TIBC, IRON, RETICCTPCT in the last 72 hours. Urine analysis:    Component Value Date/Time   COLORURINE YELLOW 04/13/2020 1850   APPEARANCEUR CLOUDY (A) 04/13/2020 1850   LABSPEC 1.019 04/13/2020 1850   PHURINE 6.0 04/13/2020 1850   GLUCOSEU NEGATIVE 04/13/2020 1850   HGBUR NEGATIVE 04/13/2020 1850   BILIRUBINUR NEGATIVE 04/13/2020 1850   KETONESUR 5 (A) 04/13/2020 [place] 04/13/2020 1850  UROBILINOGEN 1.0 04/07/2009 1413   NITRITE POSITIVE (A) 04/13/2020 1850   LEUKOCYTESUR NEGATIVE 04/13/2020 1850   Sepsis Labs: @LABRCNTIP (procalcitonin:4,lacticidven:4) )No results found for this or any previous visit (from the past 240 hour(s)).   Radiological Exams on Admission: DG Pelvis 1-2 Views  Result Date: 05/02/2020 CLINICAL DATA:  Pain following fall EXAM: PELVIS - 1-2 VIEW COMPARISON:  None. FINDINGS: There is no evidence of pelvic fracture or dislocation. There is moderate symmetric narrowing of each hip joint. No erosive change. Bones appear somewhat osteoporotic. There is degenerative change in the lower lumbar spine with lower lumbar levoscoliosis. IMPRESSION: Symmetric narrowing of each hip joint. Scoliosis and degenerative change in lower lumbar spine. Evidence of a degree of osteoporosis. No fracture or dislocation. Electronically Signed   By: Lowella Grip III M.D.   On: 05/02/2020 13:16   CT HEAD WO CONTRAST  Result Date: 05/02/2020 CLINICAL DATA:  [place] out of a chair with trauma to the head and neck. EXAM: CT HEAD WITHOUT CONTRAST CT CERVICAL SPINE WITHOUT CONTRAST TECHNIQUE: Multidetector CT imaging of the head and cervical spine was performed  following the standard protocol without intravenous contrast. Multiplanar CT image reconstructions of the cervical spine were also generated. COMPARISON:  04/13/2020 FINDINGS: CT HEAD FINDINGS Brain: No focal finding affects the brainstem or cerebellum. Cerebral hemispheres show age related atrophy. Old lacunar infarction in the left internal capsule. Old infarction in the medial right occipital lobe. Confluent chronic small vessel disease elsewhere affecting the cerebral hemispheric white matter. No sign of acute infarction, mass lesion, hemorrhage, hydrocephalus or extra-axial collection. Vascular: There is atherosclerotic calcification of the major vessels at the base of the brain. Skull: Negative Sinuses/Orbits: No significant sinus disease.  Orbits negative. Other: None CT CERVICAL SPINE FINDINGS Alignment: Straightening of the normal cervical lordosis. 2 mm degenerative anterolisthesis C3-4. Skull base and vertebrae: No fracture. Likely benign lucency posteriorly within the C2 vertebral body. Osteopenia in general. Soft tissues and spinal canal: No significant soft tissue finding. Disc levels: No significant abnormality at the foramen magnum or C1-2. C2-3: Facet osteoarthritis on the right.  No stenosis. C3-4: Facet osteoarthritis on the right with 2 mm of anterolisthesis. Bony foraminal narrowing on the right. C4-5: Mild disc space narrowing. Facet osteoarthritis on the right. Bony foraminal stenosis on the right. C5-6: Bilateral uncovertebral osteophytes with mild bony foraminal narrowing. C6-7: Minimal uncovertebral osteophytes. Minimal bilateral foraminal narrowing. C7-T1: Negative interspace. Upper chest: Negative Other: None IMPRESSION: Head CT: No acute or traumatic finding. Old ischemic changes as seen previously and outlined above. Cervical spine CT: No acute or traumatic finding. Chronic degenerative spondylosis and facet arthropathy as seen previously and outlined above. Electronically Signed   By:  Nelson Chimes M.D.   On: 05/02/2020 16:20   CT CERVICAL SPINE WO CONTRAST  Result Date: 05/02/2020 CLINICAL DATA:  [place] out of a chair with trauma to the head and neck. EXAM: CT HEAD WITHOUT CONTRAST CT CERVICAL SPINE WITHOUT CONTRAST TECHNIQUE: Multidetector CT imaging of the head and cervical spine was performed following the standard protocol without intravenous contrast. Multiplanar CT image reconstructions of the cervical spine were also generated. COMPARISON:  04/13/2020 FINDINGS: CT HEAD FINDINGS Brain: No focal finding affects the brainstem or cerebellum. Cerebral hemispheres show age related atrophy. Old lacunar infarction in the left internal capsule. Old infarction in the medial right occipital lobe. Confluent chronic small vessel disease elsewhere affecting the cerebral hemispheric white matter. No sign of acute infarction, mass lesion, hemorrhage, hydrocephalus or extra-axial collection.  Vascular: There is atherosclerotic calcification of the major vessels at the base of the brain. Skull: Negative Sinuses/Orbits: No significant sinus disease.  Orbits negative. Other: None CT CERVICAL SPINE FINDINGS Alignment: Straightening of the normal cervical lordosis. 2 mm degenerative anterolisthesis C3-4. Skull base and vertebrae: No fracture. Likely benign lucency posteriorly within the C2 vertebral body. Osteopenia in general. Soft tissues and spinal canal: No significant soft tissue finding. Disc levels: No significant abnormality at the foramen magnum or C1-2. C2-3: Facet osteoarthritis on the right.  No stenosis. C3-4: Facet osteoarthritis on the right with 2 mm of anterolisthesis. Bony foraminal narrowing on the right. C4-5: Mild disc space narrowing. Facet osteoarthritis on the right. Bony foraminal stenosis on the right. C5-6: Bilateral uncovertebral osteophytes with mild bony foraminal narrowing. C6-7: Minimal uncovertebral osteophytes. Minimal bilateral foraminal narrowing. C7-T1: Negative interspace.  Upper chest: Negative Other: None IMPRESSION: Head CT: No acute or traumatic finding. Old ischemic changes as seen previously and outlined above. Cervical spine CT: No acute or traumatic finding. Chronic degenerative spondylosis and facet arthropathy as seen previously and outlined above. Electronically Signed   By: Nelson Chimes M.D.   On: 05/02/2020 16:20   DG Knee Complete 4 Views Left  Result Date: 05/02/2020 CLINICAL DATA:  Pain after fall EXAM: LEFT KNEE - COMPLETE 4+ VIEW COMPARISON:  None. FINDINGS: Frontal, lateral, and bilateral oblique views were obtained. Patient is status post total knee replacement with femoral and tibial prosthetic components well-seated. No fracture or dislocation. No appreciable joint effusion. No erosive change. There is calcification inferior to the patella. IMPRESSION: Status post total knee replacement with prosthetic components well-seated. No fracture or dislocation. Suspect a degree of patellar tendinosis. Electronically Signed   By: Lowella Grip III M.D.   On: 05/02/2020 13:15    EKG: Independently reviewed.  It shows sinus rhythm with a rate of 93, normal intervals, no significant ST changes.  Assessment/Plan Principal Problem:   Syncope and collapse Active Problems:   Orthostatic dizziness   Frequent PVCs   AMS (altered mental status)     #1 syncopal episode: Recurrent.  Patient has had work-up before.  Admit the patient and monitor on telemetric.  Hydrate the patient.  Consider cardiology consult again in the morning.  Get another echo.  PT and OT consult in the morning.  #2 history of orthostasis: Patient does not appear to be orthostatic now.  Continue monitoring.  #3 neck pain: CT cervical spine and head are negative.  Probably from the fall.  Monitoring to continue.   DVT prophylaxis: Lovenox Code Status: Full code Family Communication: No family at the bedside Disposition Plan: To be determined Consults called: None Admission  status: Observation  Severity of Illness: The appropriate patient status for this patient is OBSERVATION. Observation status is judged to be reasonable and necessary in order to provide the required intensity of service to ensure the patient's safety. The patient's presenting symptoms, physical exam findings, and initial radiographic and laboratory data in the context of their medical condition is felt to place them at decreased risk for further clinical deterioration. Furthermore, it is anticipated that the patient will be medically stable for discharge from the hospital within 2 midnights of admission. The following factors support the patient status of observation.   " The patient's presenting symptoms include syncope. " The physical exam findings include no significant findings on exam. " The initial radiographic and laboratory data are largely uneventful.     Barbette Merino MD [facility] Pager [phone]  [phone]  If 7PM-7AM, please contact night-coverage [email or web address] Password [facility]  05/02/2020, 9:11 PM

## 2020-05-02 NOTE — ED Notes (Signed)
Pt cleaned and changed, peri care performed, pure wick placed on pt.

## 2020-05-02 NOTE — ED Provider Notes (Signed)
[facility] Emergency Department Provider Note MRN:  [id number]  Arrival date & time: 05/02/20     Chief Complaint   Fall   History of Present Illness   Sheila Parrish is a 86 y.o. year-old female with a history of stroke, dementia presenting to the ED with chief complaint of fall.  Witnessed fall, was sitting in a chair, reading the paper, reached too far when trying to turn the page, lost balance and fell forward, striking face against the ground.  I was unable to obtain an accurate HPI, PMH, or ROS due to the patient's dementia.  Level 5 caveat.  Review of Systems  Positive for fall, dementia, facial trauma.  Patient's Health History    Past Medical History:  Diagnosis Date  . High cholesterol   . Scarlet fever 1936   Hospitalized for a month  . Stroke (cerebrum) ([place])   . Syncope     Past Surgical History:  Procedure Laterality Date  . ABDOMINAL HYSTERECTOMY    . implantable loop recorder placement  03/03/2020   Medtronic Reveal Linq model [place] 11(SN [id number]) implanted for cryptogenic stroke  . TOTAL KNEE ARTHROPLASTY Left 2010    Family History  Problem Relation Age of Onset  . Arthritis Mother        Died at 22  . Stroke Father 47       Died at age 58  . CAD Neg Hx     Social History   Socioeconomic History  . Marital status: Married    Spouse name: Not on file  . Number of children: Not on file  . Years of education: Not on file  . Highest education level: Not on file  Occupational History  . Occupation: Retired  Tobacco Use  . Smoking status: Never Smoker  . Smokeless tobacco: Never Used  Substance and Sexual Activity  . Alcohol use: No  . Drug use: No  . Sexual activity: Not on file  Other Topics Concern  . Not on file  Social History Narrative   Lives in 1 story apartment on the 2nd floor, with her husband   Has 3 adult children   4 year degree   Retired 1st grade teacher   Social Determinants of Adult nurse Strain:   . Difficulty of Paying Living Expenses:   Food Insecurity:   . Worried About Charity fundraiser in the Last Year:   . Arboriculturist in the Last Year:   Transportation Needs:   . Film/video editor (Medical):   [name] Kitchen Lack of Transportation (Non-Medical):   Physical Activity:   . Days of Exercise per Week:   . Minutes of Exercise per Session:   Stress:   . Feeling of Stress :   Social Connections:   . Frequency of Communication with Friends and Family:   . Frequency of Social Gatherings with Friends and Family:   . Attends Religious Services:   . Active Member of Clubs or Organizations:   . Attends Archivist Meetings:   [name] Kitchen Marital Status:   Intimate Partner Violence:   . Fear of Current or Ex-Partner:   . Emotionally Abused:   [name] Kitchen Physically Abused:   . Sexually Abused:      Physical Exam   Vitals:   05/02/20 1215 05/02/20 1230  BP: (!) 150/80 (!) 142/75  Pulse: 87 89  Resp: 17 15  Temp:    SpO2: 97% 97%  CONSTITUTIONAL: Chronically ill-appearing, NAD NEURO:  Alert and oriented x 3, no focal deficits EYES:  eyes equal and reactive ENT/NECK:  no LAD, no JVD, no nasal septal hematoma, dried blood right nare CARDIO: Regular rate, well-perfused, normal S1 and S2 PULM:  CTAB no wheezing or rhonchi GI/GU:  normal bowel sounds, non-distended, non-tender MSK/SPINE:  No gross deformities, no edema, pain elicited with range of motion of the left hip SKIN: Bruising to right periorbital region, nasal bridge, abrasion to right lower lip PSYCH:  Appropriate speech and behavior  *Additional and/or pertinent findings included in MDM below  Diagnostic and Interventional Summary    EKG Interpretation  Date/Time:  Friday May 02 2020 11:58:51 EDT Ventricular Rate:  88 PR Interval:    QRS Duration: 90 QT Interval:  392 QTC Calculation: 475 R Axis:   51 Text Interpretation: Normal sinus rhythm Low voltage, precordial leads Borderline  repolarization abnormality Confirmed by Gerlene Fee [phone]) on 05/02/2020 12:03:02 PM      Labs Reviewed - No data to display  DG Knee Complete 4 Views Left  Final Result    DG Pelvis 1-2 Views  Final Result    CT HEAD WO CONTRAST    (Results Pending)  CT CERVICAL SPINE WO CONTRAST    (Results Pending)    Medications - No data to display   Procedures  /  Critical Care Procedures  ED Course and Medical Decision Making  I have reviewed the triage vital signs, the nursing notes, and pertinent available records from the EMR.  Listed above are laboratory and imaging tests that I personally ordered, reviewed, and interpreted and then considered in my medical decision making (see below for details).      Mechanical ground-level fall, anticoagulated, will need CT to exclude intracranial bleeding.  Also with left hip or knee pain, difficult to ascertain exact location due to underlying dementia.  Daughter is at bedside and explains that patient is at her baseline currently.  Imaging pending.  X-rays without fracture, would consider attempting ambulation to ensure no signs of occult fracture.  Signed out to oncoming provider at shift change.  Barth Kirks. Sedonia Small, MD [place] [email or web address]  Final Clinical Impressions(s) / ED Diagnoses     ICD-10-CM   1. Fall, initial encounter  W19.XXXA   2. Hip pain, acute, left  M25.552 DG Pelvis 1-2 Views    DG Pelvis 1-2 Views    ED Discharge Orders    None       Discharge Instructions Discussed with and Provided to Patient:   Discharge Instructions   None       Maudie Flakes, MD 05/02/20 1554

## 2020-05-02 NOTE — ED Provider Notes (Addendum)
  Physical Exam  BP (!) 133/100   Pulse (!) 111   Temp 97.9 F (36.6 C) (Oral)   Resp 17   SpO2 99%   Physical Exam  ED Course/Procedures     Procedures  MDM  Care assumed at 4 pm.  Patient had a fall and hit her head.  She also has some hip pain.  X-rays were negative and signout pending CT head and ambulation.  4:55 PM CT head and neck unremarkable.  She has some bruising on her face and I suspect soft tissue hematoma versus nasal bone cartilage fracture.  Patient ambulated with her walker.  Stable for discharge.  Recommend Tylenol and as needed tramadol.  5:04 PM She ambulated and then had an episode of syncope. Will get cbc, cmp, trop, hydrate patient.    6:40 PM Labs are unremarkable.  Patient is still very dizzy and afraid to get up and passed out again.  Since she had possible syncope earlier and second episode of syncope today, will admit for observation.     Drenda Freeze, MD 05/02/20 1656    Drenda Freeze, MD 05/02/20 [phone]

## 2020-05-02 NOTE — ED Notes (Signed)
Patient transported to CT 

## 2020-05-02 NOTE — ED Triage Notes (Signed)
Pt bib ems after witnessed fall out of chair in which pt hit her nose. No LOC. Pt on plavix. Neurologically at baseline. VSS with EMS. Ccollar in place. Bruising noted to R eye.

## 2020-05-02 NOTE — ED Notes (Signed)
Pt was ambulating in hallway with walker. When pt got back to the room, pt had syncopal episode while standing next to bed, NT lowered pt down to the floor. Pt did not hit head nor fall. This RN was alerted that pt had syncopal episode. RN and NT helped put pt back in bed, vitals stable. Dr. Darl Householder made aware, new orders for blood work and 500 ml fluid bolus were given.

## 2020-05-03 ENCOUNTER — Observation Stay (HOSPITAL_BASED_OUTPATIENT_CLINIC_OR_DEPARTMENT_OTHER): Payer: Medicare PPO

## 2020-05-03 ENCOUNTER — Other Ambulatory Visit: Payer: Self-pay

## 2020-05-03 ENCOUNTER — Encounter (HOSPITAL_COMMUNITY): Payer: Self-pay | Admitting: Internal Medicine

## 2020-05-03 ENCOUNTER — Other Ambulatory Visit (HOSPITAL_COMMUNITY): Payer: Medicare PPO

## 2020-05-03 DIAGNOSIS — E7800 Pure hypercholesterolemia, unspecified: Secondary | ICD-10-CM | POA: Diagnosis not present

## 2020-05-03 DIAGNOSIS — F0390 Unspecified dementia without behavioral disturbance: Secondary | ICD-10-CM | POA: Diagnosis not present

## 2020-05-03 DIAGNOSIS — Z20822 Contact with and (suspected) exposure to covid-19: Secondary | ICD-10-CM | POA: Diagnosis not present

## 2020-05-03 DIAGNOSIS — I340 Nonrheumatic mitral (valve) insufficiency: Secondary | ICD-10-CM | POA: Diagnosis not present

## 2020-05-03 DIAGNOSIS — M6281 Muscle weakness (generalized): Secondary | ICD-10-CM | POA: Diagnosis not present

## 2020-05-03 DIAGNOSIS — I361 Nonrheumatic tricuspid (valve) insufficiency: Secondary | ICD-10-CM

## 2020-05-03 DIAGNOSIS — I351 Nonrheumatic aortic (valve) insufficiency: Secondary | ICD-10-CM | POA: Diagnosis not present

## 2020-05-03 DIAGNOSIS — S0083XA Contusion of other part of head, initial encounter: Secondary | ICD-10-CM | POA: Diagnosis not present

## 2020-05-03 DIAGNOSIS — Z8673 Personal history of transient ischemic attack (TIA), and cerebral infarction without residual deficits: Secondary | ICD-10-CM | POA: Diagnosis not present

## 2020-05-03 DIAGNOSIS — E538 Deficiency of other specified B group vitamins: Secondary | ICD-10-CM | POA: Diagnosis not present

## 2020-05-03 DIAGNOSIS — I951 Orthostatic hypotension: Secondary | ICD-10-CM | POA: Diagnosis not present

## 2020-05-03 DIAGNOSIS — R55 Syncope and collapse: Secondary | ICD-10-CM | POA: Diagnosis not present

## 2020-05-03 DIAGNOSIS — I10 Essential (primary) hypertension: Secondary | ICD-10-CM | POA: Diagnosis not present

## 2020-05-03 LAB — ECHOCARDIOGRAM COMPLETE
Height: 59 in
Weight: 1890.66 oz

## 2020-05-03 LAB — COMPREHENSIVE METABOLIC PANEL
ALT: 20 U/L (ref 0–44)
AST: 40 U/L (ref 15–41)
Albumin: 3.6 g/dL (ref 3.5–5.0)
Alkaline Phosphatase: 52 U/L (ref 38–126)
Anion gap: 11 (ref 5–15)
BUN: 14 mg/dL (ref 8–23)
CO2: 23 mmol/L (ref 22–32)
Calcium: 8.9 mg/dL (ref 8.9–10.3)
Chloride: 106 mmol/L (ref 98–111)
Creatinine, Ser: 0.71 mg/dL (ref 0.44–1.00)
GFR calc Af Amer: >60 mL/min (ref >60)
GFR calc non Af Amer: >60 mL/min (ref >60)
Glucose, Bld: 106 mg/dL — ABNORMAL HIGH (ref 70–99)
Potassium: 3.6 mmol/L (ref 3.5–5.1)
Sodium: 140 mmol/L (ref 135–145)
Total Bilirubin: 0.5 mg/dL (ref 0.3–1.2)
Total Protein: 6.3 g/dL — ABNORMAL LOW (ref 6.5–8.1)

## 2020-05-03 LAB — CBC
HCT: 40.3 % (ref 36.0–46.0)
Hemoglobin: 12.9 g/dL (ref 12.0–15.0)
MCH: 30.1 pg (ref 26.0–34.0)
MCHC: 32 g/dL (ref 30.0–36.0)
MCV: 94.2 fL (ref 80.0–100.0)
Platelets: 221 10*3/uL (ref 150–400)
RBC: 4.28 MIL/uL (ref 3.87–5.11)
RDW: 13.5 % (ref 11.5–15.5)
WBC: 7.1 10*3/uL (ref 4.0–10.5)
nRBC: 0 % (ref 0.0–0.2)

## 2020-05-03 LAB — URINALYSIS, ROUTINE W REFLEX MICROSCOPIC
Bilirubin Urine: NEGATIVE
Glucose, UA: NEGATIVE mg/dL
Hgb urine dipstick: NEGATIVE
Ketones, ur: 5 mg/dL — AB
Leukocytes,Ua: NEGATIVE
Nitrite: NEGATIVE
Protein, ur: NEGATIVE mg/dL
Specific Gravity, Urine: 1.014 (ref 1.005–1.030)
pH: 6 (ref 5.0–8.0)

## 2020-05-03 MED ORDER — ENSURE ENLIVE PO LIQD: 237.0000 mL | Freq: Two times a day (BID) | ORAL | Status: DC

## 2020-05-03 MED ORDER — ACETAMINOPHEN 325 MG PO TABS
650.0000 mg | ORAL_TABLET | Freq: Four times a day (QID) | ORAL | Status: DC | PRN
  Administered 2020-05-03: 650 mg via ORAL
  Filled 2020-05-03: qty 2

## 2020-05-03 NOTE — ED Notes (Signed)
Attempted to call report; advised that charge nurse on 2W has not reviewed the chart for appropriateness of assignment. Was advised that I need to call back in 30 minutes.

## 2020-05-03 NOTE — Progress Notes (Signed)
  Echocardiogram 2D Echocardiogram has been performed.  Darlina Sicilian M 05/03/2020, 8:12 AM

## 2020-05-03 NOTE — Care Management Obs Status (Signed)
[facility] NOTIFICATION   Patient Details  Name: Sheila Parrish MRN: [id number] Date of Birth: 1927/07/12   Medicare Observation Status Notification Given:  Yes    Carles Collet, RN 05/03/2020, 4:06 PM

## 2020-05-03 NOTE — Progress Notes (Signed)
Sheila Parrish  [id number] DOB: [birth year] DOA: 05/02/2020 PCP: Lavone Orn, MD    Brief Narrative:  86 year old with a history of orthostatic hypotension, dementia, CVA, and HTN who was brought to the ED after suffering a syncopal spell.  Reportedly patient was sitting in a chair reading a paper, reached while leaning over, and then suffered a syncopal spell striking her face against the ground.  She quickly recovered consciousness.  In the ED she was found to be orthostatic.  While being ambulated with a walker in the ED she suffered another syncopal spell.  Significant Events: 5/21 admit via [place] 5/22 TTE  Antimicrobials:  None  Subjective: Resting comfortably in bed. Has no new complaints. These episodes of syncope are reportedly consistent w/ her multiple prior episodes. Due to her dementia and prior CVA she is not able to communicate consistently, therefore it is not clear if she has preceding sx, but it does not appear that she does. The initial episode in this sequence occurred when sitting at the table reading the paper. She reached over to turn the page, and then slumped "like dead weight" to the floor. She has an attentive caregiver at home w/ her 24/7.   Assessment & Plan:  Recurrent syncopal spells Has previously undergone extensive work-up and is followed by cardiology/EP for same - etiology not likely to be elucidated during this hospital stay - begin PT/OT - possible d/c in next 24hrs to continue outpt w/u   History of orthostatic hypotension Will check an AM cortisol tomorrow   Dementia No agitation at present - has 24/7 care in her home   History of embolic CVA R CR March [id number] Suspicion was for occult atrial fibrillation  HTN Practice permissive HTN given advanced age and propensity for syncope    DVT prophylaxis: Lovenox Code Status: FULL CODE Family Communication: spoke w/ daughter at bedside at length  Status is: Observation  The patient remains  OBS appropriate and will d/c before 2 midnights.  Dispo: The patient is from: Home              Anticipated d/c is to: Home              Anticipated d/c date is: 1 day              Patient currently is not medically stable to d/c.  Consultants:  none  Objective: Blood pressure (!) 165/69, pulse 85, temperature 98.4 F (36.9 C), temperature source Oral, resp. rate 11, height 4\' 11"  (1.499 m), weight 53.6 kg, SpO2 94 %.  Intake/Output Summary (Last 24 hours) at 05/03/2020 0843 Last data filed at 05/03/2020 0459 Gross per 24 hour  Intake 1331.25 ml  Output -  Net 1331.25 ml   Filed Weights   05/03/20 0333  Weight: 53.6 kg    Examination: General: No acute respiratory distress Lungs: Clear to auscultation bilaterally without wheezes or crackles Cardiovascular: Regular rate and rhythm without murmur gallop or rub normal S1 and S2 Abdomen: Nontender, nondistended, soft, bowel sounds positive, no rebound, no ascites, no appreciable mass Extremities: No significant cyanosis, clubbing, or edema bilateral lower extremities  CBC: Recent Labs  Lab 05/02/20 1709 05/03/20 0456  WBC 10.7* 7.1  NEUTROABS 8.4*  --   HGB 13.3 12.9  HCT 41.9 40.3  MCV 95.4 94.2  PLT 259 [id number]   Basic Metabolic Panel: Recent Labs  Lab 05/02/20 1709 05/03/20 0456  NA 137 140  K 3.6 3.6  CL 103  106  CO2 22 23  GLUCOSE 125* 106*  BUN 13 14  CREATININE 0.79 0.71  CALCIUM 9.2 8.9   GFR: Estimated Creatinine Clearance: 34.3 mL/min (by C-G formula based on SCr of 0.71 mg/dL).  Liver Function Tests: Recent Labs  Lab 05/02/20 1709 05/03/20 0456  AST 28 40  ALT 16 20  ALKPHOS 59 52  BILITOT 0.9 0.5  PROT 6.6 6.3*  ALBUMIN 3.7 3.6    HbA1C: Hgb A1c MFr Bld  Date/Time Value Ref Range Status  02/29/2020 03:33 AM 5.8 (H) 4.8 - 5.6 % Final    Comment:    (NOTE) Pre diabetes:          5.7%-6.4% Diabetes:              >6.4% Glycemic control for   <7.0% adults with diabetes     CBG:  Recent Labs  Lab 05/02/20 1725  GLUCAP 81    Recent Results (from the past 240 hour(s))  SARS Coronavirus 2 by RT PCR (hospital order, performed in [facility] hospital lab) Nasopharyngeal Nasopharyngeal Swab     Status: None   Collection Time: 05/02/20  8:12 PM   Specimen: Nasopharyngeal Swab  Result Value Ref Range Status   SARS Coronavirus 2 NEGATIVE NEGATIVE Final    Comment: (NOTE) SARS-CoV-2 target nucleic acids are NOT DETECTED. The SARS-CoV-2 RNA is generally detectable in upper and lower respiratory specimens during the acute phase of infection. The lowest concentration of SARS-CoV-2 viral copies this assay can detect is 250 copies / mL. A negative result does not preclude SARS-CoV-2 infection and should not be used as the sole basis for treatment or other patient management decisions.  A negative result may occur with improper specimen collection / handling, submission of specimen other than nasopharyngeal swab, presence of viral mutation(s) within the areas targeted by this assay, and inadequate number of viral copies (<250 copies / mL). A negative result must be combined with clinical observations, patient history, and epidemiological information. Fact Sheet for Patients:   StrictlyIdeas.no Fact Sheet for Healthcare Providers: BankingDealers.co.za This test is not yet approved or cleared  by the [place] FDA and has been authorized for detection and/or diagnosis of SARS-CoV-2 by FDA under an Emergency Use Authorization (EUA).  This EUA will remain in effect (meaning this test can be used) for the duration of the COVID-19 declaration under Section 564(b)(1) of the Act, 21 U.S.C. section 360bbb-3(b)(1), unless the authorization is terminated or revoked sooner. Performed at [facility], [place] [address] [place], [place] [address]      Scheduled Meds: . aspirin  325 mg Oral Daily  . clopidogrel  75 mg Oral  Daily  . enoxaparin (LOVENOX) injection  40 mg Subcutaneous Q24H  . feeding supplement (ENSURE ENLIVE)  237 mL Oral BID BM  . sodium chloride flush  3 mL Intravenous Q12H   Continuous Infusions: . sodium chloride 125 mL/hr at 05/02/20 2220     LOS: 0 days   Cherene Altes, MD [facility] Office  [phone] Pager - Text Page per Shea Evans  If 7PM-7AM, please contact night-coverage per Amion 05/03/2020, 8:43 AM

## 2020-05-03 NOTE — Evaluation (Signed)
Physical Therapy Evaluation Patient Details Name: Sheila Parrish MRN: [id number] DOB: [birth year] Today's Date: 05/03/2020   History of Present Illness  86 y.o. female with medical history significant of orthostatic hypotension, dementia, history of CVA, ventricular bigeminy and previous near syncopal episodes who was brought in after sustaining is syncopal episode. Pt fell from chair when reaching for paper, striking her face against the ground. Pt with an episode of near syncope in the emergency room.  Clinical Impression  Pt presents to PT with significant history of falls and syncopal episodes. Syncopal episodes do not always coincide with change in position. Pt demonstrates deficits in strength, power, mobility, balance, and endurance. Evaluation is limited this session by orthostatic BP with change in position from supine to sitting. Pt with significant dementia and is unable to report symptoms. Due to significant history for syncopal episodes and recent falls PT defers attempts at standing or OOB mobility due to falls risk. Pt will continue to benefit from acute PT POC to reduce falls risk and improve mobility quality. PT currently recommending return to [facility] with continued 24/7 assistance and PT services. PT provides education on mobility recommendations to improve safety to pt's daughter which are outlined in general comments section.    Follow Up Recommendations Home health PT(return to [facility] with 24/7 assist and PT services)    Equipment Recommendations  None recommended by PT(pt and facility own necessary DME)    Recommendations for Other Services       Precautions / Restrictions Precautions Precautions: Fall Precaution Comments: syncopal episodes with and without position changes Restrictions Weight Bearing Restrictions: No      Mobility  Bed Mobility Overal bed mobility: Needs Assistance Bed Mobility: Supine to Sit;Sit to Supine     Supine to sit: Mod  assist;HOB elevated Sit to supine: Max assist      Transfers Overall transfer level: (deferred 2/2 orthostatic BP)                  Ambulation/Gait                Stairs            Wheelchair Mobility    Modified Rankin (Stroke Patients Only)       Balance Overall balance assessment: Needs assistance Sitting-balance support: Bilateral upper extremity supported;Feet supported Sitting balance-Leahy Scale: Fair Sitting balance - Comments: minG at edge of bed                                     Pertinent Vitals/Pain Pain Assessment: Faces Faces Pain Scale: Hurts little more Pain Location: L hip  Pain Descriptors / Indicators: Grimacing Pain Intervention(s): Monitored during session    Home Living Family/patient expects to be discharged to:: Assisted living               Home Equipment: Walker - 2 wheels;Wheelchair - manual(RW front wheels turn similar to [employer]) Additional Comments: Lives at [facility] and has 24/7 personal care attendant    Prior Function Level of Independence: Needs assistance   Gait / Transfers Assistance Needed: uses walker and requires assistance for all mobility  ADL's / Homemaking Assistance Needed: requires assist for ADL's  Comments: Has 24 hour CNA supervision/assist     Hand Dominance   Dominant Hand: Right    Extremity/Trunk Assessment   Upper Extremity Assessment Upper Extremity Assessment: Generalized weakness  Lower Extremity Assessment Lower Extremity Assessment: Generalized weakness    Cervical / Trunk Assessment Cervical / Trunk Assessment: Kyphotic  Communication   Communication: Expressive difficulties  Cognition Arousal/Alertness: Awake/alert Behavior During Therapy: Flat affect Overall Cognitive Status: History of cognitive impairments - at baseline                                 General Comments: pt with significant dementia, does respond to  her name but is unable to report her name. Daughter present and reports pt is near her baseline      General Comments General comments (skin integrity, edema, etc.): BP pre-mobility 176/85, BP after transitioning from supine to sitting 145/78, pt unable to report symptoms so PT elects to return to supine due to recent syncopal episodes. PT places pt in bed in chair mode with BP of 160/80. PT provides multiple suggestions for mobilioty safety to pt's daughter upopn return home including using lap belt to prevent falling out of chair or pulling chair directly up to table, utilzing pillows on sides of patient to prevent lateral falls. PT also suggests having a wheelchair follow for all ambulation to prevent falls to floor if further syncopal events occur with ambulation.    Exercises     Assessment/Plan    PT Assessment Patient needs continued PT services  PT Problem List Decreased strength;Decreased activity tolerance;Decreased balance;Decreased mobility;Decreased cognition;Decreased knowledge of use of DME;Decreased safety awareness;Decreased knowledge of precautions;Cardiopulmonary status limiting activity       PT Treatment Interventions DME instruction;Gait training;Functional mobility training;Therapeutic activities;Therapeutic exercise;Balance training;Neuromuscular re-education;Cognitive remediation;Patient/family education;Wheelchair mobility training    PT Goals (Current goals can be found in the Care Plan section)  Acute Rehab PT Goals Patient Stated Goal: To reduce falls risk PT Goal Formulation: With family Time For Goal Achievement: 05/17/20 Potential to Achieve Goals: Fair    Frequency Min 3X/week   Barriers to discharge        Co-evaluation               AM-PAC PT "6 Clicks" Mobility  Outcome Measure Help needed turning from your back to your side while in a flat bed without using bedrails?: A Lot Help needed moving from lying on your back to sitting on the side  of a flat bed without using bedrails?: A Lot Help needed moving to and from a bed to a chair (including a wheelchair)?: Total Help needed standing up from a chair using your arms (e.g., wheelchair or bedside chair)?: Total Help needed to walk in hospital room?: Total Help needed climbing 3-5 steps with a railing? : Total 6 Click Score: 8    End of Session   Activity Tolerance: Treatment limited secondary to medical complications (Comment)(orthostatic BP) Patient left: in bed;with call bell/phone within reach;with bed alarm set;with family/visitor present Nurse Communication: Mobility status PT Visit Diagnosis: Other abnormalities of gait and mobility (R26.89);Repeated falls (R29.6)    Time: [id number] PT Time Calculation (min) (ACUTE ONLY): 41 min   Charges:   PT Evaluation $PT Eval Moderate Complexity: 1 Mod PT Treatments $Self Care/Home Management: 8-22        Zenaida Niece, PT, DPT Acute Rehabilitation Pager: [phone]   Zenaida Niece 05/03/2020, 4:58 PM

## 2020-05-03 NOTE — ED Notes (Signed)
Called [facility] @ [phone], as well as Charge number listed [phone]) to give report.  No answer on either line.

## 2020-05-04 DIAGNOSIS — M2550 Pain in unspecified joint: Secondary | ICD-10-CM | POA: Diagnosis not present

## 2020-05-04 DIAGNOSIS — R410 Disorientation, unspecified: Secondary | ICD-10-CM | POA: Diagnosis not present

## 2020-05-04 DIAGNOSIS — R000 Tachycardia, unspecified: Secondary | ICD-10-CM | POA: Diagnosis not present

## 2020-05-04 DIAGNOSIS — Z7401 Bed confinement status: Secondary | ICD-10-CM | POA: Diagnosis not present

## 2020-05-04 DIAGNOSIS — R55 Syncope and collapse: Secondary | ICD-10-CM | POA: Diagnosis not present

## 2020-05-04 LAB — CBC
HCT: 34.8 % — ABNORMAL LOW (ref 36.0–46.0)
Hemoglobin: 10.9 g/dL — ABNORMAL LOW (ref 12.0–15.0)
MCH: 29.7 pg (ref 26.0–34.0)
MCHC: 31.3 g/dL (ref 30.0–36.0)
MCV: 94.8 fL (ref 80.0–100.0)
Platelets: 199 10*3/uL (ref 150–400)
RBC: 3.67 MIL/uL — ABNORMAL LOW (ref 3.87–5.11)
RDW: 13.6 % (ref 11.5–15.5)
WBC: 3.8 10*3/uL — ABNORMAL LOW (ref 4.0–10.5)
nRBC: 0 % (ref 0.0–0.2)

## 2020-05-04 LAB — T4, FREE: Free T4: 0.94 ng/dL (ref 0.61–1.12)

## 2020-05-04 LAB — TSH: TSH: 6.828 u[IU]/mL — ABNORMAL HIGH (ref 0.350–4.500)

## 2020-05-04 LAB — BASIC METABOLIC PANEL
Anion gap: 5 (ref 5–15)
BUN: 12 mg/dL (ref 8–23)
CO2: 25 mmol/L (ref 22–32)
Calcium: 8.3 mg/dL — ABNORMAL LOW (ref 8.9–10.3)
Chloride: 110 mmol/L (ref 98–111)
Creatinine, Ser: 0.67 mg/dL (ref 0.44–1.00)
GFR calc Af Amer: >60 mL/min (ref >60)
GFR calc non Af Amer: >60 mL/min (ref >60)
Glucose, Bld: 93 mg/dL (ref 70–99)
Potassium: 3.3 mmol/L — ABNORMAL LOW (ref 3.5–5.1)
Sodium: 140 mmol/L (ref 135–145)

## 2020-05-04 LAB — VITAMIN B12: Vitamin B-12: 294 pg/mL (ref 180–914)

## 2020-05-04 LAB — CORTISOL: Cortisol, Plasma: 17.1 ug/dL

## 2020-05-04 LAB — FOLATE: Folate: 8.7 ng/mL (ref >5.9)

## 2020-05-04 MED ORDER — CYANOCOBALAMIN 1000 MCG/ML IJ SOLN
1000.0000 ug | Freq: Once | INTRAMUSCULAR | Status: AC
  Administered 2020-05-04: 1000 ug via SUBCUTANEOUS
  Filled 2020-05-04: qty 1

## 2020-05-04 MED ORDER — POTASSIUM CHLORIDE CRYS ER 20 MEQ PO TBCR
40.0000 meq | EXTENDED_RELEASE_TABLET | Freq: Once | ORAL | Status: AC
  Administered 2020-05-04: 40 meq via ORAL
  Filled 2020-05-04: qty 2

## 2020-05-04 NOTE — Social Work (Addendum)
[facility] called and scheduled for 3pm transport per RN request.  Criss Alvine, LCSWA

## 2020-05-04 NOTE — Evaluation (Signed)
Occupational Therapy Evaluation Patient Details Name: Sheila Parrish MRN: [id number] DOB: 11-10-1927 Today's Date: 05/04/2020    History of Present Illness 86 y.o. female with medical history significant of orthostatic hypotension, dementia, history of CVA, ventricular bigeminy and previous near syncopal episodes who was brought in after sustaining is syncopal episode. Pt fell from chair when reaching for paper, striking her face against the ground. Pt with an episode of near syncope in the emergency room.   Clinical Impression   Pt PTA: Pt living in ILF with 24/7 care from caregivers (paid). Pt ambulatory with RW with swivel wheels in front and minA overall for ADL; caregivers assist with all ADL and IADL tasks due to dementia. Pt currently performing appears close to baseline functioning; orthostatic vitals were assessed and pt's BP stable. Pt's daughter present and reports this as her baseline. Pt minA overall for ADL and donning underwear in standing with support. Pt required cues to stand upright at EOB, but once pt started ambulating with RW, she was able to require only minguardA for stability. Pt able to answer occasional "yes/no" questions. Pt would benefit from continued OT skilled services for ADL, mobility and safety in post acute care setting. OT signing off today. BP stable; lying: 127/62; sitting 92/76; standing 132/56; sitting 127/63. O2 >94% on RA andHR 90-110 BPM      Follow Up Recommendations  Home health OT;Supervision/Assistance - 24 hour(return to [facility] with OT)    Equipment Recommendations  None recommended by OT    Recommendations for Other Services       Precautions / Restrictions Precautions Precautions: Fall Precaution Comments: check BP Restrictions Weight Bearing Restrictions: No      Mobility Bed Mobility Overal bed mobility: Needs Assistance Bed Mobility: Supine to Sit;Sit to Supine     Supine to sit: Min assist Sit to supine: Max  assist   General bed mobility comments: Pt pulling up on OTR for trunk elevation with assist to initiate LB to EOB; pt maxA for sitting to supine.  Transfers Overall transfer level: Needs assistance Equipment used: Rolling walker (2 wheeled) Transfers: Sit to/from [facility] Sit to Stand: Min guard Stand pivot transfers: Min guard       General transfer comment: for safety with RW; pt uses momentum     Balance Overall balance assessment: Needs assistance Sitting-balance support: Bilateral upper extremity supported;Feet supported Sitting balance-Leahy Scale: Fair Sitting balance - Comments: once at EOB, pt with fair balance   Standing balance support: Bilateral upper extremity supported;During functional activity Standing balance-Leahy Scale: Poor Standing balance comment: Reliant on RW for stability; able to donn pants by leaning on bed in standing.                           ADL either performed or assessed with clinical judgement   ADL Overall ADL's : Needs assistance/impaired Eating/Feeding: Set up;Sitting   Grooming: Min guard;Standing   Upper Body Bathing: Set up;Sitting   Lower Body Bathing: Minimal assistance;Sitting/lateral leans;Sit to/from stand   Upper Body Dressing : Set up;Sitting   Lower Body Dressing: Minimal assistance;Sitting/lateral leans;Sit to/from stand;Cueing for safety Lower Body Dressing Details (indicate cue type and reason): donning depends, pt requires assist for finding holes and pulling up to knees. Pt pulling up to waist in standing (leaning on bed). Toilet Transfer: Min guard;Ambulation;RW   Toileting- Clothing Manipulation and Hygiene: Minimal assistance;Sitting/lateral lean;Sit to/from stand       Functional  mobility during ADLs: Min guard;Rolling walker;Cueing for safety General ADL Comments: Pt appears close to baseline functioning; orthostatic vitals were assessed and pt's BP stable. Pt's daughter present  and reports this as her baseline.     Vision Baseline Vision/History: No visual deficits Patient Visual Report: No change from baseline Vision Assessment?: No apparent visual deficits     Perception     Praxis      Pertinent Vitals/Pain Pain Assessment: Faces Faces Pain Scale: No hurt Pain Intervention(s): Monitored during session     Hand Dominance Right   Extremity/Trunk Assessment Upper Extremity Assessment Upper Extremity Assessment: Generalized weakness   Lower Extremity Assessment Lower Extremity Assessment: Generalized weakness   Cervical / Trunk Assessment Cervical / Trunk Assessment: Kyphotic   Communication Communication Communication: Expressive difficulties   Cognition Arousal/Alertness: Awake/alert Behavior During Therapy: Flat affect Overall Cognitive Status: History of cognitive impairments - at baseline                                 General Comments: Pt with moderate dementia (per daughter-moderate); pt able to identify self and answer simple "yes/no" questions.   General Comments  BP stable; lying: 127/62; sitting 92/76; standing 132/56; sitting 127/63. O2 >94% on RA andHR 90-110 BPM    Exercises     Shoulder Instructions      Home Living Family/patient expects to be discharged to:: Assisted living Living Arrangements: Alone                           Home Equipment: Walker - 2 wheels;Wheelchair - manual   Additional Comments: Lives at [facility] and has 24/7 personal care attendant      Prior Functioning/Environment Level of Independence: Needs assistance  Gait / Transfers Assistance Needed: uses walker and requires assistance for all mobility ADL's / Homemaking Assistance Needed: requires assist for ADLs; IADLs provided for her. Pt has meal plan at facility- CGs bring her food from dining area.   Comments: Has 24 hour CNA supervision/assist        OT Problem List: Decreased strength;Decreased  activity tolerance;Impaired balance (sitting and/or standing);Decreased cognition;Decreased safety awareness;Pain;Increased edema;Impaired UE functional use;Decreased knowledge of use of DME or AE      OT Treatment/Interventions:      OT Goals(Current goals can be found in the care plan section)    OT Frequency:     Barriers to D/C:            Co-evaluation              AM-PAC OT "6 Clicks" Daily Activity     Outcome Measure Help from another person eating meals?: A Little Help from another person taking care of personal grooming?: A Little Help from another person toileting, which includes using toliet, bedpan, or urinal?: A Little Help from another person bathing (including washing, rinsing, drying)?: A Little Help from another person to put on and taking off regular upper body clothing?: A Little Help from another person to put on and taking off regular lower body clothing?: A Little 6 Click Score: 18   End of Session Equipment Utilized During Treatment: Gait belt;Rolling walker Nurse Communication: Mobility status  Activity Tolerance: Patient tolerated treatment well Patient left: in bed;with call bell/phone within reach;with bed alarm set;with family/visitor present  OT Visit Diagnosis: Unsteadiness on feet (R26.81);Muscle weakness (generalized) (M62.81);Other symptoms and signs involving  cognitive function                Time: [id number] OT Time Calculation (min): 33 min Charges:  OT General Charges $OT Visit: 1 Visit OT Evaluation $OT Eval Moderate Complexity: 1 Mod OT Treatments $Self Care/Home Management : 8-22 mins  Jefferey Pica, OTR/L Acute Rehabilitation Services Pager: [phone] Office: [phone] C 05/04/2020, 2:01 PM

## 2020-05-04 NOTE — Discharge Instructions (Signed)
Orthostatic Hypotension Blood pressure is a measurement of how strongly, or weakly, your blood is pressing against the walls of your arteries. Orthostatic hypotension is a sudden drop in blood pressure that happens when you quickly change positions, such as when you get up from sitting or lying down. Arteries are blood vessels that carry blood from your heart throughout your body. When blood pressure is too low, you may not get enough blood to your brain or to the rest of your organs. This can cause weakness, light-headedness, rapid heartbeat, and fainting. This can last for just a few seconds or for up to a few minutes. Orthostatic hypotension is usually not a serious problem. However, if it happens frequently or gets worse, it may be a sign of something more serious. What are the causes? This condition may be caused by:  Sudden changes in posture, such as standing up quickly after you have been sitting or lying down.  Blood loss.  Loss of body fluids (dehydration).  Heart problems.  Hormone (endocrine) problems.  Pregnancy.  Severe infection.  Lack of certain nutrients.  Severe allergic reactions (anaphylaxis).  Certain medicines, such as blood pressure medicine or medicines that make the body lose excess fluids (diuretics). Sometimes, this condition can be caused by not taking medicine as directed, such as taking too much of a certain medicine. What increases the risk? The following factors may make you more likely to develop this condition:  Age. Risk increases as you get older.  Conditions that affect the heart or the central nervous system.  Taking certain medicines, such as blood pressure medicine or diuretics.  Being pregnant. What are the signs or symptoms? Symptoms of this condition may include:  Weakness.  Light-headedness.  Dizziness.  Blurred vision.  Fatigue.  Rapid heartbeat.  Fainting, in severe cases. How is this diagnosed? This condition is  diagnosed based on:  Your medical history.  Your symptoms.  Your blood pressure measurement. Your health care provider will check your blood pressure when you are: ? Lying down. ? Sitting. ? Standing. A blood pressure reading is recorded as two numbers, such as "120 over 80" (or 120/80). The first ("top") number is called the systolic pressure. It is a measure of the pressure in your arteries as your heart beats. The second ("bottom") number is called the diastolic pressure. It is a measure of the pressure in your arteries when your heart relaxes between beats. Blood pressure is measured in a unit called mm Hg. Healthy blood pressure for most adults is 120/80. If your blood pressure is below 90/60, you may be diagnosed with hypotension. Other information or tests that may be used to diagnose orthostatic hypotension include:  Your other vital signs, such as your heart rate and temperature.  Blood tests.  Tilt table test. For this test, you will be safely secured to a table that moves you from a lying position to an upright position. Your heart rhythm and blood pressure will be monitored during the test. How is this treated? This condition may be treated by:  Changing your diet. This may involve eating more salt (sodium) or drinking more water.  Taking medicines to raise your blood pressure.  Changing the dosage of certain medicines you are taking that might be lowering your blood pressure.  Wearing compression stockings. These stockings help to prevent blood clots and reduce swelling in your legs. In some cases, you may need to go to the hospital for:  Fluid replacement. This means you will   receive fluids through an IV.  Blood replacement. This means you will receive donated blood through an IV (transfusion).  Treating an infection or heart problems, if this applies.  Monitoring. You may need to be monitored while medicines that you are taking wear off. Follow these instructions  at home: Eating and drinking   Drink enough fluid to keep your urine pale yellow.  Eat a healthy diet, and follow instructions from your health care provider about eating or drinking restrictions. A healthy diet includes: ? Fresh fruits and vegetables. ? Whole grains. ? Lean meats. ? Low-fat dairy products.  Eat extra salt only as directed. Do not add extra salt to your diet unless your health care provider told you to do that.  Eat frequent, small meals.  Avoid standing up suddenly after eating. Medicines  Take over-the-counter and prescription medicines only as told by your health care provider. ? Follow instructions from your health care provider about changing the dosage of your current medicines, if this applies. ? Do not stop or adjust any of your medicines on your own. General instructions   Wear compression stockings as told by your health care provider.  Get up slowly from lying down or sitting positions. This gives your blood pressure a chance to adjust.  Avoid hot showers and excessive heat as directed by your health care provider.  Return to your normal activities as told by your health care provider. Ask your health care provider what activities are safe for you.  Do not use any products that contain nicotine or tobacco, such as cigarettes, e-cigarettes, and chewing tobacco. If you need help quitting, ask your health care provider.  Keep all follow-up visits as told by your health care provider. This is important. Contact a health care provider if you:  Vomit.  Have diarrhea.  Have a fever for more than 2-3 days.  Feel more thirsty than usual.  Feel weak and tired. Get help right away if you:  Have chest pain.  Have a fast or irregular heartbeat.  Develop numbness in any part of your body.  Cannot move your arms or your legs.  Have trouble speaking.  Become sweaty or feel light-headed.  Faint.  Feel short of breath.  Have trouble staying  awake.  Feel confused. Summary  Orthostatic hypotension is a sudden drop in blood pressure that happens when you quickly change positions.  Orthostatic hypotension is usually not a serious problem.  It is diagnosed by having your blood pressure taken lying down, sitting, and then standing.  It may be treated by changing your diet or adjusting your medicines. This information is not intended to replace advice given to you by your health care provider. Make sure you discuss any questions you have with your health care provider. Document Revised: 05/25/2018 Document Reviewed: 05/25/2018 Elsevier Patient Education  [place].     Facial or Scalp Contusion  A facial or scalp contusion is a deep bruise (contusion) on the face or head. Bruises happen when an injury causes bleeding under the skin. The bruise may turn blue, purple, or yellow. Minor injuries will cause a bruise that is not painful, but worse bruises can stay painful and swollen for a few weeks. Follow these instructions at home:  Take over-the-counter and prescription medicines only as told by your doctor.  If directed, apply ice to the injured area. ? Put ice in a plastic bag. ? Place a towel between your skin and the bag. ? Leave the  ice on for 20 minutes, 2-3 times a day.  Keep all follow-up visits as told by your doctor. This is important. Contact a doctor if:  You have trouble biting or chewing.  Your pain or swelling gets worse.  You have pain when you move your eyes. Get help right away if:  You have very bad pain or a headache, and medicine does not help.  You are very tired or confused, or your personality changes.  You throw up (vomit).  You have a nosebleed that does not stop.  You see two of everything (double vision) or have blurry vision.  You have clear fluid coming from your nose or ear, and it does not go away.  You have problems walking or using your arms or legs.  You are very  dizzy. Summary  A facial or scalp contusion is a deep bruise (contusion) on the face or head.  Bruises happen when an injury causes bleeding under the skin.  Minor injuries will cause a bruise that is not painful, but worse bruises can stay painful and swollen for a few weeks. This information is not intended to replace advice given to you by your health care provider. Make sure you discuss any questions you have with your health care provider. Document Revised: 11/11/2017 Document Reviewed: 10/19/2016 Elsevier Patient Education  [place].  Syncope  Syncope refers to a condition in which a person temporarily loses consciousness. Syncope may also be called fainting or passing out. It is caused by a sudden decrease in blood flow to the brain. Even though most causes of syncope are not dangerous, syncope can be a sign of a serious medical problem. Your health care provider may do tests to find the reason why you are having syncope. Signs that you may be about to faint include:  Feeling dizzy or light-headed.  Feeling nauseous.  Seeing all white or all black in your field of vision.  Having cold, clammy skin. If you faint, get medical help right away. Call your local emergency services (911 in the [place] Do not drive yourself to the hospital. Follow these instructions at home: Pay attention to any changes in your symptoms. Take these actions to stay safe and to help relieve your symptoms: Lifestyle  Do not drive, use machinery, or play sports until your health care provider says it is okay.  Do not drink alcohol.  Do not use any products that contain nicotine or tobacco, such as cigarettes and e-cigarettes. If you need help quitting, ask your health care provider.  Drink enough fluid to keep your urine pale yellow. General instructions  Take over-the-counter and prescription medicines only as told by your health care provider.  If you are taking blood pressure or heart  medicine, get up slowly and take several minutes to sit and then stand. This can reduce dizziness or light-headedness.  Have someone stay with you until you feel stable.  If you start to feel like you might faint, lie down right away and raise (elevate) your feet above the level of your heart. Breathe deeply and steadily. Wait until all the symptoms have passed.  Keep all follow-up visits as told by your health care provider. This is important. Get help right away if you:  Have a severe headache.  Faint once or repeatedly.  Have pain in your chest, abdomen, or back.  Have a very fast or irregular heartbeat (palpitations).  Have pain when you breathe.  Are bleeding from your mouth or  rectum, or you have black or tarry stool.  Have a seizure.  Are confused.  Have trouble walking.  Have severe weakness.  Have vision problems. These symptoms may represent a serious problem that is an emergency. Do not wait to see if your symptoms will go away. Get medical help right away. Call your local emergency services (911 in the [place] Do not drive yourself to the hospital. Summary  Syncope refers to a condition in which a person temporarily loses consciousness. It is caused by a sudden decrease in blood flow to the brain.  Signs that you may be about to faint include dizziness, feeling light-headed, feeling nauseous, sudden vision changes, or cold, clammy skin.  Although most causes of syncope are not dangerous, syncope can be a sign of a serious medical problem. If you faint, get medical help right away. This information is not intended to replace advice given to you by your health care provider. Make sure you discuss any questions you have with your health care provider. Document Revised: 11/11/2017 Document Reviewed: 11/07/2017 Elsevier Patient Education  2020 [employer].

## 2020-05-04 NOTE — Discharge Summary (Signed)
DISCHARGE SUMMARY  Sheila Parrish  MR#: [id number]  DOB:Sep 02, 1927  Date of Admission: 05/02/2020 Date of Discharge: 05/04/2020  Attending Physician:Glenys Snader Hennie Duos, MD  Patient's AZ:[id number], Sheila Reichmann, MD  Consults: none  Disposition: D/C home w/ 24hr assistance/supervision   Follow-up Appts: Follow-up Information    Lavone Orn, MD Follow up in 2 week(s).   Specialty: Internal Medicine Contact information: [address] [place] [address] [phone]        Sueanne Margarita, MD .   Specialty: Cardiology Contact information: [phone] [place] [address] [phone]           Tests Needing Follow-up: -f/u on free T3/T4 pending at time of d/c  -recheck B12 level in 6-8 weeks   Discharge Diagnoses: Recurrent syncopal spells History of orthostatic hypotension B12 deficiency  Dementia History of embolic CVA R CR March 123XX123 HTN  Initial presentation: 86 year old with a history of recurrent syncopal spells, orthostatic hypotension, dementia, CVA, and HTN who was brought to the ED after suffering a syncopal spell. Reportedly patient was sitting in a chair reading a paper, reached to turn the page, and then suffered a syncopal spell striking her face against the ground. She quickly recovered consciousness. In the ED she was found to be orthostatic. While being ambulated with a walker in the ED she suffered another syncopal spell.  Hospital Course:  Recurrent syncopal spells Has previously undergone extensive work-up and is followed by Cardiology/EP for same - etiology not elucidated during this hospital stay - low grade DH in setting of orthostatic hypotension likely contributing, but initial spell this time not associated w/ postural change - PT/OT to continue at her independent living facility  History of orthostatic hypotension Cortisol normal - counseled pt and daughter on need for slow postural changes - likely  related to advanced age and decreased blood vessel compliance - need to avoid dehydration if possible - permissive hypertension to be practiced   Dementia No agitation encountered during this admission - has 24/7 care in her home  B12 deficiency  B12 level noted to be sub-optimal at < 300 - dosed w/ 1089mcg SQ x1 during admit - f/u B12 level as outpt in 6-8 weeks to determine if ongoing supplementation indicated - likely due to poor intake   History of embolic CVA R CR March 123XX123 Suspicion was for occult atrial fibrillation - no arrhythmia noted during this admit   HTN Practice permissive HTN given advanced age and propensity for syncope   Allergies as of 05/04/2020      Reactions   Donepezil Nausea And Vomiting   Alendronate Other (See Comments)   Difficulty swallowing   Atorvastatin Other (See Comments)   Leg cramping   Bactrim [sulfamethoxazole-trimethoprim] Nausea And Vomiting   Macrodantin [nitrofurantoin Macrocrystal] Other (See Comments)   Unknown reaction   Simvastatin Other (See Comments)   Leg cramping   Sulfa Antibiotics Nausea And Vomiting   Azithromycin Rash      Medication List    TAKE these medications   aspirin 325 MG tablet Take 1 tablet (325 mg total) by mouth daily.   clopidogrel 75 MG tablet Commonly known as: PLAVIX Take 1 tablet (75 mg total) by mouth daily.   traMADol 50 MG tablet Commonly known as: ULTRAM Take 1 tablet (50 mg total) by mouth every 6 (six) hours as needed for moderate pain (facial contusion).       Day of Discharge BP 131/63 (BP Location: Right  Arm)   Pulse 78   Temp 97.8 F (36.6 C)   Resp 20   Ht 4\' 11"  (1.499 m)   Wt 57.5 kg   SpO2 97%   BMI 25.60 kg/m   Physical Exam: General: No acute respiratory distress - facial edema and bruising has improved/evolved, w/ no fluctuance or discharge on day of d/c  Lungs: Clear to auscultation bilaterally without wheezes or crackles Cardiovascular: Regular rate and rhythm  without murmur gallop or rub normal S1 and S2 Abdomen: Nontender, nondistended, soft, bowel sounds positive, no rebound, no ascites, no appreciable mass Extremities: No significant cyanosis, clubbing, or edema bilateral lower extremities  Basic Metabolic Panel: Recent Labs  Lab 05/02/20 1709 05/03/20 0456 05/04/20 0634  NA 137 140 140  K 3.6 3.6 3.3*  CL 103 106 110  CO2 22 23 25   GLUCOSE 125* 106* 93  BUN 13 14 12   CREATININE 0.79 0.71 0.67  CALCIUM 9.2 8.9 8.3*    Liver Function Tests: Recent Labs  Lab 05/02/20 1709 05/03/20 0456  AST 28 40  ALT 16 20  ALKPHOS 59 52  BILITOT 0.9 0.5  PROT 6.6 6.3*  ALBUMIN 3.7 3.6    CBC: Recent Labs  Lab 05/02/20 1709 05/03/20 0456 05/04/20 0634  WBC 10.7* 7.1 3.8*  NEUTROABS 8.4*  --   --   HGB 13.3 12.9 10.9*  HCT 41.9 40.3 34.8*  MCV 95.4 94.2 94.8  PLT 259 221 199    CBG: Recent Labs  Lab 05/02/20 1725  GLUCAP 81    Recent Results (from the past 240 hour(s))  SARS Coronavirus 2 by RT PCR (hospital order, performed in [place] hospital lab) Nasopharyngeal Nasopharyngeal Swab     Status: None   Collection Time: 05/02/20  8:12 PM   Specimen: Nasopharyngeal Swab  Result Value Ref Range Status   SARS Coronavirus 2 NEGATIVE NEGATIVE Final    Comment: (NOTE) SARS-CoV-2 target nucleic acids are NOT DETECTED. The SARS-CoV-2 RNA is generally detectable in upper and lower respiratory specimens during the acute phase of infection. The lowest concentration of SARS-CoV-2 viral copies this assay can detect is 250 copies / mL. A negative result does not preclude SARS-CoV-2 infection and should not be used as the sole basis for treatment or other patient management decisions.  A negative result may occur with improper specimen collection / handling, submission of specimen other than nasopharyngeal swab, presence of viral mutation(s) within the areas targeted by this assay, and inadequate number of viral copies (<250  copies / mL). A negative result must be combined with clinical observations, patient history, and epidemiological information. Fact Sheet for Patients:   StrictlyIdeas.no Fact Sheet for Healthcare Providers: BankingDealers.co.za This test is not yet approved or cleared  by the [place] FDA and has been authorized for detection and/or diagnosis of SARS-CoV-2 by FDA under an Emergency Use Authorization (EUA).  This EUA will remain in effect (meaning this test can be used) for the duration of the COVID-19 declaration under Section 564(b)(1) of the Act, 21 U.S.C. section 360bbb-3(b)(1), unless the authorization is terminated or revoked sooner. Performed at [facility], [place] [address] [place], [place] [address]       Time spent in discharge (includes decision making & examination of pt): 30 minutes  05/04/2020, 10:54 AM   Cherene Altes, MD [facility] Office  [phone]

## 2020-05-05 LAB — CUP PACEART REMOTE DEVICE CHECK
Date Time Interrogation Session: 20210523075817
Implantable Pulse Generator Implant Date: 20210322

## 2020-05-05 LAB — T3, FREE: T3, Free: 1.9 pg/mL — ABNORMAL LOW (ref 2.0–4.4)

## 2020-05-06 ENCOUNTER — Ambulatory Visit (INDEPENDENT_AMBULATORY_CARE_PROVIDER_SITE_OTHER): Payer: Medicare PPO | Admitting: *Deleted

## 2020-05-06 DIAGNOSIS — R55 Syncope and collapse: Secondary | ICD-10-CM | POA: Diagnosis not present

## 2020-05-06 NOTE — Progress Notes (Signed)
Carelink Summary Report / Loop Recorder 

## 2020-05-08 ENCOUNTER — Other Ambulatory Visit: Payer: Medicare PPO

## 2020-05-14 DIAGNOSIS — Z20828 Contact with and (suspected) exposure to other viral communicable diseases: Secondary | ICD-10-CM | POA: Diagnosis not present

## 2020-05-14 DIAGNOSIS — Z1159 Encounter for screening for other viral diseases: Secondary | ICD-10-CM | POA: Diagnosis not present

## 2020-05-17 ENCOUNTER — Inpatient Hospital Stay (HOSPITAL_COMMUNITY)
Admission: EM | Admit: 2020-05-17 | Discharge: 2020-05-22 | DRG: 312 | Disposition: A | Discharge status: Skilled Nursing Facility | Payer: Medicare PPO | Source: Skilled Nursing Facility | Attending: Internal Medicine | Admitting: Internal Medicine

## 2020-05-17 ENCOUNTER — Emergency Department (HOSPITAL_COMMUNITY): Payer: Medicare PPO

## 2020-05-17 ENCOUNTER — Encounter (HOSPITAL_COMMUNITY): Payer: Self-pay

## 2020-05-17 ENCOUNTER — Other Ambulatory Visit: Payer: Self-pay

## 2020-05-17 DIAGNOSIS — I110 Hypertensive heart disease with heart failure: Secondary | ICD-10-CM | POA: Diagnosis present

## 2020-05-17 DIAGNOSIS — R296 Repeated falls: Secondary | ICD-10-CM | POA: Diagnosis present

## 2020-05-17 DIAGNOSIS — R9431 Abnormal electrocardiogram [ECG] [EKG]: Secondary | ICD-10-CM | POA: Diagnosis not present

## 2020-05-17 DIAGNOSIS — Z823 Family history of stroke: Secondary | ICD-10-CM

## 2020-05-17 DIAGNOSIS — D649 Anemia, unspecified: Secondary | ICD-10-CM | POA: Diagnosis present

## 2020-05-17 DIAGNOSIS — Z8261 Family history of arthritis: Secondary | ICD-10-CM

## 2020-05-17 DIAGNOSIS — K922 Gastrointestinal hemorrhage, unspecified: Secondary | ICD-10-CM

## 2020-05-17 DIAGNOSIS — R402421 Glasgow coma scale score 9-12, in the field [EMT or ambulance]: Secondary | ICD-10-CM | POA: Diagnosis not present

## 2020-05-17 DIAGNOSIS — J939 Pneumothorax, unspecified: Secondary | ICD-10-CM | POA: Diagnosis not present

## 2020-05-17 DIAGNOSIS — R4020 Unspecified coma: Secondary | ICD-10-CM | POA: Diagnosis not present

## 2020-05-17 DIAGNOSIS — Z7982 Long term (current) use of aspirin: Secondary | ICD-10-CM | POA: Diagnosis not present

## 2020-05-17 DIAGNOSIS — R0989 Other specified symptoms and signs involving the circulatory and respiratory systems: Secondary | ICD-10-CM

## 2020-05-17 DIAGNOSIS — I493 Ventricular premature depolarization: Secondary | ICD-10-CM | POA: Diagnosis present

## 2020-05-17 DIAGNOSIS — R339 Retention of urine, unspecified: Secondary | ICD-10-CM | POA: Diagnosis present

## 2020-05-17 DIAGNOSIS — G20 Parkinson's disease: Secondary | ICD-10-CM | POA: Diagnosis not present

## 2020-05-17 DIAGNOSIS — Z888 Allergy status to other drugs, medicaments and biological substances status: Secondary | ICD-10-CM | POA: Diagnosis not present

## 2020-05-17 DIAGNOSIS — Z882 Allergy status to sulfonamides status: Secondary | ICD-10-CM

## 2020-05-17 DIAGNOSIS — S0083XA Contusion of other part of head, initial encounter: Secondary | ICD-10-CM | POA: Diagnosis present

## 2020-05-17 DIAGNOSIS — R55 Syncope and collapse: Secondary | ICD-10-CM | POA: Diagnosis not present

## 2020-05-17 DIAGNOSIS — D1800 Hemangioma unspecified site: Secondary | ICD-10-CM

## 2020-05-17 DIAGNOSIS — Z8673 Personal history of transient ischemic attack (TIA), and cerebral infarction without residual deficits: Secondary | ICD-10-CM | POA: Diagnosis not present

## 2020-05-17 DIAGNOSIS — Z20822 Contact with and (suspected) exposure to covid-19: Secondary | ICD-10-CM | POA: Diagnosis not present

## 2020-05-17 DIAGNOSIS — R404 Transient alteration of awareness: Secondary | ICD-10-CM | POA: Diagnosis not present

## 2020-05-17 DIAGNOSIS — E7800 Pure hypercholesterolemia, unspecified: Secondary | ICD-10-CM | POA: Diagnosis present

## 2020-05-17 DIAGNOSIS — Z66 Do not resuscitate: Secondary | ICD-10-CM | POA: Diagnosis present

## 2020-05-17 DIAGNOSIS — Z7401 Bed confinement status: Secondary | ICD-10-CM | POA: Diagnosis not present

## 2020-05-17 DIAGNOSIS — F0390 Unspecified dementia without behavioral disturbance: Secondary | ICD-10-CM | POA: Diagnosis not present

## 2020-05-17 DIAGNOSIS — I5032 Chronic diastolic (congestive) heart failure: Secondary | ICD-10-CM | POA: Diagnosis present

## 2020-05-17 DIAGNOSIS — Z881 Allergy status to other antibiotic agents status: Secondary | ICD-10-CM | POA: Diagnosis not present

## 2020-05-17 DIAGNOSIS — G909 Disorder of the autonomic nervous system, unspecified: Secondary | ICD-10-CM | POA: Diagnosis not present

## 2020-05-17 DIAGNOSIS — R4182 Altered mental status, unspecified: Secondary | ICD-10-CM | POA: Diagnosis not present

## 2020-05-17 DIAGNOSIS — Z7902 Long term (current) use of antithrombotics/antiplatelets: Secondary | ICD-10-CM | POA: Diagnosis not present

## 2020-05-17 DIAGNOSIS — Z96652 Presence of left artificial knee joint: Secondary | ICD-10-CM | POA: Diagnosis present

## 2020-05-17 DIAGNOSIS — F0281 Dementia in other diseases classified elsewhere with behavioral disturbance: Secondary | ICD-10-CM | POA: Diagnosis not present

## 2020-05-17 DIAGNOSIS — M2550 Pain in unspecified joint: Secondary | ICD-10-CM | POA: Diagnosis not present

## 2020-05-17 DIAGNOSIS — R195 Other fecal abnormalities: Secondary | ICD-10-CM | POA: Diagnosis not present

## 2020-05-17 DIAGNOSIS — D509 Iron deficiency anemia, unspecified: Secondary | ICD-10-CM | POA: Diagnosis not present

## 2020-05-17 DIAGNOSIS — R54 Age-related physical debility: Secondary | ICD-10-CM | POA: Diagnosis present

## 2020-05-17 DIAGNOSIS — R0902 Hypoxemia: Secondary | ICD-10-CM | POA: Diagnosis not present

## 2020-05-17 DIAGNOSIS — I951 Orthostatic hypotension: Secondary | ICD-10-CM | POA: Diagnosis not present

## 2020-05-17 DIAGNOSIS — R2241 Localized swelling, mass and lump, right lower limb: Secondary | ICD-10-CM | POA: Diagnosis not present

## 2020-05-17 DIAGNOSIS — I999 Unspecified disorder of circulatory system: Secondary | ICD-10-CM | POA: Diagnosis present

## 2020-05-17 DIAGNOSIS — I959 Hypotension, unspecified: Secondary | ICD-10-CM | POA: Diagnosis not present

## 2020-05-17 DIAGNOSIS — R030 Elevated blood-pressure reading, without diagnosis of hypertension: Secondary | ICD-10-CM | POA: Diagnosis not present

## 2020-05-17 LAB — CBC WITH DIFFERENTIAL/PLATELET
Abs Immature Granulocytes: 0.04 10*3/uL (ref 0.00–0.07)
Basophils Absolute: 0.1 10*3/uL (ref 0.0–0.1)
Basophils Relative: 1 %
Eosinophils Absolute: 0.1 10*3/uL (ref 0.0–0.5)
Eosinophils Relative: 1 %
HCT: 34.3 % — ABNORMAL LOW (ref 36.0–46.0)
Hemoglobin: 10.8 g/dL — ABNORMAL LOW (ref 12.0–15.0)
Immature Granulocytes: 1 %
Lymphocytes Relative: 7 %
Lymphs Abs: 0.6 10*3/uL — ABNORMAL LOW (ref 0.7–4.0)
MCH: 29.8 pg (ref 26.0–34.0)
MCHC: 31.5 g/dL (ref 30.0–36.0)
MCV: 94.8 fL (ref 80.0–100.0)
Monocytes Absolute: 0.5 10*3/uL (ref 0.1–1.0)
Monocytes Relative: 5 %
Neutro Abs: 7.2 10*3/uL (ref 1.7–7.7)
Neutrophils Relative %: 85 %
Platelets: 408 10*3/uL — ABNORMAL HIGH (ref 150–400)
RBC: 3.62 MIL/uL — ABNORMAL LOW (ref 3.87–5.11)
RDW: 13.2 % (ref 11.5–15.5)
WBC: 8.5 10*3/uL (ref 4.0–10.5)
nRBC: 0 % (ref 0.0–0.2)

## 2020-05-17 LAB — COMPREHENSIVE METABOLIC PANEL
ALT: 10 U/L (ref 0–44)
AST: 16 U/L (ref 15–41)
Albumin: 3.2 g/dL — ABNORMAL LOW (ref 3.5–5.0)
Alkaline Phosphatase: 59 U/L (ref 38–126)
Anion gap: 9 (ref 5–15)
BUN: 17 mg/dL (ref 8–23)
CO2: 24 mmol/L (ref 22–32)
Calcium: 8.8 mg/dL — ABNORMAL LOW (ref 8.9–10.3)
Chloride: 105 mmol/L (ref 98–111)
Creatinine, Ser: 0.76 mg/dL (ref 0.44–1.00)
GFR calc Af Amer: >60 mL/min (ref >60)
GFR calc non Af Amer: >60 mL/min (ref >60)
Glucose, Bld: 109 mg/dL — ABNORMAL HIGH (ref 70–99)
Potassium: 3.6 mmol/L (ref 3.5–5.1)
Sodium: 138 mmol/L (ref 135–145)
Total Bilirubin: 0.6 mg/dL (ref 0.3–1.2)
Total Protein: 5.9 g/dL — ABNORMAL LOW (ref 6.5–8.1)

## 2020-05-17 LAB — HEMOGLOBIN AND HEMATOCRIT, BLOOD
HCT: 33.2 % — ABNORMAL LOW (ref 36.0–46.0)
Hemoglobin: 10.8 g/dL — ABNORMAL LOW (ref 12.0–15.0)

## 2020-05-17 LAB — TYPE AND SCREEN
ABO/RH(D): A NEG
Antibody Screen: NEGATIVE

## 2020-05-17 LAB — SARS CORONAVIRUS 2 BY RT PCR (HOSPITAL ORDER, PERFORMED IN ~~LOC~~ HOSPITAL LAB): SARS Coronavirus 2: NEGATIVE

## 2020-05-17 LAB — CBG MONITORING, ED: Glucose-Capillary: 86 mg/dL (ref 70–99)

## 2020-05-17 LAB — PROTIME-INR
INR: 1.1 (ref 0.8–1.2)
Prothrombin Time: 14.2 seconds (ref 11.4–15.2)

## 2020-05-17 LAB — POC OCCULT BLOOD, ED: Fecal Occult Bld: POSITIVE — AB

## 2020-05-17 MED ORDER — PANTOPRAZOLE SODIUM 40 MG IV SOLR
40.0000 mg | Freq: Two times a day (BID) | INTRAVENOUS | Status: DC
  Administered 2020-05-17 – 2020-05-18 (×3): 40 mg via INTRAVENOUS
  Filled 2020-05-17 (×4): qty 40

## 2020-05-17 MED ORDER — PANTOPRAZOLE SODIUM 40 MG IV SOLR
40.0000 mg | Freq: Once | INTRAVENOUS | Status: AC
  Administered 2020-05-17: 40 mg via INTRAVENOUS
  Filled 2020-05-17: qty 40

## 2020-05-17 MED ORDER — ACETAMINOPHEN 500 MG PO TABS
500.0000 mg | ORAL_TABLET | Freq: Four times a day (QID) | ORAL | Status: DC | PRN
  Administered 2020-05-19: 500 mg via ORAL
  Filled 2020-05-17: qty 1

## 2020-05-17 MED ORDER — ENOXAPARIN SODIUM 40 MG/0.4ML ~~LOC~~ SOLN
40.0000 mg | SUBCUTANEOUS | Status: DC
  Administered 2020-05-17: 40 mg via SUBCUTANEOUS
  Filled 2020-05-17: qty 0.4

## 2020-05-17 MED ORDER — HYDRALAZINE HCL 25 MG PO TABS: 25.0000 mg | ORAL_TABLET | Freq: Four times a day (QID) | ORAL | Status: DC | PRN

## 2020-05-17 MED ORDER — ASPIRIN 325 MG PO TABS
325.0000 mg | ORAL_TABLET | Freq: Every day | ORAL | Status: DC
  Administered 2020-05-18 – 2020-05-22 (×5): 325 mg via ORAL
  Filled 2020-05-17 (×6): qty 1

## 2020-05-17 MED ORDER — MENTHOL (TOPICAL ANALGESIC) 2 % EX GEL: Freq: Every day | CUTANEOUS | Status: DC

## 2020-05-17 MED ORDER — SODIUM CHLORIDE 0.9% FLUSH
3.0000 mL | Freq: Two times a day (BID) | INTRAVENOUS | Status: DC
  Administered 2020-05-18 – 2020-05-22 (×7): 3 mL via INTRAVENOUS

## 2020-05-17 MED ORDER — SODIUM CHLORIDE 0.9 % IV SOLN: INTRAVENOUS | Status: AC

## 2020-05-17 MED ORDER — FLUDROCORTISONE ACETATE 0.1 MG PO TABS
0.1000 mg | ORAL_TABLET | Freq: Every day | ORAL | Status: DC
  Administered 2020-05-18: 0.1 mg via ORAL
  Filled 2020-05-17: qty 1

## 2020-05-17 NOTE — H&P (Signed)
History and Physical    Sheila Parrish [id number] DOB: 1927/04/26 DOA: 05/17/2020  PCP: Lavone Orn, MD (Confirm with patient/family/NH records and if not entered, this has to be entered at [facility] point of entry) Patient coming from: SNF  I have personally briefly reviewed patient's old medical records in [place]  Chief Complaint: Syncope  HPI: Sheila Parrish is a 86 y.o. female with medical history significant of recurrent syncopal spells, orthostatic hypotension, dementia, CVA, andHTNpresented to ED after suffering a syncopal spell. Pt was admitted to hospital two weeks ago for same reason. Pt daughter at bedside who talked to pt's caregiver and who reported this happened while patient was sitting watching TV. Pt seemed to recover consciousness after about 20-30 min estimated by daughter.  ED Course: In the ED she was found to be orthostatic with initial BP 70/50. But BP rebounded quickly to over 150s SBP. ED also did a FOBT which came back positive.  Review of Systems: Unable to perform, pt demented   Past Medical History:  Diagnosis Date  . High cholesterol   . Scarlet fever 1936   Hospitalized for a month  . Stroke (cerebrum) ([place])   . Syncope     Past Surgical History:  Procedure Laterality Date  . ABDOMINAL HYSTERECTOMY    . implantable loop recorder placement  03/03/2020   Medtronic Reveal Linq model [place] 11(SN [id number]) implanted for cryptogenic stroke  . TOTAL KNEE ARTHROPLASTY Left 2010     reports that she has never smoked. She has never used smokeless tobacco. She reports that she does not drink alcohol or use drugs.  Allergies  Allergen Reactions  . Donepezil Nausea And Vomiting  . Alendronate Other (See Comments)    Difficulty swallowing  . Atorvastatin Other (See Comments)    Leg cramping  . Bactrim [Sulfamethoxazole-Trimethoprim] Nausea And Vomiting  . Macrodantin [Nitrofurantoin Macrocrystal] Other (See Comments)    Unknown reaction  .  Simvastatin Other (See Comments)    Leg cramping  . Sulfa Antibiotics Nausea And Vomiting  . Azithromycin Rash    Family History  Problem Relation Age of Onset  . Arthritis Mother        Died at 70  . Stroke Father 56       Died at age 37  . CAD Neg Hx      Prior to Admission medications   Medication Sig Start Date End Date Taking? Authorizing Provider  acetaminophen (TYLENOL) 500 MG tablet Take 500 mg by mouth every 6 (six) hours as needed for headache (pain).   Yes [provider]  aspirin 325 MG tablet Take 1 tablet (325 mg total) by mouth daily. 03/02/20  Yes Swayze, Ava, DO  clopidogrel (PLAVIX) 75 MG tablet Take 1 tablet (75 mg total) by mouth daily. 04/02/20  Yes Allred, Jeneen Rinks, MD  fludrocortisone (FLORINEF) 0.1 MG tablet Take 0.1 mg by mouth daily.  05/13/20  Yes [provider]  Menthol, Topical Analgesic, (MINERAL ICE EX) Apply 1 application topically at bedtime.   Yes [provider]  traMADol (ULTRAM) 50 MG tablet Take 1 tablet (50 mg total) by mouth every 6 (six) hours as needed for moderate pain (facial contusion). 05/02/20  Yes Drenda Freeze, MD    Physical Exam: Vitals:   05/17/20 1752 05/17/20 1815 05/17/20 1900 05/17/20 1915  BP:  (!) 157/88 (!) 163/90 (!) 161/90  Pulse: 88 91 93 92  Resp: 14 15 (!) 21 13  Temp:  TempSrc:      SpO2: 100% 100% 100% 98%  Weight:      Height:        Constitutional: NAD, calm, comfortable Vitals:   05/17/20 1752 05/17/20 1815 05/17/20 1900 05/17/20 1915  BP:  (!) 157/88 (!) 163/90 (!) 161/90  Pulse: 88 91 93 92  Resp: 14 15 (!) 21 13  Temp:      TempSrc:      SpO2: 100% 100% 100% 98%  Weight:      Height:       Eyes: PERRL, lids and conjunctivae normal ENMT: Mucous membranes are moist. Posterior pharynx clear of any exudate or lesions.Normal dentition.  Neck: normal, supple, no masses, no thyromegaly Respiratory: clear to auscultation bilaterally, no wheezing, no crackles. Normal  respiratory effort. No accessory muscle use.  Cardiovascular: Regular rate and rhythm, no murmurs / rubs / gallops. No extremity edema. 2+ pedal pulses. No carotid bruits.  Abdomen: no tenderness, no masses palpated. No hepatosplenomegaly. Bowel sounds positive.  Musculoskeletal: no clubbing / cyanosis. No joint deformity upper and lower extremities. Good ROM, no contractures. Normal muscle tone.  Skin: no rashes, lesions, ulcers. No induration Neurologic: Demented, following simple command.  Psychiatric: Demented    Labs on Admission: I have personally reviewed following labs and imaging studies  CBC: Recent Labs  Lab 05/17/20 1526  WBC 8.5  NEUTROABS 7.2  HGB 10.8*  HCT 34.3*  MCV 94.8  PLT 751*   Basic Metabolic Panel: Recent Labs  Lab 05/17/20 1526  NA 138  K 3.6  CL 105  CO2 24  GLUCOSE 109*  BUN 17  CREATININE 0.76  CALCIUM 8.8*   GFR: Estimated Creatinine Clearance: 35.2 mL/min (by C-G formula based on SCr of 0.76 mg/dL). Liver Function Tests: Recent Labs  Lab 05/17/20 1526  AST 16  ALT 10  ALKPHOS 59  BILITOT 0.6  PROT 5.9*  ALBUMIN 3.2*   No results for input(s): LIPASE, AMYLASE in the last 168 hours. No results for input(s): AMMONIA in the last 168 hours. Coagulation Profile: Recent Labs  Lab 05/17/20 1526  INR 1.1   Cardiac Enzymes: No results for input(s): CKTOTAL, CKMB, CKMBINDEX, TROPONINI in the last 168 hours. BNP (last 3 results) No results for input(s): PROBNP in the last 8760 hours. HbA1C: No results for input(s): HGBA1C in the last 72 hours. CBG: Recent Labs  Lab 05/17/20 1529  GLUCAP 86   Lipid Profile: No results for input(s): CHOL, HDL, LDLCALC, TRIG, CHOLHDL, LDLDIRECT in the last 72 hours. Thyroid Function Tests: No results for input(s): TSH, T4TOTAL, FREET4, T3FREE, THYROIDAB in the last 72 hours. Anemia Panel: No results for input(s): VITAMINB12, FOLATE, FERRITIN, TIBC, IRON, RETICCTPCT in the last 72 hours. Urine  analysis:    Component Value Date/Time   COLORURINE YELLOW 05/03/2020 [place] 05/03/2020 1229   LABSPEC 1.014 05/03/2020 1229   PHURINE 6.0 05/03/2020 1229   GLUCOSEU NEGATIVE 05/03/2020 1229   HGBUR NEGATIVE 05/03/2020 1229   BILIRUBINUR NEGATIVE 05/03/2020 1229   KETONESUR 5 (A) 05/03/2020 [place] 05/03/2020 1229   UROBILINOGEN 1.0 04/07/2009 1413   NITRITE NEGATIVE 05/03/2020 1229   LEUKOCYTESUR NEGATIVE 05/03/2020 1229    Radiological Exams on Admission: DG Chest 1 View  Result Date: 05/17/2020 CLINICAL DATA:  Syncope. EXAM: CHEST  1 VIEW COMPARISON:  Apr 13, 2020 FINDINGS: There is no evidence of acute infiltrate, pleural effusion or pneumothorax. The heart size and mediastinal contours are within normal  limits. The visualized skeletal structures are unremarkable. IMPRESSION: No active disease. Electronically Signed   By: Virgina Norfolk M.D.   On: 05/17/2020 15:57    EKG: Independently reviewed. NSR, no PR or QTC prolongation  Assessment/Plan Active Problems:   Syncope  Recurrent syncope -With Hx of orthostatic hypotension, pt on Fludrocortisone, consider pressure stocking -Pt has a Loop recorder implanted, D/W on call cardiology who will access to the data base and review -There was a plan on recent admission, for TEE but pt's daughter declined due to risk outweighing benefit  Question of GI bleeding -Her H/H stable compared to 3 weeks ago, ED physician did FOBT which is positive. She is at increasing risk of GI bleed, and she is on ASA and Plavix for recent stroke.  -I hold her Plavix but continue ASA for now, until her H/H remains stable >24 hours. -GI service was contacted. But with discussion with pt's daughter at bedside, she told me she will not agreed with endoscope given pt's age and recurrent syncope. -PPI BID, NPO, IF H/H remains stable overnight, can resume diet.  Dementia -confirmed with daughter that pt's code status  remains DNR   DVT prophylaxis: Lovenox Code Status: DNR Family Communication: Daughter at bedside Disposition Plan: Pt has complicated medical issue of recurrent syncope and may have new GI bleeding issue, will like need more than 2 midnight hospital stay. Consults called: GI, Cardio Admission status: Tele admit   Lequita Halt MD [facility] Pager [phone]    05/17/2020, 7:38 PM

## 2020-05-17 NOTE — ED Triage Notes (Addendum)
Pt brought to ED via EMS from [facility]. Staff reported to EMS that pt became unresponsive in her chair following lunch. SpO2 at SNF 91%, 4L placfed by staff, pt increased to 95%. EMS initial BP 74/25 on arrival, no manual BP obtained; last BP with EMS--154/74. Per family, recently started having episodes of hypotension that causes her to pass out and fall; pt reportedly seen in ED multiple times for same issue. Hx CVA with speech deficits. Dementia at baseline. Pt currently alert with spontaneous eye opening but does not answers questions. Bruises present on both sides of face, chin, and neck. 97% on room air. Daughter at bedside.

## 2020-05-17 NOTE — ED Provider Notes (Signed)
[facility]PART[facility] Provider Note   CSN: [id number] Arrival date & time: 05/17/20  1415     History Chief Complaint  Patient presents with  . Altered Mental Status    Sheila Parrish is a 86 y.o. female. with medical history significant of orthostatic hypotension, dementia, history of CVA, ventricular bigeminy and multiple previous syncopal episodes who was brought in after sustaining is syncopal episode. Hx is gathered by the pt's daughter, EMS, and EMR. The patient became hypotensive  After eating lunch at her home where she has 24 hour care. She lost consciousness and daughter estimates that it was for almost 30 minutes. Initial bp of 74 systolic. BP improved and resolved. She has had multiple evaluations of syncope with cardiology by Dr. Radford Pax and with Dr. Riki Sheer for EP studies without findings.  HPI     Past Medical History:  Diagnosis Date  . High cholesterol   . Scarlet fever 1936   Hospitalized for a month  . Stroke (cerebrum) ([place])   . Syncope     Patient Active Problem List   Diagnosis Date Noted  . Syncope 05/17/2020  . Labile blood pressure   . Gastrointestinal hemorrhage   . CVA (cerebral vascular accident) ([place]) 02/28/2020  . Frequent PVCs   . Syncope and collapse 11/08/2018  . Near syncope 10/29/2016  . Orthostatic dizziness 10/29/2016  . Ventricular bigeminy 10/29/2016  . Back soft tissue injury 05/30/2013    Past Surgical History:  Procedure Laterality Date  . ABDOMINAL HYSTERECTOMY    . implantable loop recorder placement  03/03/2020   Medtronic Reveal Linq model [place] 11(SN [id number]) implanted for cryptogenic stroke  . TOTAL KNEE ARTHROPLASTY Left 2010     OB History   No obstetric history on file.     Family History  Problem Relation Age of Onset  . Arthritis Mother        Died at 63  . Stroke Father 10       Died at age 40  . CAD Neg Hx     Social History   Tobacco Use  . Smoking status: Never  Smoker  . Smokeless tobacco: Never Used  Substance Use Topics  . Alcohol use: No  . Drug use: No    Home Medications Prior to Admission medications   Medication Sig Start Date End Date Taking? Authorizing Provider  acetaminophen (TYLENOL) 500 MG tablet Take 500 mg by mouth every 6 (six) hours as needed for headache (pain).   Yes [provider]  aspirin 325 MG tablet Take 1 tablet (325 mg total) by mouth daily. 03/02/20  Yes Swayze, Ava, DO  clopidogrel (PLAVIX) 75 MG tablet Take 1 tablet (75 mg total) by mouth daily. 04/02/20  Yes Allred, Jeneen Rinks, MD  fludrocortisone (FLORINEF) 0.1 MG tablet Take 0.1 mg by mouth daily.  05/13/20  Yes [provider]  Menthol, Topical Analgesic, (MINERAL ICE EX) Apply 1 application topically at bedtime.   Yes [provider]  traMADol (ULTRAM) 50 MG tablet Take 1 tablet (50 mg total) by mouth every 6 (six) hours as needed for moderate pain (facial contusion). 05/02/20  Yes Drenda Freeze, MD    Allergies    Donepezil, Alendronate, Atorvastatin, Bactrim [sulfamethoxazole-trimethoprim], Macrodantin [nitrofurantoin macrocrystal], Simvastatin, Sulfa antibiotics, and Azithromycin  Review of Systems   Review of Systems  Unable to perform ROS: Dementia    Physical Exam Updated Vital Signs BP (!) 132/55 (BP Location: Left Arm)   Pulse 86  Temp 98.5 F (36.9 C) (Oral)   Resp 18   Ht 4\' 11"  (1.499 m)   Wt 47.2 kg   SpO2 98%   BMI 21.01 kg/m   Physical Exam Vitals and nursing note reviewed.  Constitutional:      General: She is not in acute distress.    Appearance: She is well-developed. She is not diaphoretic.  HENT:     Head: Normocephalic.     Comments: Old bruising across the  Lower half of the face and neck Eyes:     General: No scleral icterus.    Conjunctiva/sclera: Conjunctivae normal.  Cardiovascular:     Rate and Rhythm: Normal rate and regular rhythm.     Heart sounds: Normal heart sounds. No murmur. No  friction rub. No gallop.   Pulmonary:     Effort: Pulmonary effort is normal. No respiratory distress.     Breath sounds: Normal breath sounds.  Abdominal:     General: Bowel sounds are normal. There is no distension.     Palpations: Abdomen is soft. There is no mass.     Tenderness: There is no abdominal tenderness. There is no guarding.  Musculoskeletal:     Cervical back: Normal range of motion.  Skin:    General: Skin is warm and dry.  Neurological:     Mental Status: She is alert and oriented to person, place, and time.  Psychiatric:        Behavior: Behavior normal.     ED Results / Procedures / Treatments   Labs (all labs ordered are listed, but only abnormal results are displayed) Labs Reviewed  CBC WITH DIFFERENTIAL/PLATELET - Abnormal; Notable for the following components:      Result Value   RBC 3.62 (*)    Hemoglobin 10.8 (*)    HCT 34.3 (*)    Platelets 408 (*)    Lymphs Abs 0.6 (*)    All other components within normal limits  COMPREHENSIVE METABOLIC PANEL - Abnormal; Notable for the following components:   Glucose, Bld 109 (*)    Calcium 8.8 (*)    Total Protein 5.9 (*)    Albumin 3.2 (*)    All other components within normal limits  CBC - Abnormal; Notable for the following components:   RBC 3.64 (*)    Hemoglobin 10.9 (*)    HCT 33.7 (*)    Platelets 421 (*)    All other components within normal limits  HEMOGLOBIN AND HEMATOCRIT, BLOOD - Abnormal; Notable for the following components:   Hemoglobin 10.8 (*)    HCT 33.2 (*)    All other components within normal limits  POC OCCULT BLOOD, ED - Abnormal; Notable for the following components:   Fecal Occult Bld POSITIVE (*)    All other components within normal limits  SARS CORONAVIRUS 2 BY RT PCR (HOSPITAL ORDER, [place] [facility])  PROTIME-INR  GLUCOSE, CAPILLARY  URINALYSIS, ROUTINE W REFLEX MICROSCOPIC  CBG MONITORING, ED  TYPE AND SCREEN  ABO/RH     EKG None  Radiology DG Chest 1 View  Result Date: 05/17/2020 CLINICAL DATA:  Syncope. EXAM: CHEST  1 VIEW COMPARISON:  Apr 13, 2020 FINDINGS: There is no evidence of acute infiltrate, pleural effusion or pneumothorax. The heart size and mediastinal contours are within normal limits. The visualized skeletal structures are unremarkable. IMPRESSION: No active disease. Electronically Signed   By: Virgina Norfolk M.D.   On: 05/17/2020 15:57    Procedures .Critical  Care Performed by: Margarita Mail, PA-C Authorized by: Margarita Mail, PA-C   Critical care provider statement:    Critical care time (minutes):  60   Critical care time was exclusive of:  Separately billable procedures and treating other patients   Critical care was necessary to treat or prevent imminent or life-threatening deterioration of the following conditions:  Circulatory failure (GI bleed)   Critical care was time spent personally by me on the following activities:  Discussions with consultants, evaluation of patient's response to treatment, examination of patient, ordering and performing treatments and interventions, ordering and review of laboratory studies, ordering and review of radiographic studies, pulse oximetry, re-evaluation of patient's condition, obtaining history from patient or surrogate and review of old charts   (including critical care time)  Medications Ordered in ED Medications  acetaminophen (TYLENOL) tablet 500 mg (has no administration in time range)  aspirin tablet 325 mg (325 mg Oral Given 05/18/20 1028)  fludrocortisone (FLORINEF) tablet 0.1 mg (0.1 mg Oral Given 05/18/20 1213)  sodium chloride flush (NS) 0.9 % injection 3 mL (3 mLs Intravenous Not Given 05/18/20 0933)  0.9 %  sodium chloride infusion ( Intravenous Stopped 05/18/20 0933)  pantoprazole (PROTONIX) injection 40 mg (40 mg Intravenous Given 05/18/20 0936)  hydrALAZINE (APRESOLINE) tablet 25 mg (has no administration in time range)   pantoprazole (PROTONIX) injection 40 mg (40 mg Intravenous Given 05/17/20 1812)    ED Course  I have reviewed the triage vital signs and the nursing notes.  Pertinent labs & imaging results that were available during my care of the patient were reviewed by me and considered in my medical decision making (see chart for details).  Clinical Course as of May 19 1411  Sat May 17, 2020  1707 Hemoglobin(!): 10.8 [AH]    Clinical Course User Index [AH] Margarita Mail, PA-C   MDM Rules/Calculators/A&P                      CC: Syncope VS:  Vitals:   05/18/20 0515 05/18/20 0520 05/18/20 0753 05/18/20 1130  BP: (!) 146/86 113/90 (!) 152/70 (!) 132/55  Pulse: 92  71 86  Resp: 18  18 18   Temp: 98.1 F (36.7 C)  98.1 F (36.7 C) 98.5 F (36.9 C)  TempSrc: Oral  Oral Oral  SpO2: 98%  99% 98%  Weight: 47.2 kg     Height:        OI:NOMVEHM is gathered by patient's daughter and EMR. Previous records obtained and reviewed. DDX:The patient's complaint of syncope involves an extensive number of diagnostic and treatment options, and is a complaint that carries with it a high risk of complications, morbidity, and potential mortality. Given the large differential diagnosis, medical decision making is of high complexity. The differential for syncope is extensive and includes, but is not limited to: arrythmia (Vtach, SVT, SSS, sinus arrest, AV block, bradycardia) aortic stenosis, AMI, HOCM, PE, atrial myxoma, pulmonary hypertension, orthostatic hypotension, (hypovolemia, drug effect, GB syndrome, micturition, cough, swall) carotid sinus sensitivity, Seizure, TIA/CVA, hypoglycemia,  Vertigo.  Labs: I ordered reviewed and interpreted labs which include CBC which shows a 2 g drop in the patient's hemoglobin over the past 2 week Fecal occult positive with maroon-colored stool PT/INR within normal limits CMP with mildly elevated blood glucose, low calcium and low protein levels of insignificant  value.    Imaging: I ordered and reviewed images which included 1 view chest x-ray. I independently visualized and interpreted all imaging. There  are no acute, significant findings on today's images. EKG: Normal sinus rhythm at a rate of 81 Consults: Dr. Paulita Fujita who will consult on the patient for GI bleed MDM: Patient brought in by daughter for syncopal event.  She has had multiple admissions and work-ups for the same.  During the course of the work-up I discovered that her hemoglobin had dropped by 2 g over the past 2 weeks and she had maroon-colored stool on examining finger.  I do long discussion with the patient's daughter.  I tried to explain all of the potential mass mechanisms of syncope.  I do not feel in any way that this was the cause of her syncopal event today.  The patient has been having syncopal events for 3 years.  This seems to be an acute finding.  I also had a very long discussion about goals of care with the patient's daughter who does not have any definitive answers as to what her goals are.  I did ask her to consult with her family members and begin thinking about these as the patient has dementia, seems to be having increased hospitalizations for falls, syncopal events and is 86 years of age.  The patient's daughter states that she will begin having discussions with her family members.  Patient will be admitted to the hospitalist service.  She has negative orthostatic vital signs but has an abnormally labile pressure here in the emergency department.  She is on Protonix bolus and drip. Patient disposition: Admit The patient appears reasonably stabilized for admission considering the current resources, flow, and capabilities available in the ED at this time, and I doubt any other [facility] requiring further screening and/or treatment in the ED prior to admission.        Final Clinical Impression(s) / ED Diagnoses Final diagnoses:  Gastrointestinal hemorrhage, unspecified  gastrointestinal hemorrhage type  Labile blood pressure  Syncope, unspecified syncope type    Rx / DC Orders ED Discharge Orders    None       Margarita Mail, PA-C 05/18/20 1416    Truddie Hidden, MD 05/18/20 [phone]

## 2020-05-18 LAB — CBC
HCT: 33.7 % — ABNORMAL LOW (ref 36.0–46.0)
Hemoglobin: 10.9 g/dL — ABNORMAL LOW (ref 12.0–15.0)
MCH: 29.9 pg (ref 26.0–34.0)
MCHC: 32.3 g/dL (ref 30.0–36.0)
MCV: 92.6 fL (ref 80.0–100.0)
Platelets: 421 10*3/uL — ABNORMAL HIGH (ref 150–400)
RBC: 3.64 MIL/uL — ABNORMAL LOW (ref 3.87–5.11)
RDW: 13.2 % (ref 11.5–15.5)
WBC: 5.6 10*3/uL (ref 4.0–10.5)
nRBC: 0 % (ref 0.0–0.2)

## 2020-05-18 LAB — URINALYSIS, ROUTINE W REFLEX MICROSCOPIC
Bilirubin Urine: NEGATIVE
Glucose, UA: NEGATIVE mg/dL
Ketones, ur: 20 mg/dL — AB
Leukocytes,Ua: NEGATIVE
Nitrite: NEGATIVE
Protein, ur: NEGATIVE mg/dL
Specific Gravity, Urine: 1.006 (ref 1.005–1.030)
pH: 6 (ref 5.0–8.0)

## 2020-05-18 LAB — GLUCOSE, CAPILLARY: Glucose-Capillary: 84 mg/dL (ref 70–99)

## 2020-05-18 LAB — ABO/RH: ABO/RH(D): A NEG

## 2020-05-18 MED ORDER — MIDODRINE HCL 5 MG PO TABS
2.5000 mg | ORAL_TABLET | Freq: Two times a day (BID) | ORAL | Status: DC
  Administered 2020-05-18 – 2020-05-22 (×9): 2.5 mg via ORAL
  Filled 2020-05-18 (×9): qty 1

## 2020-05-18 NOTE — Consult Note (Signed)
[facility] Consultation Note  Referring Provider: [facility] Primary Care Physician:  Lavone Orn, MD  Reason for Consultation:  Anemia, hemoccult-positive stools  HPI: Sheila Parrish is a 86 y.o. female whom we've been asked to see for anemia and hemoccult-positive stools.  She is on clopidigrel and has had recurrent syncope, with falls and facial trauma.  Slight dip in Hgb; stools hemoccult-positive but no overt bleeding.  Unclear if prior endoscopy or colonoscopy.  CT scan last month showed redundant colon but no obvious intraluminal GI tract lesion.  Patient unable to provide any history.  Per daughter, no reports of overt bleeding or abdominal pain or change in bowel habits.   Past Medical History:  Diagnosis Date  . High cholesterol   . Scarlet fever 1936   Hospitalized for a month  . Stroke (cerebrum) ([place])   . Syncope     Past Surgical History:  Procedure Laterality Date  . ABDOMINAL HYSTERECTOMY    . implantable loop recorder placement  03/03/2020   Medtronic Reveal Linq model [place] 11(SN [id number]) implanted for cryptogenic stroke  . TOTAL KNEE ARTHROPLASTY Left 2010    Prior to Admission medications   Medication Sig Start Date End Date Taking? Authorizing Provider  acetaminophen (TYLENOL) 500 MG tablet Take 500 mg by mouth every 6 (six) hours as needed for headache (pain).   Yes [provider]  aspirin 325 MG tablet Take 1 tablet (325 mg total) by mouth daily. 03/02/20  Yes Swayze, Ava, DO  clopidogrel (PLAVIX) 75 MG tablet Take 1 tablet (75 mg total) by mouth daily. 04/02/20  Yes Allred, Jeneen Rinks, MD  fludrocortisone (FLORINEF) 0.1 MG tablet Take 0.1 mg by mouth daily.  05/13/20  Yes [provider]  Menthol, Topical Analgesic, (MINERAL ICE EX) Apply 1 application topically at bedtime.   Yes [provider]  traMADol (ULTRAM) 50 MG tablet Take 1 tablet (50 mg total) by mouth every 6 (six) hours as needed for moderate pain (facial  contusion). 05/02/20  Yes Drenda Freeze, MD    Current Facility-Administered Medications  Medication Dose Route Frequency Provider Last Rate Last Admin  . acetaminophen (TYLENOL) tablet 500 mg  500 mg Oral Q6H PRN Wynetta Fines T, MD      . aspirin tablet 325 mg  325 mg Oral Daily Wynetta Fines T, MD   325 mg at 05/18/20 1028  . fludrocortisone (FLORINEF) tablet 0.1 mg  0.1 mg Oral Daily Wynetta Fines T, MD   0.1 mg at 05/18/20 1213  . hydrALAZINE (APRESOLINE) tablet 25 mg  25 mg Oral Q6H PRN Wynetta Fines T, MD      . pantoprazole (PROTONIX) injection 40 mg  40 mg Intravenous Q12H Wynetta Fines T, MD   40 mg at 05/18/20 0936  . sodium chloride flush (NS) 0.9 % injection 3 mL  3 mL Intravenous Q12H Lequita Halt, MD        Allergies as of 05/17/2020 - Review Complete 05/17/2020  Allergen Reaction Noted  . Donepezil Nausea And Vomiting 11/09/2018  . Alendronate Other (See Comments) 09/02/2014  . Atorvastatin Other (See Comments) 09/02/2014  . Bactrim [sulfamethoxazole-trimethoprim] Nausea And Vomiting 02/21/2013  . Macrodantin [nitrofurantoin macrocrystal] Other (See Comments) 05/30/2013  . Simvastatin Other (See Comments) 09/02/2014  . Sulfa antibiotics Nausea And Vomiting 02/21/2013  . Azithromycin Rash 05/30/2013    Family History  Problem Relation Age of Onset  . Arthritis Mother        Died at 63  .  Stroke Father 50       Died at age 7  . CAD Neg Hx     Social History   Socioeconomic History  . Marital status: Married    Spouse name: Not on file  . Number of children: Not on file  . Years of education: Not on file  . Highest education level: Not on file  Occupational History  . Occupation: Retired  Tobacco Use  . Smoking status: Never Smoker  . Smokeless tobacco: Never Used  Substance and Sexual Activity  . Alcohol use: No  . Drug use: No  . Sexual activity: Not on file  Other Topics Concern  . Not on file  Social History Narrative   Lives in 1 story apartment on  the 2nd floor, with her husband   Has 3 adult children   4 year degree   Retired 1st grade teacher   Social Determinants of Radio broadcast assistant Strain:   . Difficulty of Paying Living Expenses:   Food Insecurity:   . Worried About Charity fundraiser in the Last Year:   . Arboriculturist in the Last Year:   Transportation Needs:   . Film/video editor (Medical):   [name] Kitchen Lack of Transportation (Non-Medical):   Physical Activity:   . Days of Exercise per Week:   . Minutes of Exercise per Session:   Stress:   . Feeling of Stress :   Social Connections:   . Frequency of Communication with Friends and Family:   . Frequency of Social Gatherings with Friends and Family:   . Attends Religious Services:   . Active Member of Clubs or Organizations:   . Attends Archivist Meetings:   [name] Kitchen Marital Status:   Intimate Partner Violence:   . Fear of Current or Ex-Partner:   . Emotionally Abused:   [name] Kitchen Physically Abused:   . Sexually Abused:     Review of Systems: As per HPI, all others negative  Physical Exam: Vital signs in last 24 hours: Temp:  [97.5 F (36.4 C)-98.7 F (37.1 C)] 98.5 F (36.9 C) (06/06 1130) Pulse Rate:  [71-93] 86 (06/06 1130) Resp:  [11-24] 18 (06/06 1130) BP: (94-167)/(54-114) 132/55 (06/06 1130) SpO2:  [95 %-100 %] 98 % (06/06 1130) Weight:  [47.2 kg-57 kg] 47.2 kg (06/06 0515) Last BM Date: 05/17/20 General:   Alert,confused, extensive facial bruising Head:  Normocephalic and atraumatic. Eyes:  Sclera clear, no icterus.   Conjunctiva pink. Ears:  Normal auditory acuity. Nose:  No deformity, discharge,  or lesions. Mouth:  No deformity or lesions.  Oropharynx pink & moist. Neck:  Supple; no masses or thyromegaly. Abdomen:  Soft, nontender and nondistended. No masses, hepatosplenomegaly or hernias noted. Normal bowel sounds, without guarding, and without rebound.     Msk:  Symmetrical without gross deformities. Normal posture. Pulses:   Normal pulses noted. Extremities:  Without clubbing or edema. Neurologic:  Chronic dementia Skin:  Intact without significant lesions or rashes. Psych:  Awake, alert, confused   Lab Results: Recent Labs    05/17/20 1526 05/17/20 2219 05/18/20 0511  WBC 8.5  --  5.6  HGB 10.8* 10.8* 10.9*  HCT 34.3* 33.2* 33.7*  PLT 408*  --  421*   BMET Recent Labs    05/17/20 1526  NA 138  K 3.6  CL 105  CO2 24  GLUCOSE 109*  BUN 17  CREATININE 0.76  CALCIUM 8.8*   LFT Recent Labs  05/17/20 1526  PROT 5.9*  ALBUMIN 3.2*  AST 16  ALT 10  ALKPHOS 59  BILITOT 0.6   PT/INR Recent Labs    05/17/20 1526  LABPROT 14.2  INR 1.1    Studies/Results: DG Chest 1 View  Result Date: 05/17/2020 CLINICAL DATA:  Syncope. EXAM: CHEST  1 VIEW COMPARISON:  Apr 13, 2020 FINDINGS: There is no evidence of acute infiltrate, pleural effusion or pneumothorax. The heart size and mediastinal contours are within normal limits. The visualized skeletal structures are unremarkable. IMPRESSION: No active disease. Electronically Signed   By: Virgina Norfolk M.D.   On: 05/17/2020 15:57    Impression:  1.  Recurrent syncope.  History chronic orthostasis. 2.  Facial bruising, from #1 above. 3.  Anemia, mild. 4.  Hemoccult positive stools without overt GI bleeding. 5.  Recurrent stroke; on clopidigrel.  Plan:  1.  Plavix on hold; key question is whether the benefits of Plavix still outweigh the risks (recurrent syncope with facial trauma).  If patient continues on Plavix, would recommend long-term pantoprazole 40 mg po qd for GI acid-peptic prophylaxis. 2.  I do not think patient's mild anemia is contributing to her syncope, the more so as patient's syncope occurs while sitting and at rest. 3.  Given patient's age and comorbidities, I would not pursue endoscopy or colonoscopy in absence of overt destabilizing and life-threatening GI bleeding. 4.  [facility] will follow.   LOS: 1 day    Takuya Lariccia M  05/18/2020, 2:37 PM  Cell [phone] If no answer or after 5 PM call [phone]

## 2020-05-18 NOTE — Progress Notes (Signed)
PROGRESS NOTE    Sheila Parrish  BVQ:[id number] DOB: 04/08/1927 DOA: 05/17/2020 PCP: Lavone Orn, MD   Brief Narrative: Patient is a 77 old female with history of recurrent syncopal spell, Orthostatic hypotension, dementia, CVA, hypertension who presented to the emergency department with a syncopal episode .  She was admitted here 2 weeks ago for same reason.  Syncopal episode happened while sitting watching TV.  She readily regained her consciousness after about 20 to 30 minutes.  On presentation she was orthostatic with blood pressure of 70/ 50 mmhG. Her blood pressure quickly rebounded and she is hemodynamically stable at present.  FOBT test done in the emergency department came out a positive.  Cardiology consulted.  Assessment & Plan:   Active Problems:   Syncope   Recurrent syncope: History of chronic orthostatic hypotension.  She is on fludrocortisone.  Pressure stockings ordered.  She has a loop recorder and the recorder interrogation did not show any events.  Case was also discussed with cardiology by the admitting physician.  On her last admission, there was plan for TEE but daughter declined due to risk outweighing benefit due to her advanced age.  Currently her blood pressure is stable on supine but she is profoundly orthostatic. I have requested cardiology consultation today.  Suspicion for GI bleed: FOBT was done in the emergency department is currently positive.  She is on aspirin and Plavix for recent stroke.  Plavix has been held, aspirin continued.  Hemoglobin has remained stable.  GI service was contacted by the admitting physician.  As per the report, family not agreeable for intervention.  Currently she is on PPI twice daily.  No report of hematochezia or melena.  Dementia: Continue supportive care.         DVT prophylaxis:SCD Code Status: DNR Family Communication: SCD Status is: Inpatient  Remains inpatient appropriate because:Unsafe d/c plan   Dispo: The  patient is from: Home              Anticipated d/c is to: Home              Anticipated d/c date is: 1 day              Patient currently is not medically stable to d/c.    Consultants: Cardiology  Procedures:None  Antimicrobials:  Anti-infectives (From admission, onward)   None      Subjective: Patient seen and examined at the bedside this morning.  Daughters were present at the bedside.  Currently her blood pressure stable while lying supine.  Patient has advanced dementia.  She was orthostatic when blood pressure was checked this already morning.  Objective: Vitals:   05/18/20 0024 05/18/20 0049 05/18/20 0515 05/18/20 0520  BP: (!) 113/99  (!) 146/86 113/90  Pulse: 88 83 92   Resp: 18  18   Temp:  (!) 97.5 F (36.4 C) 98.1 F (36.7 C)   TempSrc:  Oral Oral   SpO2:  96% 98%   Weight:   47.2 kg   Height:        Intake/Output Summary (Last 24 hours) at 05/18/2020 0746 Last data filed at 05/18/2020 0630 Gross per 24 hour  Intake --  Output 1050 ml  Net -1050 ml   Filed Weights   05/17/20 1439 05/18/20 0515  Weight: 57 kg 47.2 kg    Examination:  General exam: Extremely deconditioned, debilitated, elderly female HEENT: Facial ecchymosis Respiratory system: Bilateral equal air entry, normal vesicular breath sounds, no wheezes or  crackles  Cardiovascular system: S1 & S2 heard, RRR. No JVD, murmurs, rubs, gallops or clicks. No pedal edema. Gastrointestinal system: Abdomen is nondistended, soft and nontender. No organomegaly or masses felt. Normal bowel sounds heard. Central nervous system: Alert and awake but not oriented Extremities: No edema, no clubbing ,no cyanosis Skin: No  lesions or ulcers,no icterus ,no pallor   Data Reviewed: I have personally reviewed following labs and imaging studies  CBC: Recent Labs  Lab 05/17/20 1526 05/17/20 2219 05/18/20 0511  WBC 8.5  --  5.6  NEUTROABS 7.2  --   --   HGB 10.8* 10.8* 10.9*  HCT 34.3* 33.2* 33.7*  MCV  94.8  --  92.6  PLT 408*  --  831*   Basic Metabolic Panel: Recent Labs  Lab 05/17/20 1526  NA 138  K 3.6  CL 105  CO2 24  GLUCOSE 109*  BUN 17  CREATININE 0.76  CALCIUM 8.8*   GFR: Estimated Creatinine Clearance: 31.2 mL/min (by C-G formula based on SCr of 0.76 mg/dL). Liver Function Tests: Recent Labs  Lab 05/17/20 1526  AST 16  ALT 10  ALKPHOS 59  BILITOT 0.6  PROT 5.9*  ALBUMIN 3.2*   No results for input(s): LIPASE, AMYLASE in the last 168 hours. No results for input(s): AMMONIA in the last 168 hours. Coagulation Profile: Recent Labs  Lab 05/17/20 1526  INR 1.1   Cardiac Enzymes: No results for input(s): CKTOTAL, CKMB, CKMBINDEX, TROPONINI in the last 168 hours. BNP (last 3 results) No results for input(s): PROBNP in the last 8760 hours. HbA1C: No results for input(s): HGBA1C in the last 72 hours. CBG: Recent Labs  Lab 05/17/20 1529 05/18/20 0511  GLUCAP 86 84   Lipid Profile: No results for input(s): CHOL, HDL, LDLCALC, TRIG, CHOLHDL, LDLDIRECT in the last 72 hours. Thyroid Function Tests: No results for input(s): TSH, T4TOTAL, FREET4, T3FREE, THYROIDAB in the last 72 hours. Anemia Panel: No results for input(s): VITAMINB12, FOLATE, FERRITIN, TIBC, IRON, RETICCTPCT in the last 72 hours. Sepsis Labs: No results for input(s): PROCALCITON, LATICACIDVEN in the last 168 hours.  Recent Results (from the past 240 hour(s))  SARS Coronavirus 2 by RT PCR (hospital order, performed in [facility] hospital lab) Nasopharyngeal Nasopharyngeal Swab     Status: None   Collection Time: 05/17/20  8:47 PM   Specimen: Nasopharyngeal Swab  Result Value Ref Range Status   SARS Coronavirus 2 NEGATIVE NEGATIVE Final    Comment: (NOTE) SARS-CoV-2 target nucleic acids are NOT DETECTED. The SARS-CoV-2 RNA is generally detectable in upper and lower respiratory specimens during the acute phase of infection. The lowest concentration of SARS-CoV-2 viral copies this assay  can detect is 250 copies / mL. A negative result does not preclude SARS-CoV-2 infection and should not be used as the sole basis for treatment or other patient management decisions.  A negative result may occur with improper specimen collection / handling, submission of specimen other than nasopharyngeal swab, presence of viral mutation(s) within the areas targeted by this assay, and inadequate number of viral copies (<250 copies / mL). A negative result must be combined with clinical observations, patient history, and epidemiological information. Fact Sheet for Patients:   StrictlyIdeas.no Fact Sheet for Healthcare Providers: BankingDealers.co.za This test is not yet approved or cleared  by the [place] FDA and has been authorized for detection and/or diagnosis of SARS-CoV-2 by FDA under an Emergency Use Authorization (EUA).  This EUA will remain in effect (meaning this test  can be used) for the duration of the COVID-19 declaration under Section 564(b)(1) of the Act, 21 U.S.C. section 360bbb-3(b)(1), unless the authorization is terminated or revoked sooner. Performed at [facility], [place] [address], [place], [place] [address]          Radiology Studies: DG Chest 1 View  Result Date: 05/17/2020 CLINICAL DATA:  Syncope. EXAM: CHEST  1 VIEW COMPARISON:  Apr 13, 2020 FINDINGS: There is no evidence of acute infiltrate, pleural effusion or pneumothorax. The heart size and mediastinal contours are within normal limits. The visualized skeletal structures are unremarkable. IMPRESSION: No active disease. Electronically Signed   By: Virgina Norfolk M.D.   On: 05/17/2020 15:57        Scheduled Meds: . aspirin  325 mg Oral Daily  . enoxaparin (LOVENOX) injection  40 mg Subcutaneous Q24H  . fludrocortisone  0.1 mg Oral Daily  . pantoprazole (PROTONIX) IV  40 mg Intravenous Q12H  . sodium chloride flush  3 mL Intravenous Q12H    Continuous Infusions:   LOS: 1 day    Time spent:35 mins, More than 50% of that time was spent in counseling and/or coordination of care.      Shelly Coss, MD [facility] P6/05/2020, 7:46 AM

## 2020-05-18 NOTE — Evaluation (Signed)
Physical Therapy Evaluation Patient Details Name: Sheila Parrish MRN: [id number] DOB: April 01, 1927 Today's Date: 05/18/2020   History of Present Illness  Sheila Parrish is a 86 y.o. female with medical history significant of recurrent syncopal spells, orthostatic hypotension, dementia, CVA, ventricular bigeminy, and HTN admitted after suffering a syncopal spell.   Recent hospitalization last month for syncope with injury striking her face on the ground.  Clinical Impression  Patient presents with mobility not far from baseline, but somewhat limited by cognition.  Likely she would do better in her home environment with normal aides who know her schedule.  She needed max A for supine to sit and mod A for sit to stand.  She may benefit from HHPT at ALF to watch vitals during mobility and ensure safety with training aides.  PT to follow acutely.     Follow Up Recommendations Home health PT(return to [facility] with aide assist)    Equipment Recommendations  None recommended by PT    Recommendations for Other Services       Precautions / Restrictions Precautions Precautions: Fall Precaution Comments: check BP      Mobility  Bed Mobility Overal bed mobility: Needs Assistance Bed Mobility: Supine to Sit;Sit to Supine     Supine to sit: Max assist Sit to supine: Mod assist   General bed mobility comments: assist to sit up on EOB due to pt not initiating after several cues and increased time, to supine assist for legs into bed after she sat a while and did not attempt to lie down  Transfers Overall transfer level: Needs assistance Equipment used: Rolling walker (2 wheeled) Transfers: Sit to/from Stand Sit to Stand: Mod assist         General transfer comment: heavy lifting help to stand to RW pt again with limited focus and limited initiation  Ambulation/Gait Ambulation/Gait assistance: Min assist;Mod assist Gait Distance (Feet): 20 Feet Assistive device: Rolling walker (2  wheeled) Gait Pattern/deviations: Step-to pattern;Step-through pattern;Decreased stride length;Shuffle     General Gait Details: keeping walker at a distance and assist for safety, balance and turns, especially backing up and lining up to sit on EOB  Stairs            Wheelchair Mobility    Modified Rankin (Stroke Patients Only)       Balance Overall balance assessment: Needs assistance Sitting-balance support: Feet unsupported Sitting balance-Leahy Scale: Fair Sitting balance - Comments: at EOB S while taking BP   Standing balance support: Bilateral upper extremity supported Standing balance-Leahy Scale: Poor Standing balance comment: UE support for balance                             Pertinent Vitals/Pain Pain Assessment: Faces Faces Pain Scale: Hurts little more Pain Location: possibly back or hip when getting back in bed Pain Descriptors / Indicators: Discomfort;Grimacing Pain Intervention(s): Monitored during session    Home Living Family/patient expects to be discharged to:: Private residence Living Arrangements: Alone Available Help at Discharge: Family;Personal care attendant;Available 24 hours/day Type of Home: Independent living facility Home Access: [place]: One level Home Equipment: [place] - 2 wheels;Wheelchair - manual Additional Comments: Lives at [place] and has 24/7 personal care attendant    Prior Function Level of Independence: Needs assistance   Gait / Transfers Assistance Needed: uses walker and requires assistance for all mobility  ADL's / Homemaking Assistance Needed: requires  assist for ADLs; IADLs provided for her. Pt has meal plan at facility- CGs bring her food from dining area.  Comments: Has 24 hour CNA supervision/assist     Hand Dominance        Extremity/Trunk Assessment   Upper Extremity Assessment Upper Extremity Assessment: Generalized weakness    Lower Extremity  Assessment Lower Extremity Assessment: Generalized weakness    Cervical / Trunk Assessment Cervical / Trunk Assessment: Kyphotic  Communication   Communication: Expressive difficulties  Cognition Arousal/Alertness: Awake/alert Behavior During Therapy: Flat affect Overall Cognitive Status: History of cognitive impairments - at baseline                                 General Comments: Pt with moderate dementia (per daughter-moderate); pt able to identify self and answer simple "yes/no" questions.      General Comments General comments (skin integrity, edema, etc.): BP stable during session, checked supine, sitting and after ambulation with lowest reading 469 systolic    Exercises     Assessment/Plan    PT Assessment Patient needs continued PT services  PT Problem List Decreased strength;Decreased activity tolerance;Decreased balance;Decreased mobility;Decreased knowledge of use of DME;Decreased safety awareness;Decreased knowledge of precautions;Cardiopulmonary status limiting activity       PT Treatment Interventions DME instruction;Gait training;Functional mobility training;Therapeutic activities;Balance training;Patient/family education    PT Goals (Current goals can be found in the Care Plan section)  Acute Rehab PT Goals Patient Stated Goal: To reduce falls risk PT Goal Formulation: With family Time For Goal Achievement: 06/01/20 Potential to Achieve Goals: Fair    Frequency Min 3X/week   Barriers to discharge        Co-evaluation               AM-PAC PT "6 Clicks" Mobility  Outcome Measure Help needed turning from your back to your side while in a flat bed without using bedrails?: A Lot Help needed moving from lying on your back to sitting on the side of a flat bed without using bedrails?: Total Help needed moving to and from a bed to a chair (including a wheelchair)?: A Lot Help needed standing up from a chair using your arms (e.g.,  wheelchair or bedside chair)?: A Lot Help needed to walk in hospital room?: A Lot Help needed climbing 3-5 steps with a railing? : Total 6 Click Score: 10    End of Session   Activity Tolerance: Patient tolerated treatment well Patient left: in bed;with call bell/phone within reach;with bed alarm set;with family/visitor present   PT Visit Diagnosis: Other abnormalities of gait and mobility (R26.89);Repeated falls (R29.6)    Time: 6295-2841 PT Time Calculation (min) (ACUTE ONLY): 34 min   Charges:   PT Evaluation $PT Eval Moderate Complexity: 1 Mod PT Treatments $Gait Training: 8-22 mins        Magda Kiel, [place] [facility] [phone] 05/18/2020   Reginia Naas 05/18/2020, 6:10 PM

## 2020-05-18 NOTE — Progress Notes (Addendum)
Family concerned about patient congestion and about patient not voiding. Bladder scanner completed 260 ml, paged MD made him aware about congestion and voiding. Requested order for I and O cath if needed.

## 2020-05-18 NOTE — Progress Notes (Signed)
Second I and O cath done due to patient not being able to urinate.

## 2020-05-18 NOTE — Consult Note (Addendum)
Cardiology Consultation:   Patient ID: Sheila Parrish MRN: [id number]; DOB: 17-Jan-1927  Admit date: 05/17/2020 Date of Consult: 05/18/2020  Primary Care Provider: Lavone Orn, MD [facility] Cardiologist: Fransico Him, MD  [facility] Electrophysiologist:  Dr Rayann Heman   Patient Profile:   Sheila Parrish is a 86 y.o. female with a hx of cryptogenic stroke, syncope, dementia  who is being seen today for the evaluation of syncope at the request of Dr. Tawanna Solo  History of Present Illness:   Ms. Monrreal history of cryptogenic CVA with loop recorder, dementia, syncope in 2019 with reported hypotension at the time that responded to IVFs. Prior syncope in 2017 with negative 30 day monitor at that time.Chronic diastolic HF. Admitted with recurrent syncope. Admission 04/2020 with simliar episode  This episode occurred while sitting watching TV. Patient suddenly lost consciousness. Family denies any witnessed seizure activity. From report nonpresonsive for approx 20 minutes.    WBC 8.5 PLT 408 Hgb 10.8 K 3.6 Plt 0.76 FOBT +  COVID neg CXR no acute process EKG SR, nonspecific ST/T changes  Loop recorder check this AM was benign  In SR severely orthostatic SBP 146 lying, down to 92 standing.   04/2020 echo LVEF 65-70%, grade I DDx, normal RV function     Past Medical History:  Diagnosis Date  . High cholesterol   . Scarlet fever 1936   Hospitalized for a month  . Stroke (cerebrum) ([place])   . Syncope     Past Surgical History:  Procedure Laterality Date  . ABDOMINAL HYSTERECTOMY    . implantable loop recorder placement  03/03/2020   Medtronic Reveal Linq model [place] 11(SN [id number]) implanted for cryptogenic stroke  . TOTAL KNEE ARTHROPLASTY Left 2010     Inpatient Medications: Scheduled Meds: . aspirin  325 mg Oral Daily  . fludrocortisone  0.1 mg Oral Daily  . pantoprazole (PROTONIX) IV  40 mg Intravenous Q12H  . sodium chloride flush  3 mL Intravenous Q12H    Continuous Infusions:  PRN Meds: acetaminophen, hydrALAZINE  Allergies:    Allergies  Allergen Reactions  . Donepezil Nausea And Vomiting  . Alendronate Other (See Comments)    Difficulty swallowing  . Atorvastatin Other (See Comments)    Leg cramping  . Bactrim [Sulfamethoxazole-Trimethoprim] Nausea And Vomiting  . Macrodantin [Nitrofurantoin Macrocrystal] Other (See Comments)    Unknown reaction  . Simvastatin Other (See Comments)    Leg cramping  . Sulfa Antibiotics Nausea And Vomiting  . Azithromycin Rash    Social History:   Social History   Socioeconomic History  . Marital status: Married    Spouse name: Not on file  . Number of children: Not on file  . Years of education: Not on file  . Highest education level: Not on file  Occupational History  . Occupation: Retired  Tobacco Use  . Smoking status: Never Smoker  . Smokeless tobacco: Never Used  Substance and Sexual Activity  . Alcohol use: No  . Drug use: No  . Sexual activity: Not on file  Other Topics Concern  . Not on file  Social History Narrative   Lives in 1 story apartment on the 2nd floor, with her husband   Has 3 adult children   4 year degree   Retired 1st grade teacher   Social Determinants of Radio broadcast assistant Strain:   . Difficulty of Paying Living Expenses:   Food Insecurity:   . Worried About Charity fundraiser in  the Last Year:   . [place] in the Last Year:   Transportation Needs:   . Film/video editor (Medical):   [name] Kitchen Lack of Transportation (Non-Medical):   Physical Activity:   . Days of Exercise per Week:   . Minutes of Exercise per Session:   Stress:   . Feeling of Stress :   Social Connections:   . Frequency of Communication with Friends and Family:   . Frequency of Social Gatherings with Friends and Family:   . Attends Religious Services:   . Active Member of Clubs or Organizations:   . Attends Archivist Meetings:   [name] Kitchen Marital Status:    Intimate Partner Violence:   . Fear of Current or Ex-Partner:   . Emotionally Abused:   [name] Kitchen Physically Abused:   . Sexually Abused:     Family History:    Family History  Problem Relation Age of Onset  . Arthritis Mother        Died at 22  . Stroke Father 75       Died at age 48  . CAD Neg Hx      ROS:  Please see the history of present illness.  All other ROS reviewed and negative.     Physical Exam/Data:   Vitals:   05/18/20 0515 05/18/20 0520 05/18/20 0753 05/18/20 1130  BP: (!) 146/86 113/90 (!) 152/70 (!) 132/55  Pulse: 92  71 86  Resp: 18  18 18   Temp: 98.1 F (36.7 C)  98.1 F (36.7 C) 98.5 F (36.9 C)  TempSrc: Oral  Oral Oral  SpO2: 98%  99% 98%  Weight: 47.2 kg     Height:        Intake/Output Summary (Last 24 hours) at 05/18/2020 1417 Last data filed at 05/18/2020 1214 Gross per 24 hour  Intake 240 ml  Output 1050 ml  Net -810 ml   Last 3 Weights 05/18/2020 05/17/2020 05/04/2020  Weight (lbs) 104 lb 125 lb 10.6 oz 126 lb 12.2 oz  Weight (kg) 47.174 kg 57 kg 57.5 kg     Body mass index is 21.01 kg/m.  General:  Well nourished, well developed, in no acute distress HEENT: normal Lymph: no adenopathy Neck: no JVD Endocrine:  No thryomegaly Vascular: No carotid bruits; FA pulses 2+ bilaterally without bruits  Cardiac:  normal S1, S2; 2/6 systolic murmur rusb, no jvd Lungs:  clear to auscultation bilaterally, no wheezing, rhonchi or rales  Abd: soft, nontender, no hepatomegaly  Ext: no edema Musculoskeletal:  No deformities, BUE and BLE strength normal and equal Skin: warm and dry  Neuro:  CNs 2-12 intact, no focal abnormalities noted Psych:  Normal affect    Laboratory Data:  High Sensitivity Troponin:   Recent Labs  Lab 05/02/20 1709 05/02/20 1942  TROPONINIHS 7 7     Chemistry Recent Labs  Lab 05/17/20 1526  NA 138  K 3.6  CL 105  CO2 24  GLUCOSE 109*  BUN 17  CREATININE 0.76  CALCIUM 8.8*  GFRNONAA >60  GFRAA >60  ANIONGAP 9     Recent Labs  Lab 05/17/20 1526  PROT 5.9*  ALBUMIN 3.2*  AST 16  ALT 10  ALKPHOS 59  BILITOT 0.6   Hematology Recent Labs  Lab 05/17/20 1526 05/17/20 2219 05/18/20 0511  WBC 8.5  --  5.6  RBC 3.62*  --  3.64*  HGB 10.8* 10.8* 10.9*  HCT 34.3* 33.2* 33.7*  MCV 94.8  --  92.6  MCH 29.8  --  29.9  MCHC 31.5  --  32.3  RDW 13.2  --  13.2  PLT 408*  --  421*   BNPNo results for input(s): BNP, PROBNP in the last 168 hours.  DDimer No results for input(s): DDIMER in the last 168 hours.   Radiology/Studies:  DG Chest 1 View  Result Date: 05/17/2020 CLINICAL DATA:  Syncope. EXAM: CHEST  1 VIEW COMPARISON:  Apr 13, 2020 FINDINGS: There is no evidence of acute infiltrate, pleural effusion or pneumothorax. The heart size and mediastinal contours are within normal limits. The visualized skeletal structures are unremarkable. IMPRESSION: No active disease. Electronically Signed   By: Virgina Norfolk M.D.   On: 05/17/2020 15:57   {   Assessment and Plan:   1. Syncope . Intermittent episodes over the last few years with negative workup including echo and prior event monitor. Only abnormality noted is orthostatic hypotension, she was recently tried on florinef. Unclear if etiology as symptoms occur while sitting - currently has loop recorder for cryptogenic stroke, PA Dunn had been in touch with loop rep and notified no events at time of her episode - severely orthostatic by vital signs. SBP 146-->92 with standing, DBP 86-->47 - on low dose florinef, dose and any change would take a few weeks to take effect. With the frequency of her symptoms would look for more immediate affect, would strop florinef and add midodrine 2.5mg  bid, can titrate as needed. We will tolerate high bp's at [place].    - continue loop recording monitor at home. Appears we may not have gotten all the data from interrogation as patient was brought to the hospital right after event and did not have time for home  transmission, rep is to come in and recheck device today   2. +FOBT - seen by GI - plavix on hold (had been on since CVA)   For questions or updates, please contact [place] Please consult [email or web address] for contact info under    Signed, Carlyle Dolly, MD  05/18/2020 2:17 PM

## 2020-05-18 NOTE — Progress Notes (Addendum)
Received sign-out from overnight fellow that hospitalist team requested loop recorder interrogation. Have paged [employer] rep on call to interrogate device and relay results to IM primary provider.  Addendum 11:45 AM - per d/w rep, no associated arrhythmias during episode so ILR was reassuring.  Update 3:15pm: Dr. Harl Bowie clarified event was 2pm on Saturday so I called [employer] rep to clarify if interrogation would have specifically been looking at this time. He confirmed it would not have necessarily been during that time so he will formally interrogate while she's here and he will notify Dr. Harl Bowie. Natalina Wieting PA-C

## 2020-05-19 DIAGNOSIS — R195 Other fecal abnormalities: Secondary | ICD-10-CM

## 2020-05-19 LAB — CBC WITH DIFFERENTIAL/PLATELET
Abs Immature Granulocytes: 0.03 10*3/uL (ref 0.00–0.07)
Basophils Absolute: 0.1 10*3/uL (ref 0.0–0.1)
Basophils Relative: 1 %
Eosinophils Absolute: 0.1 10*3/uL (ref 0.0–0.5)
Eosinophils Relative: 2 %
HCT: 30.6 % — ABNORMAL LOW (ref 36.0–46.0)
Hemoglobin: 10 g/dL — ABNORMAL LOW (ref 12.0–15.0)
Immature Granulocytes: 1 %
Lymphocytes Relative: 16 %
Lymphs Abs: 0.9 10*3/uL (ref 0.7–4.0)
MCH: 29.9 pg (ref 26.0–34.0)
MCHC: 32.7 g/dL (ref 30.0–36.0)
MCV: 91.6 fL (ref 80.0–100.0)
Monocytes Absolute: 0.4 10*3/uL (ref 0.1–1.0)
Monocytes Relative: 7 %
Neutro Abs: 4.1 10*3/uL (ref 1.7–7.7)
Neutrophils Relative %: 73 %
Platelets: 383 10*3/uL (ref 150–400)
RBC: 3.34 MIL/uL — ABNORMAL LOW (ref 3.87–5.11)
RDW: 13.2 % (ref 11.5–15.5)
WBC: 5.6 10*3/uL (ref 4.0–10.5)
nRBC: 0 % (ref 0.0–0.2)

## 2020-05-19 LAB — GLUCOSE, CAPILLARY: Glucose-Capillary: 98 mg/dL (ref 70–99)

## 2020-05-19 MED ORDER — ENSURE ENLIVE PO LIQD
237.0000 mL | Freq: Two times a day (BID) | ORAL | Status: DC
  Administered 2020-05-20 – 2020-05-22 (×5): 237 mL via ORAL

## 2020-05-19 MED ORDER — ADULT MULTIVITAMIN W/MINERALS CH
1.0000 | ORAL_TABLET | Freq: Every day | ORAL | Status: DC
  Administered 2020-05-19 – 2020-05-22 (×4): 1 via ORAL
  Filled 2020-05-19 (×4): qty 1

## 2020-05-19 MED ORDER — CHLORHEXIDINE GLUCONATE CLOTH 2 % EX PADS: 6.0000 | MEDICATED_PAD | Freq: Every day | CUTANEOUS | Status: DC

## 2020-05-19 MED ORDER — PANTOPRAZOLE SODIUM 40 MG PO TBEC
40.0000 mg | DELAYED_RELEASE_TABLET | Freq: Two times a day (BID) | ORAL | Status: DC
  Administered 2020-05-19 – 2020-05-22 (×6): 40 mg via ORAL
  Filled 2020-05-19 (×8): qty 1

## 2020-05-19 NOTE — Social Work (Signed)
CSW met with patient and daughter [name] bedside. Daughter stated patient is from [facility], where she has 24/7 care. She would like patient to return to [facility] where her aides will resume services. Patient will need transport by [facility] at discharge.   Criss Alvine, [place] Social Worker

## 2020-05-19 NOTE — Progress Notes (Signed)
PROGRESS NOTE    Sheila Parrish  DQQ:[id number] DOB: Apr 21, 1927 DOA: 05/17/2020 PCP: Lavone Orn, MD   Brief Narrative: Patient is a 7 old female with history of recurrent syncopal spell, Orthostatic hypotension, dementia, CVA, hypertension who presented to the emergency department with a syncopal episode .  She was admitted here 2 weeks ago for same reason.  Syncopal episode happened while sitting watching TV.  She readily regained her consciousness after about 20 to 30 minutes.  On presentation she was orthostatic with blood pressure of 70/ 50 mmhG. Her blood pressure quickly rebounded and she is hemodynamically stable at present.  FOBT test done in the emergency department came out a positive.  Cardiology consulted.  Assessment & Plan:   Active Problems:   Syncope   Recurrent syncope: History of chronic orthostatic hypotension.  She is on fludrocortisone.  Pressure stockings ordered.  She has a loop recorder and the recorder interrogation did not show any arrythmias.  On her last admission, there was plan for TEE but daughter declined due to risk outweighing benefit due to her advanced age.  Currently her blood pressure is stable on supine but she is profoundly orthostatic. Cardiology following.  Fludrocortisone was stopped, started on midodrine. Started on compression hose, abdominal binder.  2D echo ordered.  Suspicion for GI bleed: FOBT was done in the emergency department and found to be  positive.  She is on aspirin and Plavix for recent stroke.  Plavix has been held, aspirin continued.  Hemoglobin has remained stable. No plan for intervention.  Currently she is on PPI twice daily.  No report of hematochezia or melena.  Urinary retention: Foley placed yesterday due to retention.  We will give a voiding trial today.  Dementia: Continue supportive care.  Debility/deconditioning: Seen by physical therapy and recommended home with PT on discharge.         DVT  prophylaxis:SCD Code Status: DNR Family Communication: SCD Status is: Inpatient  Remains inpatient appropriate because:Unsafe d/c plan   Dispo: The patient is from: Home              Anticipated d/c is to: Home              Anticipated d/c date is: 1 day              Patient currently is not medically stable to d/c. Home with home health tomorrow after cardiology clearance.   Consultants: Cardiology  Procedures:None  Antimicrobials:  Anti-infectives (From admission, onward)   None      Subjective: Patient seen and examined at the bedside this morning.  Hemodynamically stable.  Lying in bed.  Confused but comfortable.  Plan for Foley removal.  Objective: Vitals:   05/18/20 1658 05/18/20 2015 05/18/20 2015 05/19/20 0732  BP: 124/62 (!) 152/70 (!) 152/70 (!) 153/70  Pulse: 91 89 89 85  Resp: 20 18 18 18   Temp:  98.8 F (37.1 C) 98.8 F (37.1 C) 98.7 F (37.1 C)  TempSrc:  Oral Oral Oral  SpO2: 97% 97% 97% 99%  Weight:      Height:        Intake/Output Summary (Last 24 hours) at 05/19/2020 0843 Last data filed at 05/18/2020 2016 Gross per 24 hour  Intake 680 ml  Output 1000 ml  Net -320 ml   Filed Weights   05/17/20 1439 05/18/20 0515  Weight: 57 kg 47.2 kg    Examination:  General exam: Extremely deconditioned, debilitated elderly female HEENT: Facial ecchymosis  Respiratory system: Bilateral equal air entry, normal vesicular breath sounds, no wheezes or crackles  Cardiovascular system: S1 & S2 heard, RRR. No JVD, murmurs, rubs, gallops or clicks. Gastrointestinal system: Abdomen is nondistended, soft and nontender. No organomegaly or masses felt. Normal bowel sounds heard. Central nervous system: Alert and awake but not oriented  extremities: No edema, no clubbing ,no cyanosis Skin: No rashes, lesions or ulcers,no icterus ,no pallor   Data Reviewed: I have personally reviewed following labs and imaging studies  CBC: Recent Labs  Lab 05/17/20 1526  05/17/20 2219 05/18/20 0511 05/19/20 0511  WBC 8.5  --  5.6 5.6  NEUTROABS 7.2  --   --  4.1  HGB 10.8* 10.8* 10.9* 10.0*  HCT 34.3* 33.2* 33.7* 30.6*  MCV 94.8  --  92.6 91.6  PLT 408*  --  421* 086   Basic Metabolic Panel: Recent Labs  Lab 05/17/20 1526  NA 138  K 3.6  CL 105  CO2 24  GLUCOSE 109*  BUN 17  CREATININE 0.76  CALCIUM 8.8*   GFR: Estimated Creatinine Clearance: 31.2 mL/min (by C-G formula based on SCr of 0.76 mg/dL). Liver Function Tests: Recent Labs  Lab 05/17/20 1526  AST 16  ALT 10  ALKPHOS 59  BILITOT 0.6  PROT 5.9*  ALBUMIN 3.2*   No results for input(s): LIPASE, AMYLASE in the last 168 hours. No results for input(s): AMMONIA in the last 168 hours. Coagulation Profile: Recent Labs  Lab 05/17/20 1526  INR 1.1   Cardiac Enzymes: No results for input(s): CKTOTAL, CKMB, CKMBINDEX, TROPONINI in the last 168 hours. BNP (last 3 results) No results for input(s): PROBNP in the last 8760 hours. HbA1C: No results for input(s): HGBA1C in the last 72 hours. CBG: Recent Labs  Lab 05/17/20 1529 05/18/20 0511 05/19/20 0653  GLUCAP 86 84 98   Lipid Profile: No results for input(s): CHOL, HDL, LDLCALC, TRIG, CHOLHDL, LDLDIRECT in the last 72 hours. Thyroid Function Tests: No results for input(s): TSH, T4TOTAL, FREET4, T3FREE, THYROIDAB in the last 72 hours. Anemia Panel: No results for input(s): VITAMINB12, FOLATE, FERRITIN, TIBC, IRON, RETICCTPCT in the last 72 hours. Sepsis Labs: No results for input(s): PROCALCITON, LATICACIDVEN in the last 168 hours.  Recent Results (from the past 240 hour(s))  SARS Coronavirus 2 by RT PCR (hospital order, performed in [facility] hospital lab) Nasopharyngeal Nasopharyngeal Swab     Status: None   Collection Time: 05/17/20  8:47 PM   Specimen: Nasopharyngeal Swab  Result Value Ref Range Status   SARS Coronavirus 2 NEGATIVE NEGATIVE Final    Comment: (NOTE) SARS-CoV-2 target nucleic acids are NOT  DETECTED. The SARS-CoV-2 RNA is generally detectable in upper and lower respiratory specimens during the acute phase of infection. The lowest concentration of SARS-CoV-2 viral copies this assay can detect is 250 copies / mL. A negative result does not preclude SARS-CoV-2 infection and should not be used as the sole basis for treatment or other patient management decisions.  A negative result may occur with improper specimen collection / handling, submission of specimen other than nasopharyngeal swab, presence of viral mutation(s) within the areas targeted by this assay, and inadequate number of viral copies (<250 copies / mL). A negative result must be combined with clinical observations, patient history, and epidemiological information. Fact Sheet for Patients:   StrictlyIdeas.no Fact Sheet for Healthcare Providers: BankingDealers.co.za This test is not yet approved or cleared  by the [place] FDA and has been authorized for detection  and/or diagnosis of SARS-CoV-2 by FDA under an Emergency Use Authorization (EUA).  This EUA will remain in effect (meaning this test can be used) for the duration of the COVID-19 declaration under Section 564(b)(1) of the Act, 21 U.S.C. section 360bbb-3(b)(1), unless the authorization is terminated or revoked sooner. Performed at [facility], [place] [address], [place], [place] [address]          Radiology Studies: DG Chest 1 View  Result Date: 05/17/2020 CLINICAL DATA:  Syncope. EXAM: CHEST  1 VIEW COMPARISON:  Apr 13, 2020 FINDINGS: There is no evidence of acute infiltrate, pleural effusion or pneumothorax. The heart size and mediastinal contours are within normal limits. The visualized skeletal structures are unremarkable. IMPRESSION: No active disease. Electronically Signed   By: Virgina Norfolk M.D.   On: 05/17/2020 15:57        Scheduled Meds: . aspirin  325 mg Oral Daily  .  Chlorhexidine Gluconate Cloth  6 each Topical Daily  . Chlorhexidine Gluconate Cloth  6 each Topical Daily  . midodrine  2.5 mg Oral BID  . pantoprazole (PROTONIX) IV  40 mg Intravenous Q12H  . sodium chloride flush  3 mL Intravenous Q12H   Continuous Infusions:   LOS: 2 days    Time spent:35 mins, More than 50% of that time was spent in counseling and/or coordination of care.      Shelly Coss, MD [facility] P6/06/2020, 8:43 AM

## 2020-05-19 NOTE — Progress Notes (Signed)
Pt unable to void on her own, bladder scan showing 353ml. [place], NP notified and order to insert foley cath obtained.

## 2020-05-19 NOTE — Progress Notes (Signed)
Pt unable to void, bladder scan done showed 160ml. Will continue to monitor.

## 2020-05-19 NOTE — Progress Notes (Signed)
[facility] Gastroenterology Progress Note  Sheila Parrish 86 y.o. 03-Sep-1927  CC:  Anemia, FOBT-positive stools   Subjective: Patient remains disoriented and cannot provide any subjective data.  Per RN, no melenic or bloody stools or any signs of GI bleeding.  ROS : Limited by patient status (confusion)   Objective: Vital signs in last 24 hours: Vitals:   05/18/20 2015 05/19/20 0732  BP: (!) 152/70 (!) 153/70  Pulse: 89 85  Resp: 18 18  Temp: 98.8 F (37.1 C) 98.7 F (37.1 C)  SpO2: 97% 99%    Physical Exam:  General:  Awake, confused, elderly, no distress  Head:  Normocephalic, facial ecchymosis   Eyes:  Anicteric sclera, EOMs intact  Lungs:   Clear to auscultation bilaterally, respirations unlabored  Heart:  Regular rate and rhythm, S1, S2 normal  Abdomen:   Soft, non-tender (no grimace), non-distended, bowel sounds active all four quadrants,  no guarding or peritoneal signs  Extremities: Extremities normal, atraumatic, no  edema  Pulses: 2+ and symmetric    Lab Results: Recent Labs    05/17/20 1526  NA 138  K 3.6  CL 105  CO2 24  GLUCOSE 109*  BUN 17  CREATININE 0.76  CALCIUM 8.8*   Recent Labs    05/17/20 1526  AST 16  ALT 10  ALKPHOS 59  BILITOT 0.6  PROT 5.9*  ALBUMIN 3.2*   Recent Labs    05/17/20 1526 05/17/20 2219 05/18/20 0511 05/19/20 0511  WBC 8.5  --  5.6 5.6  NEUTROABS 7.2  --   --  4.1  HGB 10.8*   < > 10.9* 10.0*  HCT 34.3*   < > 33.7* 30.6*  MCV 94.8  --  92.6 91.6  PLT 408*  --  421* 383   < > = values in this interval not displayed.   Recent Labs    05/17/20 1526  LABPROT 14.2  INR 1.1    Assessment/Plan: Anemia, FOBT-positive stools -Hgb 10.0, stable, no signs of GI bleeding -Plavix on hold.  If continued, recommend indefinite 4mq Protonix qd for prophylaxis. -Due to comorbidities, we will defer any endoscopic/colonoscopic evaluation unless destabilizing bleeding occurs.    [facility] will sign off. Please contact  us if we can be of any further assistance during this hospital stay.  Salley Slaughter PA-C 05/19/2020, 10:56 AM  Contact #  [phone]

## 2020-05-19 NOTE — Progress Notes (Signed)
Initial Nutrition Assessment  DOCUMENTATION CODES:   Not applicable  INTERVENTION:   -Ensure Enlive po BID, each supplement provides 350 kcal and 20 grams of protein -Magic cup BID with meals, each supplement provides 290 kcal and 9 grams of protein -MVI with minerals daily  NUTRITION DIAGNOSIS:   Inadequate oral intake related to decreased appetite as evidenced by meal completion < 50%.  GOAL:   Patient will meet greater than or equal to 90% of their needs  MONITOR:   PO intake, Supplement acceptance, Labs, Weight trends, Skin, I & O's  REASON FOR ASSESSMENT:   Malnutrition Screening Tool    ASSESSMENT:   Sheila Parrish is a 86 y.o. female with medical history significant of recurrent syncopal spells, orthostatic hypotension, dementia, CVA, and HTN presented to ED after suffering a syncopal spell. Pt was admitted to hospital two weeks ago for same reason. Pt daughter at bedside who talked to pt's caregiver and who reported this happened while patient was sitting watching TV. Pt seemed to recover consciousness after about 20-30 min estimated by daughter.  Pt admitted with recurrent syncope.   Reviewed I/O's: -320 ml x 24 hours and -1.4 L since admission  UOP: 1 L x 24 hours  Pt sleeping soundly at times of visits. Pt unable to provide additional history secondary to dementia.   Pt with poor oral intake; noted meal completion 25-50%.   Reviewed wt hx; noted significant difference between admission weight and last recorded wt (57 kg vs 47.2 kg). Given pt's previous wt hx, suspect 57 kg is closer to pt's actual body weight, so this was used to calculate pt's needs. Noted pt has experienced a 8.8% wt loss over the past 2 months, which is significant for time frame.   Per chart review, plan to discharge back to [facility] tomorrow.   Labs reviewed: CBGS: 84.   Diet Order:   Diet Order            Diet regular Room service appropriate? Yes; Fluid consistency: Thin   Diet effective now              EDUCATION NEEDS:   No education needs have been identified at this time  Skin:  Skin Assessment: Reviewed RN Assessment  Last BM:  05/17/20  Height:   Ht Readings from Last 1 Encounters:  05/17/20 4\' 11"  (1.499 m)    Weight:   Wt Readings from Last 1 Encounters:  05/18/20 47.2 kg    Ideal Body Weight:  44.5 kg  BMI:  Body mass index is 21.01 kg/m.  Estimated Nutritional Needs:   Kcal:  1500-1700  Protein:  70-85 grams  Fluid:  > 1.5 L    Loistine Chance, RD, LDN, [place] Registered Dietitian II Certified Diabetes Care and Education Specialist Please refer to [facility] for RD and/or RD on-call/weekend/after hours pager

## 2020-05-19 NOTE — Progress Notes (Signed)
I & O cath done 500 cc urine return, patient tolerate well , report given to night RN. Will continue to monitor the patient.

## 2020-05-19 NOTE — Progress Notes (Addendum)
Progress Note  Patient Name: Sheila Parrish Date of Encounter: 05/19/2020  Primary Cardiologist: Fransico Him, MD   Subjective   Denies any dizziness or further syncope.  Having difficulty urinating  Inpatient Medications    Scheduled Meds: . aspirin  325 mg Oral Daily  . Chlorhexidine Gluconate Cloth  6 each Topical Daily  . Chlorhexidine Gluconate Cloth  6 each Topical Daily  . midodrine  2.5 mg Oral BID  . pantoprazole (PROTONIX) IV  40 mg Intravenous Q12H  . sodium chloride flush  3 mL Intravenous Q12H   Continuous Infusions:  PRN Meds: acetaminophen, hydrALAZINE   Vital Signs    Vitals:   05/18/20 1658 05/18/20 2015 05/18/20 2015 05/19/20 0732  BP: 124/62 (!) 152/70 (!) 152/70 (!) 153/70  Pulse: 91 89 89 85  Resp: 20 18 18 18   Temp:  98.8 F (37.1 C) 98.8 F (37.1 C) 98.7 F (37.1 C)  TempSrc:  Oral Oral Oral  SpO2: 97% 97% 97% 99%  Weight:      Height:        Intake/Output Summary (Last 24 hours) at 05/19/2020 0853 Last data filed at 05/18/2020 2016 Gross per 24 hour  Intake 680 ml  Output 1000 ml  Net -320 ml   Filed Weights   05/17/20 1439 05/18/20 0515  Weight: 57 kg 47.2 kg    Telemetry    NSR with short burst of Nonsustained atrial tach - Personally Reviewed  ECG    No new EKG to review - Personally Reviewed  Physical Exam   GEN: No acute distress.   Neck: No JVD Cardiac: RRR, no murmurs, rubs, or gallops.  Respiratory: Clear to auscultation bilaterally. GI: Soft, nontender, non-distended  MS: No edema; No deformity. Neuro:  Nonfocal  Psych: Normal affect   Labs    Chemistry Recent Labs  Lab 05/17/20 1526  NA 138  K 3.6  CL 105  CO2 24  GLUCOSE 109*  BUN 17  CREATININE 0.76  CALCIUM 8.8*  PROT 5.9*  ALBUMIN 3.2*  AST 16  ALT 10  ALKPHOS 59  BILITOT 0.6  GFRNONAA >60  GFRAA >60  ANIONGAP 9     Hematology Recent Labs  Lab 05/17/20 1526 05/17/20 1526 05/17/20 2219 05/18/20 0511 05/19/20 0511  WBC 8.5  --    --  5.6 5.6  RBC 3.62*  --   --  3.64* 3.34*  HGB 10.8*   < > 10.8* 10.9* 10.0*  HCT 34.3*   < > 33.2* 33.7* 30.6*  MCV 94.8  --   --  92.6 91.6  MCH 29.8  --   --  29.9 29.9  MCHC 31.5  --   --  32.3 32.7  RDW 13.2  --   --  13.2 13.2  PLT 408*  --   --  421* 383   < > = values in this interval not displayed.    Cardiac EnzymesNo results for input(s): TROPONINI in the last 168 hours. No results for input(s): TROPIPOC in the last 168 hours.   BNPNo results for input(s): BNP, PROBNP in the last 168 hours.   DDimer No results for input(s): DDIMER in the last 168 hours.   Radiology    DG Chest 1 View  Result Date: 05/17/2020 CLINICAL DATA:  Syncope. EXAM: CHEST  1 VIEW COMPARISON:  Apr 13, 2020 FINDINGS: There is no evidence of acute infiltrate, pleural effusion or pneumothorax. The heart size and mediastinal contours are within normal limits.  The visualized skeletal structures are unremarkable. IMPRESSION: No active disease. Electronically Signed   By: Virgina Norfolk M.D.   On: 05/17/2020 15:57    Cardiac Studies   2D echo 05/03/2020 IMPRESSIONS   1. Left ventricular ejection fraction, by estimation, is 65 to 70%. The  left ventricle has normal function. The left ventricle has no regional  wall motion abnormalities. Left ventricular diastolic parameters are  consistent with Grade I diastolic  dysfunction (impaired relaxation).  2. Right ventricular systolic function is normal. The right ventricular  size is normal. There is mildly elevated pulmonary artery systolic  pressure. The estimated right ventricular systolic pressure is 92.3 mmHg.  3. Left atrial size was mildly dilated.  4. The mitral valve is normal in structure. Mild mitral valve  regurgitation. No evidence of mitral stenosis.  5. The aortic valve is normal in structure. Aortic valve regurgitation is  mild. Mild to moderate aortic valve sclerosis/calcification is present,  without any evidence of aortic  stenosis.  6. The inferior vena cava is dilated in size with <50% respiratory  variability, suggesting right atrial pressure of 15 mmHg.   Patient Profile     86 y.o. female with a hx of cryptogenic stroke, syncope, dementia  who is being seen  for the evaluation of syncope at the request of Dr. Tawanna Solo   Assessment & Plan    1.  Syncope -orthostatics markedly abnormal with SBP dropping from 146>>92 upon standing -her syncopal episode, though, are while she was sitting -IRL interrogated and no arrhythmias but tele here reviewed and showed short bursts of atrial tachy up to 4-5 beats -Midodrine started yesterday -will repeat orthostatics today and titrate Midodrine as needed -add compression hose and abdominal binder -2D echo normal  2.  +FOBT -GI following -Plavix for prior CVA currently on hold       For questions or updates, please contact [place] Please consult [email or web address] for contact info under Cardiology/STEMI.      Signed, Fransico Him, MD  05/19/2020, 8:53 AM

## 2020-05-20 ENCOUNTER — Inpatient Hospital Stay (HOSPITAL_COMMUNITY): Payer: Medicare PPO

## 2020-05-20 LAB — BASIC METABOLIC PANEL
Anion gap: 13 (ref 5–15)
BUN: 12 mg/dL (ref 8–23)
CO2: 24 mmol/L (ref 22–32)
Calcium: 8.7 mg/dL — ABNORMAL LOW (ref 8.9–10.3)
Chloride: 104 mmol/L (ref 98–111)
Creatinine, Ser: 0.85 mg/dL (ref 0.44–1.00)
GFR calc Af Amer: >60 mL/min (ref >60)
GFR calc non Af Amer: 60 mL/min — ABNORMAL LOW (ref >60)
Glucose, Bld: 145 mg/dL — ABNORMAL HIGH (ref 70–99)
Potassium: 3.5 mmol/L (ref 3.5–5.1)
Sodium: 141 mmol/L (ref 135–145)

## 2020-05-20 LAB — GLUCOSE, CAPILLARY: Glucose-Capillary: 86 mg/dL (ref 70–99)

## 2020-05-20 MED ORDER — SENNOSIDES-DOCUSATE SODIUM 8.6-50 MG PO TABS
1.0000 | ORAL_TABLET | Freq: Two times a day (BID) | ORAL | Status: DC
  Administered 2020-05-20 – 2020-05-22 (×5): 1 via ORAL
  Filled 2020-05-20 (×5): qty 1

## 2020-05-20 MED ORDER — POLYETHYLENE GLYCOL 3350 17 G PO PACK
17.0000 g | PACK | Freq: Every day | ORAL | Status: DC
  Administered 2020-05-20 – 2020-05-21 (×2): 17 g via ORAL
  Filled 2020-05-20 (×3): qty 1

## 2020-05-20 NOTE — Progress Notes (Signed)
Progress Note  Patient Name: Sheila Parrish Date of Encounter: 05/20/2020  Primary Cardiologist: Fransico Him, MD   Subjective   Denies any chest pain, SOB or dizziness. Still orthostatic yesterday when standing.    Inpatient Medications    Scheduled Meds: . aspirin  325 mg Oral Daily  . Chlorhexidine Gluconate Cloth  6 each Topical Daily  . Chlorhexidine Gluconate Cloth  6 each Topical Daily  . feeding supplement (ENSURE ENLIVE)  237 mL Oral BID BM  . midodrine  2.5 mg Oral BID  . multivitamin with minerals  1 tablet Oral Daily  . pantoprazole  40 mg Oral BID  . sodium chloride flush  3 mL Intravenous Q12H   Continuous Infusions:  PRN Meds: acetaminophen, hydrALAZINE   Vital Signs    Vitals:   05/19/20 1123 05/19/20 1618 05/19/20 2053 05/20/20 0509  BP: (!) 117/57 (!) 122/59 (!) 157/75 (!) 150/86  Pulse: 84 73 91 70  Resp: 16 16 18 16   Temp: 98.7 F (37.1 C) 98 F (36.7 C) (!) 97.4 F (36.3 C) 98.3 F (36.8 C)  TempSrc: Oral  Oral Oral  SpO2: 95% 93% 100% 97%  Weight:      Height:        Intake/Output Summary (Last 24 hours) at 05/20/2020 0813 Last data filed at 05/20/2020 0430 Gross per 24 hour  Intake 240 ml  Output 538 ml  Net -298 ml   Filed Weights   05/17/20 1439 05/18/20 0515  Weight: 57 kg 47.2 kg    Telemetry    NSR  - Personally Reviewed  ECG    No new EKG to review - Personally Reviewed  Physical Exam   GEN: Well nourished, well developed in no acute distress HEENT: Normal NECK: No JVD; No carotid bruits LYMPHATICS: No lymphadenopathy CARDIAC:RRR, no murmurs, rubs, gallops RESPIRATORY:  Clear to auscultation without rales, wheezing or rhonchi  ABDOMEN: Soft, non-tender, non-distended MUSCULOSKELETAL:  No edema; No deformity  SKIN: Warm and dry NEUROLOGIC:  Alert and oriented x 3 PSYCHIATRIC:  Normal affect    Labs    Chemistry Recent Labs  Lab 05/17/20 1526  NA 138  K 3.6  CL 105  CO2 24  GLUCOSE 109*  BUN 17    CREATININE 0.76  CALCIUM 8.8*  PROT 5.9*  ALBUMIN 3.2*  AST 16  ALT 10  ALKPHOS 59  BILITOT 0.6  GFRNONAA >60  GFRAA >60  ANIONGAP 9     Hematology Recent Labs  Lab 05/17/20 1526 05/17/20 1526 05/17/20 2219 05/18/20 0511 05/19/20 0511  WBC 8.5  --   --  5.6 5.6  RBC 3.62*  --   --  3.64* 3.34*  HGB 10.8*   < > 10.8* 10.9* 10.0*  HCT 34.3*   < > 33.2* 33.7* 30.6*  MCV 94.8  --   --  92.6 91.6  MCH 29.8  --   --  29.9 29.9  MCHC 31.5  --   --  32.3 32.7  RDW 13.2  --   --  13.2 13.2  PLT 408*  --   --  421* 383   < > = values in this interval not displayed.    Cardiac EnzymesNo results for input(s): TROPONINI in the last 168 hours. No results for input(s): TROPIPOC in the last 168 hours.   BNPNo results for input(s): BNP, PROBNP in the last 168 hours.   DDimer No results for input(s): DDIMER in the last 168 hours.  Radiology    No results found.  Cardiac Studies   2D echo 05/03/2020 IMPRESSIONS   1. Left ventricular ejection fraction, by estimation, is 65 to 70%. The  left ventricle has normal function. The left ventricle has no regional  wall motion abnormalities. Left ventricular diastolic parameters are  consistent with Grade I diastolic  dysfunction (impaired relaxation).  2. Right ventricular systolic function is normal. The right ventricular  size is normal. There is mildly elevated pulmonary artery systolic  pressure. The estimated right ventricular systolic pressure is 53.9 mmHg.  3. Left atrial size was mildly dilated.  4. The mitral valve is normal in structure. Mild mitral valve  regurgitation. No evidence of mitral stenosis.  5. The aortic valve is normal in structure. Aortic valve regurgitation is  mild. Mild to moderate aortic valve sclerosis/calcification is present,  without any evidence of aortic stenosis.  6. The inferior vena cava is dilated in size with <50% respiratory  variability, suggesting right atrial pressure of 15 mmHg.    Patient Profile     86 y.o. female with a hx of cryptogenic stroke, syncope, dementia  who is being seen  for the evaluation of syncope at the request of Dr. Tawanna Solo   Assessment & Plan    1.  Syncope -orthostatics markedly abnormal with SBP dropping from 146>>92 upon standing -her syncopal episode, though, are while she was sitting -IRL interrogated and no arrhythmias  -added compression hose and abdominal binder yesterday -2D echo normal -continue Midodrine 2.5mg  TID -follow for supine HTN while titrating midodrine -will repeat orthostatics with abdominal binder, compression hose and midodrine today  2.  +FOBT -GI following -Plavix for prior CVA currently on hold       For questions or updates, please contact [place] Please consult [email or web address] for contact info under Cardiology/STEMI.      Signed, Fransico Him, MD  05/20/2020, 8:13 AM

## 2020-05-20 NOTE — Progress Notes (Signed)
Physical Therapy Treatment Patient Details Name: Sheila Parrish MRN: [id number] DOB: [birth year] Today's Date: 05/20/2020    History of Present Illness Sheila Parrish is a 86 y.o. female with medical history significant of recurrent syncopal spells, orthostatic hypotension, dementia, CVA, ventricular bigeminy, and HTN admitted after suffering a syncopal spell.   Recent hospitalization last month for syncope with injury striking her face on the ground.    PT Comments    Pt laying in bed with eyes closed on entry, daughter in room. Pt unable to tell therapist her name but is able to respond when called by her name. Pt is limited in safe mobility by decreased cognition, in particular command follow and safety awareness, in presence of decreased strength and balance. Pt is currently maxA for bed mobility, modA for transfers and mod-min A for ambulation of 50 feet with RW. Pt wearing TED hose and abdominal binder during session to improve orthostatic BP response to positional change. Pt continues to have orthostatic response to positional change (see table below), BP drop noted well after return to sitting with ambulation. Pt exhibits increased fatigue after ambulation, but denies dizziness and is easily roused throughout observation period. D/c plans remain appropriate at this time    Orthostatic BPs                                                           BP                     HR (bpm) Supine 136/52 85  Sitting 120/69 95  Sitting after 3 min 125/79 94  Standing 116/62 101  Standing after 3 min 126/59 95  Maximum HR with ambulation   118  In sitting after ambulation  121/70 111  Sitting after 3 min  109/60 89  Sitting after 6 min  107/53 87  Sitting after 9 min  102/55 89  Sitting after 12 min  104/52 84      Follow Up Recommendations  Home health PT(return to [facility] with aide assist)     Equipment Recommendations  None recommended by PT       Precautions / Restrictions  Precautions Precautions: Fall Precaution Comments: check BP Restrictions Weight Bearing Restrictions: No    Mobility  Bed Mobility Overal bed mobility: Needs Assistance Bed Mobility: Supine to Sit;Sit to Supine     Supine to sit: Max assist;+2 for physical assistance     General bed mobility comments: maxAx2 for LE mangagement off bed, trunk to upright and pad scoot of hips to Eob  Transfers Overall transfer level: Needs assistance Equipment used: Rolling walker (2 wheeled) Transfers: Sit to/from Stand Sit to Stand: Mod assist;+2 physical assistance         General transfer comment: modAx2 for power up to standing, maximal multimodal cuing, pt with significant posterior lean to brace self against the bed in standing, increased assist needed to maintain standing for orthostatic BP measurement  Ambulation/Gait Ambulation/Gait assistance: Min assist;Mod assist Gait Distance (Feet): 50 Feet Assistive device: Rolling walker (2 wheeled) Gait Pattern/deviations: Step-to pattern;Step-through pattern;Decreased stride length;Shuffle Gait velocity: slowed Gait velocity interpretation: <1.31 ft/sec, indicative of household ambulator General Gait Details: initially modA for steadying with initial sequencing with RW. Multimodal cuing for proximity to RW, largely ignored.  pt able to progress to min A for stability once in open hallway         Balance Overall balance assessment: Needs assistance Sitting-balance support: Feet unsupported Sitting balance-Leahy Scale: Fair Sitting balance - Comments: at EOB S while taking BP   Standing balance support: Bilateral upper extremity supported Standing balance-Leahy Scale: Poor Standing balance comment: UE support for balance                            Cognition Arousal/Alertness: Awake/alert Behavior During Therapy: Flat affect Overall Cognitive Status: History of cognitive impairments - at baseline                                  General Comments: Pt with moderate dementia (per daughter-moderate); pt able to identify self and answer simple "yes/no" questions.         General Comments General comments (skin integrity, edema, etc.): Pt daughter [name] present for therapy session and is helpful in providing encouragement with mobility.       Pertinent Vitals/Pain Pain Assessment: Faces Faces Pain Scale: Hurts a little bit Pain Location: grimace with bed mobility difficult to determine location of pain  Pain Descriptors / Indicators: Discomfort;Grimacing           PT Goals (current goals can now be found in the care plan section) Acute Rehab PT Goals Patient Stated Goal: To reduce falls risk PT Goal Formulation: With family Time For Goal Achievement: 06/01/20 Potential to Achieve Goals: Fair Progress towards PT goals: Progressing toward goals    Frequency    Min 3X/week      PT Plan Current plan remains appropriate       AM-PAC PT "6 Clicks" Mobility   Outcome Measure  Help needed turning from your back to your side while in a flat bed without using bedrails?: A Lot Help needed moving from lying on your back to sitting on the side of a flat bed without using bedrails?: Total Help needed moving to and from a bed to a chair (including a wheelchair)?: A Lot Help needed standing up from a chair using your arms (e.g., wheelchair or bedside chair)?: A Lot Help needed to walk in hospital room?: A Lot Help needed climbing 3-5 steps with a railing? : Total 6 Click Score: 10    End of Session Equipment Utilized During Treatment: Gait belt Activity Tolerance: Patient tolerated treatment well Patient left: with call bell/phone within reach;with family/visitor present;in chair;with chair alarm set Nurse Communication: Mobility status;Other (comment)(orthostatics) PT Visit Diagnosis: Other abnormalities of gait and mobility (R26.89);Repeated falls (R29.6)     Time: 7939-0300 PT  Time Calculation (min) (ACUTE ONLY): 50 min  Charges:  $Gait Training: 8-22 mins $Therapeutic Activity: 23-37 mins                     Tigerlily Christine B. Migdalia Dk PT, DPT Acute Rehabilitation Services Pager [phone] Office [phone]    [place]vell 05/20/2020, 11:32 AM

## 2020-05-20 NOTE — Progress Notes (Signed)
PROGRESS NOTE    Sheila Parrish  XLK:[id number] DOB: 19-Oct-1927 DOA: 05/17/2020 PCP: Lavone Orn, MD   Brief Narrative: Patient is a 69 old female with history of recurrent syncopal spell, Orthostatic hypotension, dementia, CVA, hypertension who presented to the emergency department with a syncopal episode .  She was admitted here 2 weeks ago for same reason.  Syncopal episode happened while sitting watching TV.  She readily regained her consciousness after about 20 to 30 minutes.  On presentation she was orthostatic with blood pressure of 70/ 50 mmhG. Her blood pressure quickly rebounded and she is hemodynamically stable at present.  FOBT test done in the emergency department also came out a positive.  Cardiology consulted.  She has been started on compression stockings, abdominal binder.  She was still orthostatic today as per PT assessment.   Assessment & Plan:   Active Problems:   Syncope   Recurrent syncope: History of chronic orthostatic hypotension.  Most likely secondary to autonomic dysfunction.   She was on fludrocortisone.    She has a loop recorder and the recorder interrogation did not show any arrythmias.  On her last admission, there was plan for TEE but daughter declined due to risk outweighing benefit due to her advanced age.   Cardiology following.  Fludrocortisone was stopped, started on midodrine. Started on compression hose, abdominal binder.  Currently her blood pressure is stable on supine but she is  Orthostatic. Physical therapy's assessment today did not show significant drop on systolic blood pressure on standing but it dropped after sitting after ambulation. Echocardiogram done on May this year showed ejection fraction of 65 to 36%, grade 1 diastolic dysfunction. Our plan is to continue current management today.  We will check orthostatic tomorrow.  Suspicion for GI bleed: FOBT was done in the emergency department and found to be  positive.  She is on aspirin and  Plavix for recent stroke.  Plavix has been held, aspirin continued.  Hemoglobin has remained stable. No plan for intervention.  Currently she is on PPI twice daily.  No report of hematochezia or melena.  Urinary retention: Foley was placed earlier which has been removed.  Continue in and out as needed.  Goal is to avoid Foley as much as possible on discharge.  Dementia: Continue supportive care.  Debility/deconditioning: Seen by physical therapy and recommended home with PT on discharge.  Vascular lesion on the right lateral thigh: Looks like hemangioma, ultrasound of the right thigh has been ordered as per daughter's request.  Nutrition Problem: Inadequate oral intake Etiology: decreased appetite      DVT prophylaxis:SCD Code Status: DNR Family Communication: Daughter was present at the bedside Status is: Inpatient  Remains inpatient appropriate because:Unsafe d/c plan   Dispo: The patient is from: Home              Anticipated d/c is to: Home              Anticipated d/c date is: 1 day              Patient currently is not medically stable to d/c. Home with home health tomorrow .  Will need cardiology clearance before discharge.  Will check orthostatic tomorrow morning.  Not expecting significant change tomorrow but as per family's request and cardiology's recommendation ,we didn't discharge her today.   Consultants: Cardiology  Procedures:None  Antimicrobials:  Anti-infectives (From admission, onward)   None      Subjective: Patient seen and examined at the  bedside this morning.  Hemodynamically stable.  Lying on the bed.  She was about to be worked with physical therapy.  Daughter was at the bedside.  She was finally able to void by herself  this morning.  Objective: Vitals:   05/19/20 2053 05/20/20 0509 05/20/20 1134 05/20/20 1305  BP: (!) 157/75 (!) 150/86 (!) 93/52 123/73  Pulse: 91 70 81 77  Resp: 18 16 16    Temp: (!) 97.4 F (36.3 C) 98.3 F (36.8 C) 98.2  F (36.8 C)   TempSrc: Oral Oral Oral   SpO2: 100% 97% 94%   Weight:      Height:        Intake/Output Summary (Last 24 hours) at 05/20/2020 1338 Last data filed at 05/20/2020 4098 Gross per 24 hour  Intake 360 ml  Output 88 ml  Net 272 ml   Filed Weights   05/17/20 1439 05/18/20 0515  Weight: 57 kg 47.2 kg    Examination:  General exam: Extremity condition, debilitated elderly female HEENT: Facial ecchymosis  respiratory system: Bilateral equal air entry, normal vesicular breath sounds, no wheezes or crackles  Cardiovascular system: S1 & S2 heard, RRR. No JVD, murmurs, rubs, gallops or clicks. Gastrointestinal system: Abdomen is nondistended, soft and nontender. No organomegaly or masses felt. Normal bowel sounds heard. Central nervous system: Alert and awake but not oriented Extremities: No edema, no clubbing ,no cyanosis 3X 3 cm vascular appearing mass on the lateral right thigh, nontender,no ul eration Skin: No rashes, lesions or ulcers,no icterus ,no pallor  Data Reviewed: I have personally reviewed following labs and imaging studies  CBC: Recent Labs  Lab 05/17/20 1526 05/17/20 2219 05/18/20 0511 05/19/20 0511  WBC 8.5  --  5.6 5.6  NEUTROABS 7.2  --   --  4.1  HGB 10.8* 10.8* 10.9* 10.0*  HCT 34.3* 33.2* 33.7* 30.6*  MCV 94.8  --  92.6 91.6  PLT 408*  --  421* 119   Basic Metabolic Panel: Recent Labs  Lab 05/17/20 1526  NA 138  K 3.6  CL 105  CO2 24  GLUCOSE 109*  BUN 17  CREATININE 0.76  CALCIUM 8.8*   GFR: Estimated Creatinine Clearance: 31.2 mL/min (by C-G formula based on SCr of 0.76 mg/dL). Liver Function Tests: Recent Labs  Lab 05/17/20 1526  AST 16  ALT 10  ALKPHOS 59  BILITOT 0.6  PROT 5.9*  ALBUMIN 3.2*   No results for input(s): LIPASE, AMYLASE in the last 168 hours. No results for input(s): AMMONIA in the last 168 hours. Coagulation Profile: Recent Labs  Lab 05/17/20 1526  INR 1.1   Cardiac Enzymes: No results for  input(s): CKTOTAL, CKMB, CKMBINDEX, TROPONINI in the last 168 hours. BNP (last 3 results) No results for input(s): PROBNP in the last 8760 hours. HbA1C: No results for input(s): HGBA1C in the last 72 hours. CBG: Recent Labs  Lab 05/17/20 1529 05/18/20 0511 05/19/20 0653 05/20/20 0503  GLUCAP 86 84 98 86   Lipid Profile: No results for input(s): CHOL, HDL, LDLCALC, TRIG, CHOLHDL, LDLDIRECT in the last 72 hours. Thyroid Function Tests: No results for input(s): TSH, T4TOTAL, FREET4, T3FREE, THYROIDAB in the last 72 hours. Anemia Panel: No results for input(s): VITAMINB12, FOLATE, FERRITIN, TIBC, IRON, RETICCTPCT in the last 72 hours. Sepsis Labs: No results for input(s): PROCALCITON, LATICACIDVEN in the last 168 hours.  Recent Results (from the past 240 hour(s))  SARS Coronavirus 2 by RT PCR (hospital order, performed in [facility] hospital lab)  Nasopharyngeal Nasopharyngeal Swab     Status: None   Collection Time: 05/17/20  8:47 PM   Specimen: Nasopharyngeal Swab  Result Value Ref Range Status   SARS Coronavirus 2 NEGATIVE NEGATIVE Final    Comment: (NOTE) SARS-CoV-2 target nucleic acids are NOT DETECTED. The SARS-CoV-2 RNA is generally detectable in upper and lower respiratory specimens during the acute phase of infection. The lowest concentration of SARS-CoV-2 viral copies this assay can detect is 250 copies / mL. A negative result does not preclude SARS-CoV-2 infection and should not be used as the sole basis for treatment or other patient management decisions.  A negative result may occur with improper specimen collection / handling, submission of specimen other than nasopharyngeal swab, presence of viral mutation(s) within the areas targeted by this assay, and inadequate number of viral copies (<250 copies / mL). A negative result must be combined with clinical observations, patient history, and epidemiological information. Fact Sheet for Patients:     StrictlyIdeas.no Fact Sheet for Healthcare Providers: BankingDealers.co.za This test is not yet approved or cleared  by the [place] FDA and has been authorized for detection and/or diagnosis of SARS-CoV-2 by FDA under an Emergency Use Authorization (EUA).  This EUA will remain in effect (meaning this test can be used) for the duration of the COVID-19 declaration under Section 564(b)(1) of the Act, 21 U.S.C. section 360bbb-3(b)(1), unless the authorization is terminated or revoked sooner. Performed at [facility], [place] [address], [place], [place] [address]          Radiology Studies: No results found.      Scheduled Meds: . aspirin  325 mg Oral Daily  . feeding supplement (ENSURE ENLIVE)  237 mL Oral BID BM  . midodrine  2.5 mg Oral BID  . multivitamin with minerals  1 tablet Oral Daily  . pantoprazole  40 mg Oral BID  . polyethylene glycol  17 g Oral Daily  . senna-docusate  1 tablet Oral BID  . sodium chloride flush  3 mL Intravenous Q12H   Continuous Infusions:   LOS: 3 days    Time spent:35 mins, More than 50% of that time was spent in counseling and/or coordination of care.      Shelly Coss, MD [facility] P6/07/2020, 1:38 PM

## 2020-05-20 NOTE — Progress Notes (Signed)
Patient unable to void since the foley d/c, bladder scan done MD notified, MD instruct to I & O cath till am as needed. Will continue to monitor the patient.

## 2020-05-21 DIAGNOSIS — I951 Orthostatic hypotension: Principal | ICD-10-CM

## 2020-05-21 DIAGNOSIS — F0281 Dementia in other diseases classified elsewhere with behavioral disturbance: Secondary | ICD-10-CM

## 2020-05-21 DIAGNOSIS — G20 Parkinson's disease: Secondary | ICD-10-CM

## 2020-05-21 LAB — GLUCOSE, CAPILLARY: Glucose-Capillary: 89 mg/dL (ref 70–99)

## 2020-05-21 MED ORDER — SODIUM CHLORIDE 0.9 % IV SOLN: INTRAVENOUS | Status: AC

## 2020-05-21 NOTE — Progress Notes (Signed)
Patient was able to void, incont. Of urine unable to measure. MD notified via text no new order given. Will continue to monitor.

## 2020-05-21 NOTE — Care Management Important Message (Signed)
Important Message  Patient Details  Name: Sheila Parrish MRN: [id number] Date of Birth: [birth year]   Medicare Important Message Given:  Yes     Shelda Altes 05/21/2020, 11:31 AM

## 2020-05-21 NOTE — Progress Notes (Signed)
Pt has not urinated. Pt up to bathroom but had no success.

## 2020-05-21 NOTE — Plan of Care (Signed)
  Problem: Education: Goal: Knowledge of General Education information will improve Description: Including pain rating scale, medication(s)/side effects and non-pharmacologic comfort measures Outcome: Progressing   Problem: Health Behavior/Discharge Planning: Goal: Ability to manage health-related needs will improve Outcome: Progressing   Problem: Activity: Goal: Risk for activity intolerance will decrease Outcome: Progressing   

## 2020-05-21 NOTE — Progress Notes (Signed)
Nutrition Follow-up  DOCUMENTATION CODES:   Not applicable  INTERVENTION:   -Continue MVI with minerals daily -Continue Ensure Enlive po BID, each supplement provides 350 kcal and 20 grams of protein -Continue Magic cup BID with meals, each supplement provides 290 kcal and 9 grams of protein  NUTRITION DIAGNOSIS:   Inadequate oral intake related to decreased appetite as evidenced by meal completion < 50%.  Ongoing  GOAL:   Patient will meet greater than or equal to 90% of their needs  Progressing   MONITOR:   PO intake, Supplement acceptance, Labs, Weight trends, Skin, I & O's  REASON FOR ASSESSMENT:   Malnutrition Screening Tool    ASSESSMENT:   Sheila Parrish is a 86 y.o. female with medical history significant of recurrent syncopal spells, orthostatic hypotension, dementia, CVA, and HTN presented to ED after suffering a syncopal spell. Pt was admitted to hospital two weeks ago for same reason. Pt daughter at bedside who talked to pt's caregiver and who reported this happened while patient was sitting watching TV. Pt seemed to recover consciousness after about 20-30 min estimated by daughter.  Reviewed I/O's: -390 ml x 24 hours and -2.1 L since admission  UOP: 750 ml x 24 hours  Pt sitting up recliner chair at time of visit, minimally interactive with this RD.   Spoke with daughter, who provided history. She shares that pt's appetite has improved since hospitalization and consumed almost all of her breakfast- meal completion documented at 25-85%. PTA, pt was consuming 3 meals per day- she would typically eat a larger breakfast and two smaller meals (such as sandwich) at lunch and dinner. Her appetite dwindles as the day progresses and this is baseline for her.   Pt daughter estimates that pt has lost about 10# over the past 6 months, however, weight generally fluctuates at baseline. Pt has tried Ensure in the past with variable acceptance. Per discussion with RN, pt id  drinking Ensure and taking medications well. Daughter reports pt ate some of the Magic Cup, but was confused by the texture.   Labs reviewed: CBGS: 86-89.   Diet Order:   Diet Order            Diet regular Room service appropriate? Yes; Fluid consistency: Thin  Diet effective now              EDUCATION NEEDS:   No education needs have been identified at this time  Skin:  Skin Assessment: Reviewed RN Assessment  Last BM:  05/17/20  Height:   Ht Readings from Last 1 Encounters:  05/17/20 4\' 11"  (1.499 m)    Weight:   Wt Readings from Last 1 Encounters:  05/21/20 51.8 kg    Ideal Body Weight:  44.5 kg  BMI:  Body mass index is 23.07 kg/m.  Estimated Nutritional Needs:   Kcal:  1500-1700  Protein:  70-85 grams  Fluid:  > 1.5 L    Loistine Chance, RD, LDN, [place] Registered Dietitian II Certified Diabetes Care and Education Specialist Please refer to [facility] for RD and/or RD on-call/weekend/after hours pager

## 2020-05-21 NOTE — Progress Notes (Signed)
PROGRESS NOTE    Sheila Parrish  ZOX:[id number] DOB: 07-27-1927 DOA: 05/17/2020 PCP: Lavone Orn, MD   Brief Narrative: Patient is a 77 old female with history of recurrent syncopal spell, Orthostatic hypotension, dementia, CVA, hypertension who presented to the emergency department with a syncopal episode .  She was admitted here 2 weeks ago for same reason.  Syncopal episode happened while sitting watching TV.  She readily regained her consciousness after about 20 to 30 minutes.  On presentation she was orthostatic with blood pressure of 70/ 50 mmhG. Her blood pressure quickly rebounded and she is hemodynamically stable at present.  FOBT test done in the emergency department also came out a positive.  Cardiology consulted.  She has been started on compression stockings, abdominal binder.    Assessment & Plan:   Active Problems:   Syncope   Recurrent syncope Orthostasis vitals improved today History of chronic orthostatic hypotension.  Most likely secondary to autonomic dysfunction She has a loop recorder and the recorder interrogation did not show any arrythmias. On her last admission, there was plan for TEE but daughter declined due to risk outweighing benefit due to her advanced age.   Cardiology following.  Fludrocortisone was stopped, started on midodrine, signed off, follow as outpt Started on compression hose, abdominal binder Echocardiogram done on May this year showed ejection fraction of 65 to 81%, grade 1 diastolic dysfunction.  Suspicion for GI bleed FOBT was done in the emergency department and found to be  positive.  She is on aspirin and Plavix for recent stroke.  Plavix has been held, aspirin continued.  Hemoglobin has remained stable. No plan for intervention.  Currently she is on PPI twice daily.  No report of hematochezia or melena.  Acute urinary retention/poor urine output Foley was placed earlier which has been removed Continue in and out as needed Noted to have  poor UO, on bladder scan showed about 120cc Started on IVF  Dementia Continue supportive care.  Debility/deconditioning Seen by physical therapy and recommended home with PT on discharge.  Vascular lesion on the right lateral thigh: Looks like hemangioma, ultrasound of the right thigh noted to be non specific, recommend MRI for further eval     Nutrition Problem: Inadequate oral intake Etiology: decreased appetite      DVT prophylaxis:SCD Code Status: DNR Family Communication: Daughter was present at the bedside Status is: Inpatient  Remains inpatient appropriate because:Unsafe d/c plan   Dispo: The patient is from: Home              Anticipated d/c is to: Home              Anticipated d/c date is: 1 day              Patient currently is not medically stable to d/c.    Consultants: Cardiology  Procedures:None  Antimicrobials:  Anti-infectives (From admission, onward)   None      Subjective: Patient seen and examined at pleasantly confused.  Daughter at bedside, all questions answered.  Noted to have poor urine output, started on IV fluids.  Looks comfortable, denies any new complaints.  Objective: Vitals:   05/20/20 1953 05/21/20 0348 05/21/20 0452 05/21/20 1056  BP: 139/64  (!) 145/65   Pulse: 81  81   Resp: 18  20   Temp: 97.9 F (36.6 C)  98.5 F (36.9 C)   TempSrc: Oral     SpO2: 97%  92% 100%  Weight:  51.8 kg  Height:        Intake/Output Summary (Last 24 hours) at 05/21/2020 1900 Last data filed at 05/21/2020 1300 Gross per 24 hour  Intake 480 ml  Output 400 ml  Net 80 ml   Filed Weights   05/17/20 1439 05/18/20 0515 05/21/20 0348  Weight: 57 kg 47.2 kg 51.8 kg    Examination:  General: NAD, frail, facial ecchymosis  Cardiovascular: S1, S2 present  Respiratory: CTAB  Abdomen: Soft, nontender, nondistended, bowel sounds present  Musculoskeletal: No bilateral pedal edema noted, 3X3 appearing mass on lateral R thigh, non  tender  Skin: Normal  Psychiatry: Normal mood    Data Reviewed: I have personally reviewed following labs and imaging studies  CBC: Recent Labs  Lab 05/17/20 1526 05/17/20 2219 05/18/20 0511 05/19/20 0511  WBC 8.5  --  5.6 5.6  NEUTROABS 7.2  --   --  4.1  HGB 10.8* 10.8* 10.9* 10.0*  HCT 34.3* 33.2* 33.7* 30.6*  MCV 94.8  --  92.6 91.6  PLT 408*  --  421* 951   Basic Metabolic Panel: Recent Labs  Lab 05/17/20 1526 05/20/20 1908  NA 138 141  K 3.6 3.5  CL 105 104  CO2 24 24  GLUCOSE 109* 145*  BUN 17 12  CREATININE 0.76 0.85  CALCIUM 8.8* 8.7*   GFR: Estimated Creatinine Clearance: 29.4 mL/min (by C-G formula based on SCr of 0.85 mg/dL). Liver Function Tests: Recent Labs  Lab 05/17/20 1526  AST 16  ALT 10  ALKPHOS 59  BILITOT 0.6  PROT 5.9*  ALBUMIN 3.2*   No results for input(s): LIPASE, AMYLASE in the last 168 hours. No results for input(s): AMMONIA in the last 168 hours. Coagulation Profile: Recent Labs  Lab 05/17/20 1526  INR 1.1   Cardiac Enzymes: No results for input(s): CKTOTAL, CKMB, CKMBINDEX, TROPONINI in the last 168 hours. BNP (last 3 results) No results for input(s): PROBNP in the last 8760 hours. HbA1C: No results for input(s): HGBA1C in the last 72 hours. CBG: Recent Labs  Lab 05/17/20 1529 05/18/20 0511 05/19/20 0653 05/20/20 0503 05/21/20 0612  GLUCAP 86 84 98 86 89   Lipid Profile: No results for input(s): CHOL, HDL, LDLCALC, TRIG, CHOLHDL, LDLDIRECT in the last 72 hours. Thyroid Function Tests: No results for input(s): TSH, T4TOTAL, FREET4, T3FREE, THYROIDAB in the last 72 hours. Anemia Panel: No results for input(s): VITAMINB12, FOLATE, FERRITIN, TIBC, IRON, RETICCTPCT in the last 72 hours. Sepsis Labs: No results for input(s): PROCALCITON, LATICACIDVEN in the last 168 hours.  Recent Results (from the past 240 hour(s))  SARS Coronavirus 2 by RT PCR (hospital order, performed in [facility] hospital lab)  Nasopharyngeal Nasopharyngeal Swab     Status: None   Collection Time: 05/17/20  8:47 PM   Specimen: Nasopharyngeal Swab  Result Value Ref Range Status   SARS Coronavirus 2 NEGATIVE NEGATIVE Final    Comment: (NOTE) SARS-CoV-2 target nucleic acids are NOT DETECTED. The SARS-CoV-2 RNA is generally detectable in upper and lower respiratory specimens during the acute phase of infection. The lowest concentration of SARS-CoV-2 viral copies this assay can detect is 250 copies / mL. A negative result does not preclude SARS-CoV-2 infection and should not be used as the sole basis for treatment or other patient management decisions.  A negative result may occur with improper specimen collection / handling, submission of specimen other than nasopharyngeal swab, presence of viral mutation(s) within the areas targeted by this assay, and inadequate number of viral  copies (<250 copies / mL). A negative result must be combined with clinical observations, patient history, and epidemiological information. Fact Sheet for Patients:   StrictlyIdeas.no Fact Sheet for Healthcare Providers: BankingDealers.co.za This test is not yet approved or cleared  by the [place] FDA and has been authorized for detection and/or diagnosis of SARS-CoV-2 by FDA under an Emergency Use Authorization (EUA).  This EUA will remain in effect (meaning this test can be used) for the duration of the COVID-19 declaration under Section 564(b)(1) of the Act, 21 U.S.C. section 360bbb-3(b)(1), unless the authorization is terminated or revoked sooner. Performed at [facility], [place] [address] [place], [place] [address]          Radiology Studies: [place] RT LOWER EXTREM LTD SOFT TISSUE NON VASCULAR  Result Date: 05/20/2020 CLINICAL DATA:  Palpable mass right lateral upper thigh EXAM: ULTRASOUND RIGHT LOWER EXTREMITY LIMITED TECHNIQUE: Ultrasound examination of the lower  extremity soft tissues was performed in the area of clinical concern. COMPARISON:  None. FINDINGS: Sonographic evaluation of the palpable area in the lateral upper right thigh was performed. There is a 3.5 x 2.2 x 3.3 cm complex cystic and solid mass. Solid component demonstrates marked increased vascularity. The appearance is entirely nonspecific based on ultrasound, and MRI is recommended for further evaluation. IMPRESSION: 1. Complex cystic and solid mass with increased vascularity within the solid components, corresponding to the palpable abnormality. Ultrasound appearance is nonspecific, and MRI with and without IV contrast is recommended for further evaluation. Electronically Signed   By: Randa Ngo M.D.   On: 05/20/2020 18:24        Scheduled Meds:  aspirin  325 mg Oral Daily   feeding supplement (ENSURE ENLIVE)  237 mL Oral BID BM   midodrine  2.5 mg Oral BID   multivitamin with minerals  1 tablet Oral Daily   pantoprazole  40 mg Oral BID   polyethylene glycol  17 g Oral Daily   senna-docusate  1 tablet Oral BID   sodium chloride flush  3 mL Intravenous Q12H   Continuous Infusions:  sodium chloride 100 mL/hr at 05/21/20 1416     LOS: 4 days       Alma Friendly, MD [facility] P6/08/2020, 7:00 PM

## 2020-05-21 NOTE — Progress Notes (Signed)
Bladder scan done per order 120 cc present in the bladder. MD notified see epic for new order.

## 2020-05-21 NOTE — Progress Notes (Signed)
Repeat orthostatic Bps look good. No further recs at this time.  [facility] will sign off.   Medication Recommendations:  Midodrine 2.5mg  BID Other recommendations (labs, testing, etc):  none Follow up as an outpatient:  2-3 weeks with Dr. Radford Pax

## 2020-05-21 NOTE — Progress Notes (Signed)
Progress Note  Patient Name: Sheila Parrish Date of Encounter: 05/21/2020  Primary Cardiologist: Fransico Him, MD   Subjective  Denies any chest pain or SOB.  Orthostatic BP ordered yesterday and not done  Inpatient Medications    Scheduled Meds: . aspirin  325 mg Oral Daily  . feeding supplement (ENSURE ENLIVE)  237 mL Oral BID BM  . midodrine  2.5 mg Oral BID  . multivitamin with minerals  1 tablet Oral Daily  . pantoprazole  40 mg Oral BID  . polyethylene glycol  17 g Oral Daily  . senna-docusate  1 tablet Oral BID  . sodium chloride flush  3 mL Intravenous Q12H   Continuous Infusions:  PRN Meds: acetaminophen, hydrALAZINE   Vital Signs    Vitals:   05/20/20 1305 05/20/20 1953 05/21/20 0348 05/21/20 0452  BP: 123/73 139/64  (!) 145/65  Pulse: 77 81  81  Resp:  18  20  Temp:  97.9 F (36.6 C)  98.5 F (36.9 C)  TempSrc:  Oral    SpO2:  97%  92%  Weight:   51.8 kg   Height:        Intake/Output Summary (Last 24 hours) at 05/21/2020 0900 Last data filed at 05/21/2020 0400 Gross per 24 hour  Intake 360 ml  Output 750 ml  Net -390 ml   Filed Weights   05/17/20 1439 05/18/20 0515 05/21/20 0348  Weight: 57 kg 47.2 kg 51.8 kg    Telemetry    NSR  - Personally Reviewed  ECG    No new EKG to review - Personally Reviewed  Physical Exam   GEN: Well nourished, well developed in no acute distress HEENT: Normal NECK: No JVD; No carotid bruits LYMPHATICS: No lymphadenopathy CARDIAC:RRR, no murmurs, rubs, gallops RESPIRATORY:  Clear to auscultation without rales, wheezing or rhonchi  ABDOMEN: Soft, non-tender, non-distended MUSCULOSKELETAL:  No edema; No deformity  SKIN: Warm and dry NEUROLOGIC:  Alert and oriented x 3 PSYCHIATRIC:  Normal affect    Labs    Chemistry Recent Labs  Lab 05/17/20 1526 05/20/20 1908  NA 138 141  K 3.6 3.5  CL 105 104  CO2 24 24  GLUCOSE 109* 145*  BUN 17 12  CREATININE 0.76 0.85  CALCIUM 8.8* 8.7*  PROT 5.9*  --    ALBUMIN 3.2*  --   AST 16  --   ALT 10  --   ALKPHOS 59  --   BILITOT 0.6  --   GFRNONAA >60 60*  GFRAA >60 >60  ANIONGAP 9 13     Hematology Recent Labs  Lab 05/17/20 1526 05/17/20 1526 05/17/20 2219 05/18/20 0511 05/19/20 0511  WBC 8.5  --   --  5.6 5.6  RBC 3.62*  --   --  3.64* 3.34*  HGB 10.8*   < > 10.8* 10.9* 10.0*  HCT 34.3*   < > 33.2* 33.7* 30.6*  MCV 94.8  --   --  92.6 91.6  MCH 29.8  --   --  29.9 29.9  MCHC 31.5  --   --  32.3 32.7  RDW 13.2  --   --  13.2 13.2  PLT 408*  --   --  421* 383   < > = values in this interval not displayed.    Cardiac EnzymesNo results for input(s): TROPONINI in the last 168 hours. No results for input(s): TROPIPOC in the last 168 hours.   BNPNo results for input(s): BNP,  PROBNP in the last 168 hours.   DDimer No results for input(s): DDIMER in the last 168 hours.   Radiology    [place] RT LOWER EXTREM LTD SOFT TISSUE NON VASCULAR  Result Date: 05/20/2020 CLINICAL DATA:  Palpable mass right lateral upper thigh EXAM: ULTRASOUND RIGHT LOWER EXTREMITY LIMITED TECHNIQUE: Ultrasound examination of the lower extremity soft tissues was performed in the area of clinical concern. COMPARISON:  None. FINDINGS: Sonographic evaluation of the palpable area in the lateral upper right thigh was performed. There is a 3.5 x 2.2 x 3.3 cm complex cystic and solid mass. Solid component demonstrates marked increased vascularity. The appearance is entirely nonspecific based on ultrasound, and MRI is recommended for further evaluation. IMPRESSION: 1. Complex cystic and solid mass with increased vascularity within the solid components, corresponding to the palpable abnormality. Ultrasound appearance is nonspecific, and MRI with and without IV contrast is recommended for further evaluation. Electronically Signed   By: Randa Ngo M.D.   On: 05/20/2020 18:24    Cardiac Studies   2D echo 05/03/2020 IMPRESSIONS   1. Left ventricular ejection fraction, by  estimation, is 65 to 70%. The  left ventricle has normal function. The left ventricle has no regional  wall motion abnormalities. Left ventricular diastolic parameters are  consistent with Grade I diastolic  dysfunction (impaired relaxation).  2. Right ventricular systolic function is normal. The right ventricular  size is normal. There is mildly elevated pulmonary artery systolic  pressure. The estimated right ventricular systolic pressure is 88.4 mmHg.  3. Left atrial size was mildly dilated.  4. The mitral valve is normal in structure. Mild mitral valve  regurgitation. No evidence of mitral stenosis.  5. The aortic valve is normal in structure. Aortic valve regurgitation is  mild. Mild to moderate aortic valve sclerosis/calcification is present,  without any evidence of aortic stenosis.  6. The inferior vena cava is dilated in size with <50% respiratory  variability, suggesting right atrial pressure of 15 mmHg.   Patient Profile     86 y.o. female with a hx of cryptogenic stroke, syncope, dementia  who is being seen  for the evaluation of syncope at the request of Dr. Tawanna Solo   Assessment & Plan    1.  Syncope -orthostatics markedly abnormal on admit with SBP dropping from 146>>92 upon standing -her syncopal episode, though, are while she was sitting -IRL interrogated and no arrhythmias  -added compression hose and abdominal binder  -2D echo normal -continue Midodrine 2.5mg  TID -follow for supine HTN while titrating midodrine -will repeat orthostatics with abdominal binder, compression hose and midodrine today (has been ordered 2 days in a row and not done) -if remains orthostatic may need to consider mestinon as BP is elevated and would like to avoid increasing midodrine further  2.  +FOBT -GI following -Plavix for prior CVA currently on hold       For questions or updates, please contact [place] Please consult [email or web address] for contact info under  Cardiology/STEMI.      Signed, Fransico Him, MD  05/21/2020, 9:00 AM

## 2020-05-22 DIAGNOSIS — F0390 Unspecified dementia without behavioral disturbance: Secondary | ICD-10-CM

## 2020-05-22 LAB — GLUCOSE, CAPILLARY: Glucose-Capillary: 97 mg/dL (ref 70–99)

## 2020-05-22 MED ORDER — POLYETHYLENE GLYCOL 3350 17 G PO PACK: 17.0000 g | PACK | Freq: Every day | ORAL | 0 refills | Status: DC

## 2020-05-22 MED ORDER — PANTOPRAZOLE SODIUM 40 MG PO TBEC: 40.0000 mg | DELAYED_RELEASE_TABLET | Freq: Every day | ORAL | 0 refills | Status: DC

## 2020-05-22 MED ORDER — MIDODRINE HCL 2.5 MG PO TABS: 2.5000 mg | ORAL_TABLET | Freq: Two times a day (BID) | ORAL | 0 refills | Status: DC

## 2020-05-22 NOTE — Progress Notes (Signed)
Patient able to urinate throughout the night.  Bladder scan this morning showed 344ml.  In and out cath produced 577ml from bladder.

## 2020-05-22 NOTE — Progress Notes (Signed)
Patient incont. Of small  urine bladder scan done 167 cc in the bladder. MD notified via text page. Will continue to monitor the patient.

## 2020-05-22 NOTE — Progress Notes (Signed)
While cleaning patient bottom small red scrach noted, patient daughter notified. Patient d/c home by [facility] .Daughter notified.

## 2020-05-22 NOTE — Progress Notes (Signed)
Patient alert oriented x1 at baseline, VSS, d/c instruction explain and given to the family, all questions answered, tele and iv d/c. Patient awaiting transportation back to [facility] will continue to monitor.

## 2020-05-22 NOTE — Discharge Summary (Signed)
Discharge Summary  Sheila Parrish GYI:[id number] DOB: 01/03/1927  PCP: Lavone Orn, MD  Admit date: 05/17/2020 Discharge date: 05/22/2020  Time spent: 45 mins  Recommendations for Outpatient Follow-up:  1. PCP in 1 week 2. Cardiology as scheduled 3. Urology as needed     Discharge Diagnoses:  Active Hospital Problems   Diagnosis Date Noted  . Syncope 05/17/2020    Resolved Hospital Problems  No resolved problems to display.    Discharge Condition: Stable  Diet recommendation: As tolerated  Vitals:   05/22/20 0506 05/22/20 1314  BP: (!) 148/78 131/61  Pulse:  93  Resp:  15  Temp:  98.4 F (36.9 C)  SpO2:  93%    History of present illness:  Patient is a 86 old female with history of recurrent syncopal spell, Orthostatic hypotension, dementia, CVA, hypertension who presented to the emergency department with a syncopal episode .  She was admitted here 2 weeks ago for same reason.  Syncopal episode happened while sitting watching TV.  She readily regained her consciousness after about 20 to 30 minutes.  On presentation she was orthostatic with blood pressure of 70/ 50 mmhG. Her blood pressure quickly rebounded and she is hemodynamically stable at present.  FOBT test done in the emergency department also came out a positive.  Cardiology consulted.  She has been started on compression stockings, abdominal binder.    Today, pt orthostatic vitals improved, although patient noted symptoms significantly weak during PT evaluation. Pt had an episode of urinary retention this am, in and out cath yielded about 500cc of urine, since then, pt has been voiding intermittently, with no retention noted on bladder scan this afternoon. Discussed extensively about overall care with daughter, pt has a personal 24H caregiver as well as extra prn support from staff at [facility] to care for patient while at her ILF at [facility]. Arranged for HHPT for patient. Follow up with PCP, cardiology  as scheduled and Urology prn.    Hospital Course:  Active Problems:   Syncope  Recurrent syncope Orthostasis vitals improved History of chronic orthostatic hypotension.  Most likely secondary to autonomic dysfunction She has a loop recorder and the recorder interrogation did not show any arrythmias. On her last admission, there was plan for TEE but daughter declined due to risk outweighing benefit due to her advanced age.   Cardiology consulted, fludrocortisone was stopped, started on midodrine, follow as outpt, d/c on midodrine Continue compression hose, abdominal binder Echocardiogram done on May this year showed ejection fraction of 65 to 35%, grade 1 diastolic dysfunction PCP/cardiology follow up  Suspicion for GI bleed FOBT was done in the emergency department and found to be  positive.  She is on aspirin and Plavix for recent stroke.  Plavix was held, aspirin continued.  Hemoglobin has remained stable. No plan for intervention. No report of hematochezia or melena. Restart plavix Continue daily PPI  Acute urinary retention/poor urine output Foley was placed earlier which has been removed, discussed with Dr Lovena Neighbours from Urology who recommended placing another foley due to intermittent retention, daughter refused for now. Gave her info for urology as an outpt. Flomax will not be ideal with a patient with significant orthostatic hypotension UO has significantly improved Outpt urology prn   Dementia Continue supportive care Suspect gradual decline in overall health PCP to consider outpt palliative consult   Debility/deconditioning Seen by physical therapy and recommended home with PT on discharge  Chronic vascular lesion on the right lateral thigh:  Looks like hemangioma, patient has been seen by numerous specialist including dermatology surgeons etc without any diagnosis/management. Ultrasound of the right thigh noted to be non specific, recommend MRI for further eval (daughter  refused MRI for further eval)        Malnutrition Type:  Nutrition Problem: Inadequate oral intake Etiology: decreased appetite   Malnutrition Characteristics:  Signs/Symptoms: meal completion < 50%   Nutrition Interventions:  Interventions: Ensure Enlive (each supplement provides 350kcal and 20 grams of protein), Magic cup, MVI   Estimated body mass index is 22.62 kg/m as calculated from the following:   Height as of this encounter: 4\' 11"  (1.499 m).   Weight as of this encounter: 50.8 kg.    Procedures: NOne  Consultations:  Cardiology  Spoke to Urology Dr Lovena Neighbours on 05/22/20  Discharge Exam: BP 131/61 (BP Location: Right Arm)   Pulse 93   Temp 98.4 F (36.9 C) (Oral)   Resp 15   Ht 4\' 11"  (1.499 m)   Wt 50.8 kg   SpO2 93%   BMI 22.62 kg/m   General: NAD, frail, deconditioned, facial ecchymosis Cardiovascular: S1, S2 present Respiratory: CTAB   Discharge Instructions You were cared for by a hospitalist during your hospital stay. If you have any questions about your discharge medications or the care you received while you were in the hospital after you are discharged, you can call the unit and asked to speak with the hospitalist on call if the hospitalist that took care of you is not available. Once you are discharged, your primary care physician will handle any further medical issues. Please note that NO REFILLS for any discharge medications will be authorized once you are discharged, as it is imperative that you return to your primary care physician (or establish a relationship with a primary care physician if you do not have one) for your aftercare needs so that they can reassess your need for medications and monitor your lab values.  Discharge Instructions    Diet - low sodium heart healthy   Complete by: As directed    Increase activity slowly   Complete by: As directed      Allergies as of 05/22/2020      Reactions   Donepezil Nausea And Vomiting     Alendronate Other (See Comments)   Difficulty swallowing   Atorvastatin Other (See Comments)   Leg cramping   Bactrim [sulfamethoxazole-trimethoprim] Nausea And Vomiting   Macrodantin [nitrofurantoin Macrocrystal] Other (See Comments)   Unknown reaction   Simvastatin Other (See Comments)   Leg cramping   Sulfa Antibiotics Nausea And Vomiting   Azithromycin Rash      Medication List    STOP taking these medications   fludrocortisone 0.1 MG tablet Commonly known as: FLORINEF   traMADol 50 MG tablet Commonly known as: ULTRAM     TAKE these medications   acetaminophen 500 MG tablet Commonly known as: TYLENOL Take 500 mg by mouth every 6 (six) hours as needed for headache (pain). Notes to patient: For headaches. Do not take more than 3000mg  per day   aspirin 325 MG tablet Take 1 tablet (325 mg total) by mouth daily. Notes to patient: To prevent clot and stroke   clopidogrel 75 MG tablet Commonly known as: PLAVIX Take 1 tablet (75 mg total) by mouth daily. Notes to patient: To prevent clot and stroke   midodrine 2.5 MG tablet Commonly known as: PROAMATINE Take 1 tablet (2.5 mg total) by mouth 2 (two) times daily.  Notes to patient: **NEW** To increase blood pressure   MINERAL ICE EX Apply 1 application topically at bedtime.   pantoprazole 40 MG tablet Commonly known as: PROTONIX Take 1 tablet (40 mg total) by mouth daily. Notes to patient: **NEW** To lower stomach acid   polyethylene glycol 17 g packet Commonly known as: MIRALAX / GLYCOLAX Take 17 g by mouth daily. Start taking on: May 23, 2020 Notes to patient: **NEW** To prevent constipation      Allergies  Allergen Reactions  . Donepezil Nausea And Vomiting  . Alendronate Other (See Comments)    Difficulty swallowing  . Atorvastatin Other (See Comments)    Leg cramping  . Bactrim [Sulfamethoxazole-Trimethoprim] Nausea And Vomiting  . Macrodantin [Nitrofurantoin Macrocrystal] Other (See Comments)     Unknown reaction  . Simvastatin Other (See Comments)    Leg cramping  . Sulfa Antibiotics Nausea And Vomiting  . Azithromycin Rash    Follow-up Information    Lavone Orn, MD. Go on 05/29/2020.   Specialty: Internal Medicine Why: @1 :15pm Contact information: [address] [place] [address] [phone]        Sueanne Margarita, MD .   Specialty: Cardiology Contact information: [phone] [address] [place] [address] [phone]        Ceasar Mons, MD Follow up.   Specialty: Urology Why: Call as needed if mom is not urinating adequately  Contact information: [address] [place] [address] [phone]                The results of significant diagnostics from this hospitalization (including imaging, microbiology, ancillary and laboratory) are listed below for reference.    Significant Diagnostic Studies: DG Chest 1 View  Result Date: 05/17/2020 CLINICAL DATA:  Syncope. EXAM: CHEST  1 VIEW COMPARISON:  Apr 13, 2020 FINDINGS: There is no evidence of acute infiltrate, pleural effusion or pneumothorax. The heart size and mediastinal contours are within normal limits. The visualized skeletal structures are unremarkable. IMPRESSION: No active disease. Electronically Signed   By: Virgina Norfolk M.D.   On: 05/17/2020 15:57   DG Pelvis 1-2 Views  Result Date: 05/02/2020 CLINICAL DATA:  Pain following fall EXAM: PELVIS - 1-2 VIEW COMPARISON:  None. FINDINGS: There is no evidence of pelvic fracture or dislocation. There is moderate symmetric narrowing of each hip joint. No erosive change. Bones appear somewhat osteoporotic. There is degenerative change in the lower lumbar spine with lower lumbar levoscoliosis. IMPRESSION: Symmetric narrowing of each hip joint. Scoliosis and degenerative change in lower lumbar spine. Evidence of a degree of osteoporosis. No fracture or dislocation. Electronically Signed   By: Lowella Grip III M.D.   On: 05/02/2020 13:16   CT HEAD WO CONTRAST  Result Date: 05/02/2020 CLINICAL DATA:  [place] out of a chair with trauma to the head and neck. EXAM: CT HEAD WITHOUT CONTRAST CT CERVICAL SPINE WITHOUT CONTRAST TECHNIQUE: Multidetector CT imaging of the head and cervical spine was performed following the standard protocol without intravenous contrast. Multiplanar CT image reconstructions of the cervical spine were also generated. COMPARISON:  04/13/2020 FINDINGS: CT HEAD FINDINGS Brain: No focal finding affects the brainstem or cerebellum. Cerebral hemispheres show age related atrophy. Old lacunar infarction in the left internal capsule. Old infarction in the medial right occipital lobe. Confluent chronic small vessel disease elsewhere affecting the cerebral hemispheric white matter. No sign of acute infarction, mass lesion, hemorrhage, hydrocephalus or extra-axial collection. Vascular: There  is atherosclerotic calcification of the major vessels at the base of the brain. Skull: Negative Sinuses/Orbits: No significant sinus disease.  Orbits negative. Other: None CT CERVICAL SPINE FINDINGS Alignment: Straightening of the normal cervical lordosis. 2 mm degenerative anterolisthesis C3-4. Skull base and vertebrae: No fracture. Likely benign lucency posteriorly within the C2 vertebral body. Osteopenia in general. Soft tissues and spinal canal: No significant soft tissue finding. Disc levels: No significant abnormality at the foramen magnum or C1-2. C2-3: Facet osteoarthritis on the right.  No stenosis. C3-4: Facet osteoarthritis on the right with 2 mm of anterolisthesis. Bony foraminal narrowing on the right. C4-5: Mild disc space narrowing. Facet osteoarthritis on the right. Bony foraminal stenosis on the right. C5-6: Bilateral uncovertebral osteophytes with mild bony foraminal narrowing. C6-7: Minimal uncovertebral osteophytes. Minimal bilateral foraminal narrowing. C7-T1: Negative interspace. Upper  chest: Negative Other: None IMPRESSION: Head CT: No acute or traumatic finding. Old ischemic changes as seen previously and outlined above. Cervical spine CT: No acute or traumatic finding. Chronic degenerative spondylosis and facet arthropathy as seen previously and outlined above. Electronically Signed   By: Nelson Chimes M.D.   On: 05/02/2020 16:20   CT CERVICAL SPINE WO CONTRAST  Result Date: 05/02/2020 CLINICAL DATA:  [place] out of a chair with trauma to the head and neck. EXAM: CT HEAD WITHOUT CONTRAST CT CERVICAL SPINE WITHOUT CONTRAST TECHNIQUE: Multidetector CT imaging of the head and cervical spine was performed following the standard protocol without intravenous contrast. Multiplanar CT image reconstructions of the cervical spine were also generated. COMPARISON:  04/13/2020 FINDINGS: CT HEAD FINDINGS Brain: No focal finding affects the brainstem or cerebellum. Cerebral hemispheres show age related atrophy. Old lacunar infarction in the left internal capsule. Old infarction in the medial right occipital lobe. Confluent chronic small vessel disease elsewhere affecting the cerebral hemispheric white matter. No sign of acute infarction, mass lesion, hemorrhage, hydrocephalus or extra-axial collection. Vascular: There is atherosclerotic calcification of the major vessels at the base of the brain. Skull: Negative Sinuses/Orbits: No significant sinus disease.  Orbits negative. Other: None CT CERVICAL SPINE FINDINGS Alignment: Straightening of the normal cervical lordosis. 2 mm degenerative anterolisthesis C3-4. Skull base and vertebrae: No fracture. Likely benign lucency posteriorly within the C2 vertebral body. Osteopenia in general. Soft tissues and spinal canal: No significant soft tissue finding. Disc levels: No significant abnormality at the foramen magnum or C1-2. C2-3: Facet osteoarthritis on the right.  No stenosis. C3-4: Facet osteoarthritis on the right with 2 mm of anterolisthesis. Bony foraminal  narrowing on the right. C4-5: Mild disc space narrowing. Facet osteoarthritis on the right. Bony foraminal stenosis on the right. C5-6: Bilateral uncovertebral osteophytes with mild bony foraminal narrowing. C6-7: Minimal uncovertebral osteophytes. Minimal bilateral foraminal narrowing. C7-T1: Negative interspace. Upper chest: Negative Other: None IMPRESSION: Head CT: No acute or traumatic finding. Old ischemic changes as seen previously and outlined above. Cervical spine CT: No acute or traumatic finding. Chronic degenerative spondylosis and facet arthropathy as seen previously and outlined above. Electronically Signed   By: Nelson Chimes M.D.   On: 05/02/2020 16:20   DG Knee Complete 4 Views Left  Result Date: 05/02/2020 CLINICAL DATA:  Pain after fall EXAM: LEFT KNEE - COMPLETE 4+ VIEW COMPARISON:  None. FINDINGS: Frontal, lateral, and bilateral oblique views were obtained. Patient is status post total knee replacement with femoral and tibial prosthetic components well-seated. No fracture or dislocation. No appreciable joint effusion. No erosive change. There is calcification inferior to the patella. IMPRESSION: Status post  total knee replacement with prosthetic components well-seated. No fracture or dislocation. Suspect a degree of patellar tendinosis. Electronically Signed   By: Lowella Grip III M.D.   On: 05/02/2020 13:15   ECHOCARDIOGRAM COMPLETE  Result Date: 05/03/2020    ECHOCARDIOGRAM REPORT   Patient Name:   JESLYN AMSLER Date of Exam: 05/03/2020 Medical Rec #:  [id number]      Height:       59.0 in Accession #:    [id number]     Weight:       118.2 lb Date of Birth:  02-05-1927       BSA:          1.475 m Patient Age:    53 years       BP:           165/65 mmHg Patient Gender: F              HR:           85 bpm. Exam Location:  Inpatient Procedure: 2D Echo Indications:    Syncope 780.2 / R55  History:        Patient has prior history of Echocardiogram examinations, most                 recent  02/29/2020. Stroke; Arrythmias:ventricular bigeminy.                 Orthostatic hypotension, dementia.  Sonographer:    Darlina Sicilian RDCS Referring Phys: [phone] [facility]  Sonographer Comments: Technically challenging study due to limited acoustic windows and suboptimal parasternal window. IMPRESSIONS  1. Left ventricular ejection fraction, by estimation, is 65 to 70%. The left ventricle has normal function. The left ventricle has no regional wall motion abnormalities. Left ventricular diastolic parameters are consistent with Grade I diastolic dysfunction (impaired relaxation).  2. Right ventricular systolic function is normal. The right ventricular size is normal. There is mildly elevated pulmonary artery systolic pressure. The estimated right ventricular systolic pressure is 54.2 mmHg.  3. Left atrial size was mildly dilated.  4. The mitral valve is normal in structure. Mild mitral valve regurgitation. No evidence of mitral stenosis.  5. The aortic valve is normal in structure. Aortic valve regurgitation is mild. Mild to moderate aortic valve sclerosis/calcification is present, without any evidence of aortic stenosis.  6. The inferior vena cava is dilated in size with <50% respiratory variability, suggesting right atrial pressure of 15 mmHg. Comparison(s): No significant change from prior study. FINDINGS  Left Ventricle: Left ventricular ejection fraction, by estimation, is 65 to 70%. The left ventricle has normal function. The left ventricle has no regional wall motion abnormalities. The left ventricular internal cavity size was normal in size. There is  no left ventricular hypertrophy. Left ventricular diastolic parameters are consistent with Grade I diastolic dysfunction (impaired relaxation). Normal left ventricular filling pressure. Right Ventricle: The right ventricular size is normal. No increase in right ventricular wall thickness. Right ventricular systolic function is normal. There is mildly  elevated pulmonary artery systolic pressure. The tricuspid regurgitant velocity is 2.39  m/s, and with an assumed right atrial pressure of 15 mmHg, the estimated right ventricular systolic pressure is 70.6 mmHg. Left Atrium: Left atrial size was mildly dilated. Right Atrium: Right atrial size was normal in size. Pericardium: There is no evidence of pericardial effusion. Mitral Valve: The mitral valve is normal in structure. Normal mobility of the mitral valve leaflets. Mild mitral valve regurgitation. No evidence of mitral  valve stenosis. Tricuspid Valve: The tricuspid valve is normal in structure. Tricuspid valve regurgitation is mild . No evidence of tricuspid stenosis. Aortic Valve: The aortic valve is normal in structure. Aortic valve regurgitation is mild. Aortic regurgitation PHT measures 479 msec. Mild to moderate aortic valve sclerosis/calcification is present, without any evidence of aortic stenosis. Pulmonic Valve: The pulmonic valve was normal in structure. Pulmonic valve regurgitation is not visualized. No evidence of pulmonic stenosis. Aorta: The aortic root is normal in size and structure. Venous: The inferior vena cava is dilated in size with less than 50% respiratory variability, suggesting right atrial pressure of 15 mmHg. IAS/Shunts: No atrial level shunt detected by color flow Doppler.  LEFT VENTRICLE PLAX 2D LVIDd:         3.20 cm  Diastology LVIDs:         1.40 cm  LV e' lateral:   7.40 cm/s LV PW:         0.90 cm  LV E/e' lateral: 8.7 LV IVS:        1.10 cm  LV e' medial:    5.55 cm/s LVOT diam:     1.40 cm  LV E/e' medial:  11.7 LV SV:         22 LV SV Index:   15 LVOT Area:     1.54 cm  LEFT ATRIUM             Index LA diam:        2.80 cm 1.90 cm/m LA Vol (A2C):   22.9 ml 15.53 ml/m LA Vol (A4C):   30.9 ml 20.95 ml/m LA Biplane Vol: 27.4 ml 18.58 ml/m  AORTIC VALVE LVOT Vmax:   75.20 cm/s LVOT Vmean:  54.400 cm/s LVOT VTI:    0.140 m AI PHT:      479 msec  AORTA Ao Root diam: 2.90 cm Ao  Asc diam:  3.10 cm MITRAL VALVE               TRICUSPID VALVE MV Area (PHT): 4.21 cm    TR Peak grad:   22.8 mmHg MV Decel Time: 180 msec    TR Vmax:        239.00 cm/s MV E velocity: 64.70 cm/s MV A velocity: 75.00 cm/s  SHUNTS MV E/A ratio:  0.86        Systemic VTI:  0.14 m                            Systemic Diam: 1.40 cm Ena Dawley MD Electronically signed by Ena Dawley MD Signature Date/Time: 05/03/2020/12:00:48 PM    Final    [place] RT LOWER EXTREM LTD SOFT TISSUE NON VASCULAR  Result Date: 05/20/2020 CLINICAL DATA:  Palpable mass right lateral upper thigh EXAM: ULTRASOUND RIGHT LOWER EXTREMITY LIMITED TECHNIQUE: Ultrasound examination of the lower extremity soft tissues was performed in the area of clinical concern. COMPARISON:  None. FINDINGS: Sonographic evaluation of the palpable area in the lateral upper right thigh was performed. There is a 3.5 x 2.2 x 3.3 cm complex cystic and solid mass. Solid component demonstrates marked increased vascularity. The appearance is entirely nonspecific based on ultrasound, and MRI is recommended for further evaluation. IMPRESSION: 1. Complex cystic and solid mass with increased vascularity within the solid components, corresponding to the palpable abnormality. Ultrasound appearance is nonspecific, and MRI with and without IV contrast is recommended for further evaluation. Electronically Signed   By: Legrand Como  Owens Shark M.D.   On: 05/20/2020 18:24   CUP PACEART REMOTE DEVICE CHECK  Result Date: 05/05/2020 [facility] summary report received. Battery status OK. Normal device function. No new symptom episodes, tachy episodes, brady, or pause episodes. No new AF episodes. Monthly summary reports and ROV/PRN. [place]   Microbiology: Recent Results (from the past 240 hour(s))  SARS Coronavirus 2 by RT PCR (hospital order, performed in [facility] hospital lab) Nasopharyngeal Nasopharyngeal Swab     Status: None   Collection Time: 05/17/20  8:47 PM   Specimen:  Nasopharyngeal Swab  Result Value Ref Range Status   SARS Coronavirus 2 NEGATIVE NEGATIVE Final    Comment: (NOTE) SARS-CoV-2 target nucleic acids are NOT DETECTED. The SARS-CoV-2 RNA is generally detectable in upper and lower respiratory specimens during the acute phase of infection. The lowest concentration of SARS-CoV-2 viral copies this assay can detect is 250 copies / mL. A negative result does not preclude SARS-CoV-2 infection and should not be used as the sole basis for treatment or other patient management decisions.  A negative result may occur with improper specimen collection / handling, submission of specimen other than nasopharyngeal swab, presence of viral mutation(s) within the areas targeted by this assay, and inadequate number of viral copies (<250 copies / mL). A negative result must be combined with clinical observations, patient history, and epidemiological information. Fact Sheet for Patients:   StrictlyIdeas.no Fact Sheet for Healthcare Providers: BankingDealers.co.za This test is not yet approved or cleared  by the [place] FDA and has been authorized for detection and/or diagnosis of SARS-CoV-2 by FDA under an Emergency Use Authorization (EUA).  This EUA will remain in effect (meaning this test can be used) for the duration of the COVID-19 declaration under Section 564(b)(1) of the Act, 21 U.S.C. section 360bbb-3(b)(1), unless the authorization is terminated or revoked sooner. Performed at [facility], [place] [address] [place], [place] [address]      Labs: Basic Metabolic Panel: Recent Labs  Lab 05/17/20 1526 05/20/20 1908  NA 138 141  K 3.6 3.5  CL 105 104  CO2 24 24  GLUCOSE 109* 145*  BUN 17 12  CREATININE 0.76 0.85  CALCIUM 8.8* 8.7*   Liver Function Tests: Recent Labs  Lab 05/17/20 1526  AST 16  ALT 10  ALKPHOS 59  BILITOT 0.6  PROT 5.9*  ALBUMIN 3.2*   No results for  input(s): LIPASE, AMYLASE in the last 168 hours. No results for input(s): AMMONIA in the last 168 hours. CBC: Recent Labs  Lab 05/17/20 1526 05/17/20 2219 05/18/20 0511 05/19/20 0511  WBC 8.5  --  5.6 5.6  NEUTROABS 7.2  --   --  4.1  HGB 10.8* 10.8* 10.9* 10.0*  HCT 34.3* 33.2* 33.7* 30.6*  MCV 94.8  --  92.6 91.6  PLT 408*  --  421* 383   Cardiac Enzymes: No results for input(s): CKTOTAL, CKMB, CKMBINDEX, TROPONINI in the last 168 hours. BNP: BNP (last 3 results) No results for input(s): BNP in the last 8760 hours.  ProBNP (last 3 results) No results for input(s): PROBNP in the last 8760 hours.  CBG: Recent Labs  Lab 05/18/20 0511 05/19/20 0653 05/20/20 0503 05/21/20 0612 05/22/20 0512  GLUCAP 84 98 86 89 97       Signed:  Alma Friendly, MD [facility] 05/22/2020, 3:10 PM

## 2020-05-22 NOTE — Progress Notes (Signed)
Physical Therapy Treatment Patient Details Name: Sheila Parrish MRN: [id number] DOB: 19-Jul-1927 Today's Date: 05/22/2020    History of Present Illness Sheila Parrish is a 86 y.o. female with medical history significant of recurrent syncopal spells, orthostatic hypotension, dementia, CVA, ventricular bigeminy, and HTN admitted after suffering a syncopal spell.   Recent hospitalization last month for syncope with injury striking her face on the ground.    PT Comments    Pt up in chair with daughter feeding her on entry. Daughter reports pt is not doing as well today. Pt with decreased response to therapist today, and decreased motor planning/sequencing today. Pt modAx2 for sit>stand from recliner with increased posterior lean. Pt incontinent of stool in standing, requires maxAx2 for pivot to [facility]. After cleaned up attempted to ambulate with pt utilizing RW, requires maxAx2 as pt pushes RW far forward and need assist to move LE forward. Pt with increase ambulation and modA when RW removed and 2 person HHA. Pt becomes overwhelmed once in hallway and requires recliner be brought to take her back to the room. Reassess d/c plans in next session. PT will continue to follow acutely.   Orthostatic BPs  Sitting 138/70  Standing 136/68  Sitting after ambulation  118/69  Sitting 3 min after ambulation 116/59      Follow Up Recommendations  Home health PT (return to [facility] with aide assist)     Equipment Recommendations  None recommended by PT       Precautions / Restrictions Precautions Precautions: Fall Precaution Comments: check BP Restrictions Weight Bearing Restrictions: No    Mobility  Bed Mobility               General bed mobility comments: OOB in recliner  Transfers Overall transfer level: Needs assistance Equipment used: Rolling walker (2 wheeled) Transfers: Sit to/from Stand Sit to Stand: +2 physical assistance;Mod assist Stand pivot transfers: Max assist;+2  physical assistance       General transfer comment: modAx2 for power up to standing, maximal multimodal cuing, pt with significant posterior lean to brace self against recliner, pt with incontinence of stool in standing, maxAx2 pivot to [facility] for clean up.   Ambulation/Gait Ambulation/Gait assistance: Max assist;+2 physical assistance;Mod assist Gait Distance (Feet): 12 Feet Assistive device: Rolling walker (2 wheeled);2 person hand held assist Gait Pattern/deviations: Step-to pattern;Step-through pattern;Decreased stride length;Shuffle Gait velocity: slowed Gait velocity interpretation: <1.31 ft/sec, indicative of household ambulator General Gait Details: maxAx2 and multimodal cuing for sequencing with RW, poor motor planning, transitioned to 2 person hand hold assist and modAx2 with better success, overwhelmed with entering hallway, recliner pulled behind her and pt sat for return to room          Balance Overall balance assessment: Needs assistance Sitting-balance support: Feet unsupported Sitting balance-Leahy Scale: Fair Sitting balance - Comments: at EOB S while taking BP   Standing balance support: Bilateral upper extremity supported Standing balance-Leahy Scale: Poor Standing balance comment: UE support for balance                            Cognition Arousal/Alertness: Awake/alert Behavior During Therapy: Flat affect Overall Cognitive Status: History of cognitive impairments - at baseline                                 General Comments: Decreased communication today      Exercises  General Comments General comments (skin integrity, edema, etc.): Daughter present during session, reports pt is not doing as well today. Reports TED hose and binder where soiled yesterday and have not been replaced.       Pertinent Vitals/Pain Pain Assessment: Faces Faces Pain Scale: Hurts a little bit Pain Location: grimace with mobility Pain  Descriptors / Indicators: Discomfort;Grimacing Pain Intervention(s): Limited activity within patient's tolerance;Monitored during session;Repositioned           PT Goals (current goals can now be found in the care plan section) Acute Rehab PT Goals Patient Stated Goal: To reduce falls risk PT Goal Formulation: With family Time For Goal Achievement: 06/01/20 Potential to Achieve Goals: Fair Progress towards PT goals: Not progressing toward goals - comment (decreased motor planning)    Frequency    Min 3X/week      PT Plan Current plan remains appropriate       AM-PAC PT "6 Clicks" Mobility   Outcome Measure  Help needed turning from your back to your side while in a flat bed without using bedrails?: A Lot Help needed moving from lying on your back to sitting on the side of a flat bed without using bedrails?: Total Help needed moving to and from a bed to a chair (including a wheelchair)?: A Lot Help needed standing up from a chair using your arms (e.g., wheelchair or bedside chair)?: A Lot Help needed to walk in hospital room?: A Lot Help needed climbing 3-5 steps with a railing? : Total 6 Click Score: 10    End of Session Equipment Utilized During Treatment: Gait belt Activity Tolerance: Patient tolerated treatment well Patient left: with call bell/phone within reach;with family/visitor present;in chair;with chair alarm set Nurse Communication: Mobility status;Other (comment) (orthostatics) PT Visit Diagnosis: Other abnormalities of gait and mobility (R26.89);Repeated falls (R29.6)     Time: 8638-1771 PT Time Calculation (min) (ACUTE ONLY): 31 min  Charges:  $Gait Training: 8-22 mins $Therapeutic Activity: 8-22 mins                     Jacobie Stamey B. Migdalia Dk PT, DPT Acute Rehabilitation Services Pager [phone] Office [phone]    [place] 05/22/2020, 2:04 PM

## 2020-05-28 DIAGNOSIS — Z1159 Encounter for screening for other viral diseases: Secondary | ICD-10-CM | POA: Diagnosis not present

## 2020-05-28 DIAGNOSIS — Z20828 Contact with and (suspected) exposure to other viral communicable diseases: Secondary | ICD-10-CM | POA: Diagnosis not present

## 2020-05-29 DIAGNOSIS — I951 Orthostatic hypotension: Secondary | ICD-10-CM | POA: Diagnosis not present

## 2020-05-29 DIAGNOSIS — F0390 Unspecified dementia without behavioral disturbance: Secondary | ICD-10-CM | POA: Diagnosis not present

## 2020-05-29 DIAGNOSIS — R2241 Localized swelling, mass and lump, right lower limb: Secondary | ICD-10-CM | POA: Diagnosis not present

## 2020-05-29 DIAGNOSIS — M79605 Pain in left leg: Secondary | ICD-10-CM | POA: Diagnosis not present

## 2020-06-04 ENCOUNTER — Telehealth: Payer: Self-pay | Admitting: Internal Medicine

## 2020-06-04 DIAGNOSIS — Z20828 Contact with and (suspected) exposure to other viral communicable diseases: Secondary | ICD-10-CM | POA: Diagnosis not present

## 2020-06-04 DIAGNOSIS — R2681 Unsteadiness on feet: Secondary | ICD-10-CM | POA: Diagnosis not present

## 2020-06-04 DIAGNOSIS — M2550 Pain in unspecified joint: Secondary | ICD-10-CM | POA: Diagnosis not present

## 2020-06-04 DIAGNOSIS — M6281 Muscle weakness (generalized): Secondary | ICD-10-CM | POA: Diagnosis not present

## 2020-06-04 DIAGNOSIS — Z1159 Encounter for screening for other viral diseases: Secondary | ICD-10-CM | POA: Diagnosis not present

## 2020-06-04 NOTE — Telephone Encounter (Signed)
Called patient's home and spoke with caregiver and she requested that I call the daughter, Sheila Parrish, to schedule the Palliative Consult.  Called and spoke with daughter regarding Palliative services and all questions were answered and she was in agreement with this.  I have scheduled an In-person Consult for 06/18/20 @

## 2020-06-06 ENCOUNTER — Other Ambulatory Visit: Payer: Self-pay

## 2020-06-06 ENCOUNTER — Telehealth (INDEPENDENT_AMBULATORY_CARE_PROVIDER_SITE_OTHER): Payer: Medicare PPO | Admitting: Cardiology

## 2020-06-06 ENCOUNTER — Encounter: Payer: Self-pay | Admitting: Cardiology

## 2020-06-06 ENCOUNTER — Telehealth: Payer: Self-pay | Admitting: Cardiology

## 2020-06-06 VITALS — BP 104/73 | HR 96 | Ht 59.0 in | Wt 114.0 lb

## 2020-06-06 DIAGNOSIS — I951 Orthostatic hypotension: Secondary | ICD-10-CM

## 2020-06-06 DIAGNOSIS — R55 Syncope and collapse: Secondary | ICD-10-CM

## 2020-06-06 DIAGNOSIS — M2550 Pain in unspecified joint: Secondary | ICD-10-CM | POA: Diagnosis not present

## 2020-06-06 DIAGNOSIS — R2681 Unsteadiness on feet: Secondary | ICD-10-CM | POA: Diagnosis not present

## 2020-06-06 DIAGNOSIS — M6281 Muscle weakness (generalized): Secondary | ICD-10-CM | POA: Diagnosis not present

## 2020-06-06 MED ORDER — PYRIDOSTIGMINE BROMIDE 60 MG PO TABS: 30.0000 mg | ORAL_TABLET | Freq: Two times a day (BID) | ORAL | 3 refills | Status: DC

## 2020-06-06 NOTE — Addendum Note (Signed)
Addended by: Antonieta Iba on: 06/06/2020 11:36 AM   Modules accepted: Orders

## 2020-06-06 NOTE — Patient Instructions (Signed)
Medication Instructions:  Your physician has recommended you make the following change in your medication: 1) STOP taking midodrine 2) START taking mestinon 30 mg every 12 hours.  *If you need a refill on your cardiac medications before your next appointment, please call your pharmacy*  Follow-Up: At [facility], you and your health needs are our priority.  As part of our continuing mission to provide you with exceptional heart care, we have created designated Provider Care Teams.  These Care Teams include your primary Cardiologist (physician) and Advanced Practice Providers (APPs -  Physician Assistants and Nurse Practitioners) who all work together to provide you with the care you need, when you need it.  We recommend signing up for the patient portal called "MyChart".  Sign up information is provided on this After Visit Summary.  MyChart is used to connect with patients for Virtual Visits (Telemedicine).  Patients are able to view lab/test results, encounter notes, upcoming appointments, etc.  Non-urgent messages can be sent to your provider as well.   To learn more about what you can do with MyChart, go to [email or web address].    Your next appointment:   2 week(s)  The format for your next appointment:   Either In Person or Virtual  Provider:   Melina Copa, PA-C or Ermalinda Barrios, PA-C

## 2020-06-06 NOTE — Telephone Encounter (Signed)
    Pt c/o medication issue:  1. Name of Medication: pyridostigmine (MESTINON) 60 MG tablet  2. How are you currently taking this medication (dosage and times per day)? Take 0.5 tablets (30 mg total) by mouth every 12 (twelve) hours.  3. Are you having a reaction (difficulty breathing--STAT)?   4. What is your medication issue? Pt's daughter calling and would like to speak with Carly, she said she has question about this drug

## 2020-06-06 NOTE — Telephone Encounter (Signed)
Spoke with the patient's daughter who has some concerns about the Mestinon that was prescribed by Dr. Radford Pax today. She states that she is worried about side effects that it has since her mother is unable to let them know how she is feeling. She would like reassurance from Dr. Radford Pax that it is safe for her mother to take and if there is any specific side effects that they should be looking out for.

## 2020-06-06 NOTE — Progress Notes (Signed)
Virtual Visit via Telephone Note   This visit type was conducted due to national recommendations for restrictions regarding the COVID-19 Pandemic (e.g. social distancing) in an effort to limit this patient's exposure and mitigate transmission in our community.  Due to her co-morbid illnesses, this patient is at least at moderate risk for complications without adequate follow up.  This format is felt to be most appropriate for this patient at this time.  The patient did not have access to video technology/had technical difficulties with video requiring transitioning to audio format only (telephone).  All issues noted in this document were discussed and addressed.  No physical exam could be performed with this format.  Please refer to the patient's chart for her  consent to telehealth for [facility].  Evaluation Performed:  Follow-up visit  This visit type was conducted due to national recommendations for restrictions regarding the COVID-19 Pandemic (e.g. social distancing).  This format is felt to be most appropriate for this patient at this time.  All issues noted in this document were discussed and addressed.  No physical exam was performed (except for noted visual exam findings with Video Visits).  Please refer to the patient's chart (MyChart message for video visits and phone note for telephone visits) for the patient's consent to telehealth for [employer]  Date:  06/06/2020   ID:  Sheila Parrish, DOB [birth year], MRN [id number]  Patient Location:  Home  Provider location:   [place]  PCP:  Lavone Orn, MD  Cardiologist:  Fransico Him, MD  Electrophysiologist:  None   Chief Complaint:  Orthostatic hypotension/syncope  History of Present Illness:    Sheila Parrish is a 86 y.o. female who presents via audio/video conferencing for a telehealth visit today.    This is a 86yo female with a hx of orthostatic hypotension who was recently hospitalized with a syncopal episode.  IRL  was interrogated and showed no arrythmias.  She was   Prior CV studies:   The following studies were reviewed today:  2D echo  Past Medical History:  Diagnosis Date  . High cholesterol   . Scarlet fever 1936   Hospitalized for a month  . Stroke (cerebrum) ([place])   . Syncope    Past Surgical History:  Procedure Laterality Date  . ABDOMINAL HYSTERECTOMY    . implantable loop recorder placement  03/03/2020   Medtronic Reveal Linq model [place] 11(SN [id number]) implanted for cryptogenic stroke  . TOTAL KNEE ARTHROPLASTY Left 2010     Current Meds  Medication Sig  . acetaminophen (TYLENOL) 500 MG tablet Take 500 mg by mouth every 6 (six) hours as needed for headache (pain).  [name] Kitchen aspirin 325 MG tablet Take 1 tablet (325 mg total) by mouth daily.  . clopidogrel (PLAVIX) 75 MG tablet Take 1 tablet (75 mg total) by mouth daily.  . Menthol, Topical Analgesic, (MINERAL ICE EX) Apply 1 application topically as needed.   . midodrine (PROAMATINE) 2.5 MG tablet Take 1 tablet (2.5 mg total) by mouth 2 (two) times daily.  . pantoprazole (PROTONIX) 40 MG tablet Take 1 tablet (40 mg total) by mouth daily.     Allergies:   Donepezil, Alendronate, Atorvastatin, Bactrim [sulfamethoxazole-trimethoprim], Macrodantin [nitrofurantoin macrocrystal], Simvastatin, Sulfa antibiotics, and Azithromycin   Social History   Tobacco Use  . Smoking status: Never Smoker  . Smokeless tobacco: Never Used  Vaping Use  . Vaping Use: Never used  Substance Use Topics  . Alcohol use: No  . Drug use:  No     Family Hx: The patient's family history includes Arthritis in her mother; Stroke (age of onset: 55) in her father. There is no history of CAD.  ROS:   Please see the history of present illness.     All other systems reviewed and are negative.   Labs/Other Tests and Data Reviewed:    Recent Labs: 05/04/2020: TSH 6.828 05/17/2020: ALT 10 05/19/2020: Hemoglobin 10.0; Platelets 383 05/20/2020: BUN 12; Creatinine,  Ser 0.85; Potassium 3.5; Sodium 141   Recent Lipid Panel Lab Results  Component Value Date/Time   CHOL 214 (H) 02/29/2020 03:33 AM   TRIG 77 02/29/2020 03:33 AM   HDL 32 (L) 02/29/2020 03:33 AM   CHOLHDL 6.7 02/29/2020 03:33 AM   LDLCALC 167 (H) 02/29/2020 03:33 AM    Wt Readings from Last 3 Encounters:  06/06/20 114 lb (51.7 kg)  05/22/20 112 lb (50.8 kg)  05/04/20 126 lb 12.2 oz (57.5 kg)     Objective:    Vital Signs:  BP 104/73   Pulse 96   Ht 4\' 11"  (1.499 m)   Wt 114 lb (51.7 kg)   BMI 23.03 kg/m      ASSESSMENT & PLAN:    1.  Syncope/Orthostatic Hypotension -orthostatics markedly abnormal on admit with SBP dropping from 146>>92 upon standing -her syncopal episode, though, are while she was sitting -IRL interrogated and no arrhythmias  -added compression hose and abdominal binder  -2D echo normal -she has not been wearing the abdominal binder when the nurse checks her orthostatic BPs and she has only been wearing the knee high compression hose and not the thigh high hose. -BP usually is around 112/72mmHg when sitting but when laying down her SBP is in the 180's -stop Midodrine -start Mestinon 30mg  q12 hours -followup with PA in 2 weeks to check orthostatic BPs   Time:   Today, I have spent 20 minutes on telephone discussing medical problems including orthostatic hypotension and reviewing patient's chart including hospital notes, 2D echo.  Medication Adjustments/Labs and Tests Ordered: Current medicines are reviewed at length with the patient today.  Concerns regarding medicines are outlined above.  Tests Ordered: No orders of the defined types were placed in this encounter.  Medication Changes: No orders of the defined types were placed in this encounter.   Disposition:  Follow up in 2 week(s) virtual visit with PA  Signed, Fransico Him, MD  06/06/2020 10:21 AM    [place]

## 2020-06-07 NOTE — Telephone Encounter (Signed)
The main side effects are gastrointestinal with diarrhea and abdominal pain.  If she wants to hold off on it and just try the abdominal binder and compression hose I am fine with that but need to stop the midodrine.  If she is in bed most of the time now she may not have the issues with syncope.  There is really no other options except to make her comfortable with Hospice care guidance

## 2020-06-09 ENCOUNTER — Ambulatory Visit (INDEPENDENT_AMBULATORY_CARE_PROVIDER_SITE_OTHER): Payer: Medicare PPO | Admitting: *Deleted

## 2020-06-09 DIAGNOSIS — I6310 Cerebral infarction due to embolism of unspecified precerebral artery: Secondary | ICD-10-CM | POA: Diagnosis not present

## 2020-06-09 DIAGNOSIS — R2681 Unsteadiness on feet: Secondary | ICD-10-CM | POA: Diagnosis not present

## 2020-06-09 DIAGNOSIS — M2550 Pain in unspecified joint: Secondary | ICD-10-CM | POA: Diagnosis not present

## 2020-06-09 DIAGNOSIS — M6281 Muscle weakness (generalized): Secondary | ICD-10-CM | POA: Diagnosis not present

## 2020-06-09 LAB — CUP PACEART REMOTE DEVICE CHECK
Date Time Interrogation Session: 20210628001134
Implantable Pulse Generator Implant Date: 20210322

## 2020-06-09 NOTE — Telephone Encounter (Signed)
Spoke with the patient's daughter and advised her on Dr. Theodosia Blender recommendations. She states that they have stopped the midodrine and have already started on the Mestinon. She reports that so far the patient has been tolerating it well and BP laying down as been around 140/70. She will continue to take Mestinon for now.

## 2020-06-10 DIAGNOSIS — M2550 Pain in unspecified joint: Secondary | ICD-10-CM | POA: Diagnosis not present

## 2020-06-10 DIAGNOSIS — R2681 Unsteadiness on feet: Secondary | ICD-10-CM | POA: Diagnosis not present

## 2020-06-10 DIAGNOSIS — M6281 Muscle weakness (generalized): Secondary | ICD-10-CM | POA: Diagnosis not present

## 2020-06-10 NOTE — Progress Notes (Signed)
Carelink Summary Report / Loop Recorder 

## 2020-06-11 DIAGNOSIS — Z1159 Encounter for screening for other viral diseases: Secondary | ICD-10-CM | POA: Diagnosis not present

## 2020-06-11 DIAGNOSIS — Z20828 Contact with and (suspected) exposure to other viral communicable diseases: Secondary | ICD-10-CM | POA: Diagnosis not present

## 2020-06-13 DIAGNOSIS — R2681 Unsteadiness on feet: Secondary | ICD-10-CM | POA: Diagnosis not present

## 2020-06-13 DIAGNOSIS — M2550 Pain in unspecified joint: Secondary | ICD-10-CM | POA: Diagnosis not present

## 2020-06-13 DIAGNOSIS — M6281 Muscle weakness (generalized): Secondary | ICD-10-CM | POA: Diagnosis not present

## 2020-06-17 DIAGNOSIS — M6281 Muscle weakness (generalized): Secondary | ICD-10-CM | POA: Diagnosis not present

## 2020-06-17 DIAGNOSIS — M2550 Pain in unspecified joint: Secondary | ICD-10-CM | POA: Diagnosis not present

## 2020-06-17 DIAGNOSIS — R2681 Unsteadiness on feet: Secondary | ICD-10-CM | POA: Diagnosis not present

## 2020-06-18 ENCOUNTER — Other Ambulatory Visit: Payer: Medicare PPO | Admitting: Internal Medicine

## 2020-06-18 ENCOUNTER — Other Ambulatory Visit: Payer: Self-pay

## 2020-06-18 DIAGNOSIS — R2681 Unsteadiness on feet: Secondary | ICD-10-CM | POA: Diagnosis not present

## 2020-06-18 DIAGNOSIS — M2550 Pain in unspecified joint: Secondary | ICD-10-CM | POA: Diagnosis not present

## 2020-06-18 DIAGNOSIS — Z515 Encounter for palliative care: Secondary | ICD-10-CM

## 2020-06-18 DIAGNOSIS — M6281 Muscle weakness (generalized): Secondary | ICD-10-CM | POA: Diagnosis not present

## 2020-06-18 DIAGNOSIS — Z1159 Encounter for screening for other viral diseases: Secondary | ICD-10-CM | POA: Diagnosis not present

## 2020-06-18 DIAGNOSIS — Z20828 Contact with and (suspected) exposure to other viral communicable diseases: Secondary | ICD-10-CM | POA: Diagnosis not present

## 2020-06-18 DIAGNOSIS — F0150 Vascular dementia without behavioral disturbance: Secondary | ICD-10-CM

## 2020-06-19 DIAGNOSIS — M2550 Pain in unspecified joint: Secondary | ICD-10-CM | POA: Diagnosis not present

## 2020-06-19 DIAGNOSIS — R2681 Unsteadiness on feet: Secondary | ICD-10-CM | POA: Diagnosis not present

## 2020-06-19 DIAGNOSIS — M6281 Muscle weakness (generalized): Secondary | ICD-10-CM | POA: Diagnosis not present

## 2020-06-19 NOTE — Progress Notes (Signed)
Virtual Visit via Video Note   This visit type was conducted due to national recommendations for restrictions regarding the COVID-19 Pandemic (e.g. social distancing) in an effort to limit this patient's exposure and mitigate transmission in our community.  Due to her co-morbid illnesses, this patient is at least at moderate risk for complications without adequate follow up.  This format is felt to be most appropriate for this patient at this time.  All issues noted in this document were discussed and addressed.  A limited physical exam was performed with this format.  Please refer to the patient's chart for her consent to telehealth for [facility]   The patient was identified using 2 identifiers.  Date:  06/25/2020   ID:  [name], DOB 02/01/1927, MRN [id number]  Patient Location: Home Provider Location: Office/Clinic  PCP:  Lavone Orn, MD  Cardiologist:  Fransico Him, MD   Electrophysiologist:  None   Evaluation Performed:  Follow-Up Visit  Chief Complaint:  Orthostatic hypotension  Patient Profile: ANNALI LYBRAND is a 86 y.o. female with:  Orthostatic hypotension  Hx of CVA  S/p ILR  Hyperlipidemia   Syncope   Prior CV Studies: Echocardiogram 05/03/2020 EF 65-70, normal wall motion, grade 1 diastolic dysfunction, normal RV SF, RVSP 37.8, mild LAE, mild MR, mild AI  Myoview 05/19/2017 EF 66, no ischemia or infarction, normal study  History of Present Illness:   Ms. Luebbe was last seen by Dr. Radford Pax 06/06/20 via Telemedicine.  She had been admitted with syncope and orthostatic hypotension (BP dropped 146 >> 92 w standing).  She had no arrhythmias on ILR interrogation.  Her BP was in the 180s while lying.  Dr. Radford Pax stopped her Midodrine and placed her on Pyridostigmine.  She is seen today along with her daughter and nurse.  She has not complained of chest pain, shortness of breath. She has not had syncope.  Her BP lying and sitting has been much better.  Her  BP is usually 90s-110s.    Past Medical History:  Diagnosis Date  . High cholesterol   . Scarlet fever 1936   Hospitalized for a month  . Stroke (cerebrum) ([place])   . Syncope    Past Surgical History:  Procedure Laterality Date  . ABDOMINAL HYSTERECTOMY    . implantable loop recorder placement  03/03/2020   Medtronic Reveal Linq model [place] 11(SN [id number]) implanted for cryptogenic stroke  . TOTAL KNEE ARTHROPLASTY Left 2010     Current Meds  Medication Sig  . acetaminophen (TYLENOL) 500 MG tablet Take 500 mg by mouth every 6 (six) hours as needed for headache (pain).  [name] Kitchen aspirin 325 MG tablet Take 1 tablet (325 mg total) by mouth daily.  . clopidogrel (PLAVIX) 75 MG tablet Take 1 tablet (75 mg total) by mouth daily.  . fludrocortisone (FLORINEF) 0.1 MG tablet Take 100 mcg by mouth every morning.  . Menthol, Topical Analgesic, (MINERAL ICE EX) Apply 1 application topically as needed.   . polyethylene glycol (MIRALAX / GLYCOLAX) 17 g packet Take 17 g by mouth as needed for moderate constipation.  [name] Kitchen pyridostigmine (MESTINON) 60 MG tablet Take 0.5 tablets (30 mg total) by mouth every 12 (twelve) hours.  . [DISCONTINUED] pantoprazole (PROTONIX) 40 MG tablet Take 1 tablet (40 mg total) by mouth daily.     Allergies:   Donepezil, Alendronate, Atorvastatin, Bactrim [sulfamethoxazole-trimethoprim], Macrodantin [nitrofurantoin macrocrystal], Simvastatin, Sulfa antibiotics, and Azithromycin   Social History   Tobacco Use  . Smoking status:  Never Smoker  . Smokeless tobacco: Never Used  Vaping Use  . Vaping Use: Never used  Substance Use Topics  . Alcohol use: No  . Drug use: No     Family Hx: The patient's family history includes Arthritis in her mother; Stroke (age of onset: 95) in her father. There is no history of CAD.  ROS:   Please see the history of present illness.     Labs/Other Tests and Data Reviewed:    EKG:  No ECG reviewed.  Recent Labs: 05/04/2020: TSH  6.828 05/17/2020: ALT 10 05/19/2020: Hemoglobin 10.0; Platelets 383 05/20/2020: BUN 12; Creatinine, Ser 0.85; Potassium 3.5; Sodium 141   Recent Lipid Panel Lab Results  Component Value Date/Time   CHOL 214 (H) 02/29/2020 03:33 AM   TRIG 77 02/29/2020 03:33 AM   HDL 32 (L) 02/29/2020 03:33 AM   CHOLHDL 6.7 02/29/2020 03:33 AM   LDLCALC 167 (H) 02/29/2020 03:33 AM    Wt Readings from Last 3 Encounters:  06/06/20 114 lb (51.7 kg)  05/22/20 112 lb (50.8 kg)  05/04/20 126 lb 12.2 oz (57.5 kg)     Objective:    Vital Signs:  BP (!) 87/59   Pulse 85    VITAL SIGNS:  reviewed GEN:  no acute distress  ASSESSMENT & PLAN:    1. Orthostatic hypotension BP seems better lying and sitting.  Her usual BP is 90s-110s.  Today, it is unusual for her BP to be this low.  I have asked her daughter and nurse to push fluids, add a little extra salt to her diet today and keep an eye on her BP.  I have asked them to update [place] later with her readings.   If her BP remains low, we could consider increasing pyridostigmine to 60 mg twice daily. FU in 4-6 weeks or sooner if needed.    2. History of GI bleed She is out of protonix.  As she is on dual antiplatelet Rx with her hx of CVA, she should remain on PPI for GI protection. I will refill this for her today.      Time:   Today, I have spent 8 minutes with the patient with telehealth technology discussing the above problems.     Medication Adjustments/Labs and Tests Ordered: Current medicines are reviewed at length with the patient today.  Concerns regarding medicines are outlined above.   Tests Ordered: No orders of the defined types were placed in this encounter.   Medication Changes: Meds ordered this encounter  Medications  . pantoprazole (PROTONIX) 40 MG tablet    Sig: Take 1 tablet (40 mg total) by mouth daily.    Dispense:  30 tablet    Refill:  3    Order Specific Question:   Supervising Provider    Answer:   Lelon Perla [[id number]]     Follow Up:  Virtual Visit  in 4-6 week(s)  Signed, Richardson Dopp, PA-C  06/25/2020 3:42 PM    [facility]

## 2020-06-20 ENCOUNTER — Ambulatory Visit: Payer: Medicare PPO | Admitting: Physician Assistant

## 2020-06-23 NOTE — Progress Notes (Signed)
[facility] Consult Note Telephone: [phone]  Fax: [phone]  PATIENT NAME: Sheila Parrish DOB: [birth year] MRN: [id number]  PRIMARY CARE PROVIDER:   Lavone Orn, MD  REFERRING PROVIDER:  Lavone Orn, MD [facility] [employer] [address],  [place] [address]  RESPONSIBLE PARTYAntoine Parrish Daughter/ POA [phone]  [phone]     RECOMMENDATIONS and PLAN:  Palliative care encounter   Z51.5  1.Advance care planning:  Explanation of palliative and hospice care.  Goals of care per daughter are to provide comfort measures and safety with the assistance of hired caregivers. Reviewed MOST form  with daughter/POA.  Her advanced directives are DNAR/DNI, and no tube feedings.Daughter would like to discuss remainder of determinations with her siblings.  MOST form was left with daughter for review and will be completed during next visit.  Palliative care will continue to f/u with patient.  2.  Vascular dementia without behavioral disturbances:  Related to post CVAs and chronic small vessel disease(noted in head CT scan report from 05/02/20).  Advanced with FAST stage of 7c.  Continue to perform all ADLs and assist with feedings.   Monitor cognitive and functional abilities.    3.  Dysphagia:  Post CVAs.  Aspiration precautions explained and encouraged.  Comfort feedings.   4.  Alteration of skin integrity:  Turn and reposition every 2 hours.  Float heels while in bed.  Apply skin prep to bilat heels. Monitor  I spent 60 minutes providing this consultation,  From 0930  to 1030. More than 50% of the time in this consultation was spent coordinating communication with daughter, patient and caregiver.   HISTORY OF PRESENT ILLNESS:  Sheila Parrish is a 86 y.o. year old female with multiple medical problems including multiple CVAs, atrial fib., vascular dementia without behavioral disturbance and syncope.  Daughter reports several recent  occurrences of orthostatic hypotension from a seated position. Some episodes result in loss of consciousness and hospitalization most recently from 6/5-6/10.  Reports decreased since beginning use of Midodrine.  Loop recorder in place. Pt requires total care for all ADLs and is incontinent of B&B.  She occasionally can ambulate with manual and rollator assistance. Palliative Care was asked to help address goals of care.   CODE STATUS: DNAR  PPS: 40% transfers to chair with assistance.   HOSPICE ELIGIBILITY/DIAGNOSIS: TBD  PAST MEDICAL HISTORY:  Past Medical History:  Diagnosis Date   High cholesterol    Scarlet fever 1936   Hospitalized for a month   Stroke (cerebrum) ([place])    Syncope     SOCIAL HX: Retired Pharmacist, hospital   ALLERGIES:  Allergies  Allergen Reactions   Donepezil Nausea And Vomiting   Alendronate Other (See Comments)    Difficulty swallowing   Atorvastatin Other (See Comments)    Leg cramping   Bactrim [Sulfamethoxazole-Trimethoprim] Nausea And Vomiting   Macrodantin [Nitrofurantoin Macrocrystal] Other (See Comments)    Unknown reaction   Simvastatin Other (See Comments)    Leg cramping   Sulfa Antibiotics Nausea And Vomiting   Azithromycin Rash     PERTINENT MEDICATIONS:  Outpatient Encounter Medications as of 06/18/2020  Medication Sig   acetaminophen (TYLENOL) 500 MG tablet Take 500 mg by mouth every 6 (six) hours as needed for headache (pain).   aspirin 325 MG tablet Take 1 tablet (325 mg total) by mouth daily.   clopidogrel (PLAVIX) 75 MG tablet Take 1 tablet (75 mg total) by mouth daily.  Menthol, Topical Analgesic, (MINERAL ICE EX) Apply 1 application topically as needed.    pantoprazole (PROTONIX) 40 MG tablet Take 1 tablet (40 mg total) by mouth daily.   polyethylene glycol (MIRALAX / GLYCOLAX) 17 g packet Take 17 g by mouth daily. (Patient not taking: Reported on 06/06/2020)   pyridostigmine (MESTINON) 60 MG tablet Take 0.5 tablets (30 mg  total) by mouth every 12 (twelve) hours.   No facility-administered encounter medications on file as of 06/18/2020.    PHYSICAL EXAM:   General: NAD, frail and chronically appearing elderly female reclined in bed Cardiovascular: regular rate and rhythm Pulmonary: clear ant fields Abdomen: soft, nontender, + bowel sounds GU: no suprapubic tenderness Extremities: no edema, no joint deformities Skin: Pale in color, raised purpura nodule of the R lateral thigh. Peeling superficial dermis of the L heel Neurological: Somnolent, opens eyes when name called.  Nods head yes or no occasionally.  Does not follow commands. Weakness. Non-verbal  Sheila Lex, NP-C

## 2020-06-25 ENCOUNTER — Telehealth: Payer: Self-pay | Admitting: *Deleted

## 2020-06-25 ENCOUNTER — Encounter: Payer: Self-pay | Admitting: Physician Assistant

## 2020-06-25 ENCOUNTER — Other Ambulatory Visit: Payer: Self-pay

## 2020-06-25 ENCOUNTER — Telehealth (INDEPENDENT_AMBULATORY_CARE_PROVIDER_SITE_OTHER): Payer: Medicare PPO | Admitting: Physician Assistant

## 2020-06-25 VITALS — BP 87/59 | HR 85

## 2020-06-25 DIAGNOSIS — M6281 Muscle weakness (generalized): Secondary | ICD-10-CM | POA: Diagnosis not present

## 2020-06-25 DIAGNOSIS — Z1159 Encounter for screening for other viral diseases: Secondary | ICD-10-CM | POA: Diagnosis not present

## 2020-06-25 DIAGNOSIS — I951 Orthostatic hypotension: Secondary | ICD-10-CM | POA: Diagnosis not present

## 2020-06-25 DIAGNOSIS — Z20828 Contact with and (suspected) exposure to other viral communicable diseases: Secondary | ICD-10-CM | POA: Diagnosis not present

## 2020-06-25 DIAGNOSIS — Z8719 Personal history of other diseases of the digestive system: Secondary | ICD-10-CM | POA: Diagnosis not present

## 2020-06-25 DIAGNOSIS — M2550 Pain in unspecified joint: Secondary | ICD-10-CM | POA: Diagnosis not present

## 2020-06-25 DIAGNOSIS — R2681 Unsteadiness on feet: Secondary | ICD-10-CM | POA: Diagnosis not present

## 2020-06-25 MED ORDER — PANTOPRAZOLE SODIUM 40 MG PO TBEC
40.0000 mg | DELAYED_RELEASE_TABLET | Freq: Every day | ORAL | 3 refills | Status: DC
Start: 2020-06-25 — End: 2020-09-12

## 2020-06-25 NOTE — Patient Instructions (Addendum)
Medication Instructions:  Your physician recommends that you continue on your current medications as directed. Please refer to the Current Medication list given to you today.  *If you need a refill on your cardiac medications before your next appointment, please call your pharmacy*  Lab Work: None ordered today  Testing/Procedures:  None ordered today  Follow-Up:  On 08/12/20 at 4:00PM with Fransico Him, MD  Other Instructions Push fluids and salt, call with blood pressures later today or tomorrow morning.

## 2020-06-25 NOTE — Telephone Encounter (Signed)
  Patient Consent for Virtual Visit         Sheila Parrish has provided verbal consent on 06/25/2020 for a virtual visit (video or telephone).   CONSENT FOR VIRTUAL VISIT FOR:  Sheila Parrish  By participating in this virtual visit I agree to the following:  I hereby voluntarily request, consent and authorize [facility] and its employed or contracted physicians, physician assistants, nurse practitioners or other licensed health care professionals (the Practitioner), to provide me with telemedicine health care services (the "Services") as deemed necessary by the treating Practitioner. I acknowledge and consent to receive the Services by the Practitioner via telemedicine. I understand that the telemedicine visit will involve communicating with the Practitioner through live audiovisual communication technology and the disclosure of certain medical information by electronic transmission. I acknowledge that I have been given the opportunity to request an in-person assessment or other available alternative prior to the telemedicine visit and am voluntarily participating in the telemedicine visit.  I understand that I have the right to withhold or withdraw my consent to the use of telemedicine in the course of my care at any time, without affecting my right to future care or treatment, and that the Practitioner or I may terminate the telemedicine visit at any time. I understand that I have the right to inspect all information obtained and/or recorded in the course of the telemedicine visit and may receive copies of available information for a reasonable fee.  I understand that some of the potential risks of receiving the Services via telemedicine include:  [name] Kitchen Delay or interruption in medical evaluation due to technological equipment failure or disruption; . Information transmitted may not be sufficient (e.g. poor resolution of images) to allow for appropriate medical decision making by the Practitioner;  and/or  . In rare instances, security protocols could fail, causing a breach of personal health information.  Furthermore, I acknowledge that it is my responsibility to provide information about my medical history, conditions and care that is complete and accurate to the best of my ability. I acknowledge that Practitioner's advice, recommendations, and/or decision may be based on factors not within their control, such as incomplete or inaccurate data provided by me or distortions of diagnostic images or specimens that may result from electronic transmissions. I understand that the practice of medicine is not an exact science and that Practitioner makes no warranties or guarantees regarding treatment outcomes. I acknowledge that a copy of this consent can be made available to me via my patient portal ([facility]), or I can request a printed copy by calling the office of [facility].    I understand that my insurance will be billed for this visit.   I have read or had this consent read to me. . I understand the contents of this consent, which adequately explains the benefits and risks of the Services being provided via telemedicine.  . I have been provided ample opportunity to ask questions regarding this consent and the Services and have had my questions answered to my satisfaction. . I give my informed consent for the services to be provided through the use of telemedicine in my medical care

## 2020-06-26 ENCOUNTER — Telehealth: Payer: Self-pay | Admitting: Physician Assistant

## 2020-06-26 DIAGNOSIS — M6281 Muscle weakness (generalized): Secondary | ICD-10-CM | POA: Diagnosis not present

## 2020-06-26 DIAGNOSIS — M2550 Pain in unspecified joint: Secondary | ICD-10-CM | POA: Diagnosis not present

## 2020-06-26 DIAGNOSIS — R2681 Unsteadiness on feet: Secondary | ICD-10-CM | POA: Diagnosis not present

## 2020-06-26 NOTE — Telephone Encounter (Signed)
New message   B/p readings :  06/25/20 124/65  75 hr lying down  108/64 82 hr                           06/26/20  185/81 78 hr   96/66 83 hr

## 2020-06-26 NOTE — Telephone Encounter (Signed)
Ok.   Continue to monitor. Notify us if she has more BP readings > 122 (systolic). Richardson Dopp, PA-C    06/26/2020 4:58 PM

## 2020-06-27 DIAGNOSIS — M2550 Pain in unspecified joint: Secondary | ICD-10-CM | POA: Diagnosis not present

## 2020-06-27 DIAGNOSIS — M6281 Muscle weakness (generalized): Secondary | ICD-10-CM | POA: Diagnosis not present

## 2020-06-27 DIAGNOSIS — R2681 Unsteadiness on feet: Secondary | ICD-10-CM | POA: Diagnosis not present

## 2020-06-27 NOTE — Telephone Encounter (Signed)
I called and spoke with patients daughter [name]. She is aware per Nicki Reaper that patients blood pressures look ok and to continue to monitor. [name] aware to call the office if patient has any more systolic readings of 525 or higher. [name] verbalized understanding and thanked me for the call.

## 2020-06-30 DIAGNOSIS — R634 Abnormal weight loss: Secondary | ICD-10-CM | POA: Diagnosis not present

## 2020-06-30 DIAGNOSIS — M2550 Pain in unspecified joint: Secondary | ICD-10-CM | POA: Diagnosis not present

## 2020-06-30 DIAGNOSIS — R2681 Unsteadiness on feet: Secondary | ICD-10-CM | POA: Diagnosis not present

## 2020-06-30 DIAGNOSIS — M810 Age-related osteoporosis without current pathological fracture: Secondary | ICD-10-CM | POA: Diagnosis not present

## 2020-06-30 DIAGNOSIS — Z0000 Encounter for general adult medical examination without abnormal findings: Secondary | ICD-10-CM | POA: Diagnosis not present

## 2020-06-30 DIAGNOSIS — R3981 Functional urinary incontinence: Secondary | ICD-10-CM | POA: Diagnosis not present

## 2020-06-30 DIAGNOSIS — Z1331 Encounter for screening for depression: Secondary | ICD-10-CM | POA: Diagnosis not present

## 2020-06-30 DIAGNOSIS — M6281 Muscle weakness (generalized): Secondary | ICD-10-CM | POA: Diagnosis not present

## 2020-06-30 DIAGNOSIS — Z8673 Personal history of transient ischemic attack (TIA), and cerebral infarction without residual deficits: Secondary | ICD-10-CM | POA: Diagnosis not present

## 2020-06-30 DIAGNOSIS — I951 Orthostatic hypotension: Secondary | ICD-10-CM | POA: Diagnosis not present

## 2020-06-30 DIAGNOSIS — F0390 Unspecified dementia without behavioral disturbance: Secondary | ICD-10-CM | POA: Diagnosis not present

## 2020-06-30 DIAGNOSIS — R2241 Localized swelling, mass and lump, right lower limb: Secondary | ICD-10-CM | POA: Diagnosis not present

## 2020-07-02 DIAGNOSIS — Z1159 Encounter for screening for other viral diseases: Secondary | ICD-10-CM | POA: Diagnosis not present

## 2020-07-02 DIAGNOSIS — M2550 Pain in unspecified joint: Secondary | ICD-10-CM | POA: Diagnosis not present

## 2020-07-02 DIAGNOSIS — R2681 Unsteadiness on feet: Secondary | ICD-10-CM | POA: Diagnosis not present

## 2020-07-02 DIAGNOSIS — M6281 Muscle weakness (generalized): Secondary | ICD-10-CM | POA: Diagnosis not present

## 2020-07-02 DIAGNOSIS — Z20828 Contact with and (suspected) exposure to other viral communicable diseases: Secondary | ICD-10-CM | POA: Diagnosis not present

## 2020-07-04 DIAGNOSIS — M2550 Pain in unspecified joint: Secondary | ICD-10-CM | POA: Diagnosis not present

## 2020-07-04 DIAGNOSIS — R2681 Unsteadiness on feet: Secondary | ICD-10-CM | POA: Diagnosis not present

## 2020-07-04 DIAGNOSIS — M6281 Muscle weakness (generalized): Secondary | ICD-10-CM | POA: Diagnosis not present

## 2020-07-07 DIAGNOSIS — M2550 Pain in unspecified joint: Secondary | ICD-10-CM | POA: Diagnosis not present

## 2020-07-07 DIAGNOSIS — M6281 Muscle weakness (generalized): Secondary | ICD-10-CM | POA: Diagnosis not present

## 2020-07-07 DIAGNOSIS — R2681 Unsteadiness on feet: Secondary | ICD-10-CM | POA: Diagnosis not present

## 2020-07-09 ENCOUNTER — Other Ambulatory Visit: Payer: Medicare PPO | Admitting: Internal Medicine

## 2020-07-09 DIAGNOSIS — Z20828 Contact with and (suspected) exposure to other viral communicable diseases: Secondary | ICD-10-CM | POA: Diagnosis not present

## 2020-07-09 DIAGNOSIS — K5900 Constipation, unspecified: Secondary | ICD-10-CM | POA: Diagnosis not present

## 2020-07-09 DIAGNOSIS — M6281 Muscle weakness (generalized): Secondary | ICD-10-CM | POA: Diagnosis not present

## 2020-07-09 DIAGNOSIS — Z515 Encounter for palliative care: Secondary | ICD-10-CM | POA: Diagnosis not present

## 2020-07-09 DIAGNOSIS — R2681 Unsteadiness on feet: Secondary | ICD-10-CM | POA: Diagnosis not present

## 2020-07-09 DIAGNOSIS — F0150 Vascular dementia without behavioral disturbance: Secondary | ICD-10-CM | POA: Diagnosis not present

## 2020-07-09 DIAGNOSIS — Z1159 Encounter for screening for other viral diseases: Secondary | ICD-10-CM | POA: Diagnosis not present

## 2020-07-09 DIAGNOSIS — M2550 Pain in unspecified joint: Secondary | ICD-10-CM | POA: Diagnosis not present

## 2020-07-11 DIAGNOSIS — M6281 Muscle weakness (generalized): Secondary | ICD-10-CM | POA: Diagnosis not present

## 2020-07-11 DIAGNOSIS — M2550 Pain in unspecified joint: Secondary | ICD-10-CM | POA: Diagnosis not present

## 2020-07-11 DIAGNOSIS — R2681 Unsteadiness on feet: Secondary | ICD-10-CM | POA: Diagnosis not present

## 2020-07-14 ENCOUNTER — Ambulatory Visit (INDEPENDENT_AMBULATORY_CARE_PROVIDER_SITE_OTHER): Payer: Medicare PPO | Admitting: *Deleted

## 2020-07-14 DIAGNOSIS — I6310 Cerebral infarction due to embolism of unspecified precerebral artery: Secondary | ICD-10-CM

## 2020-07-14 LAB — CUP PACEART REMOTE DEVICE CHECK
Date Time Interrogation Session: 20210731001520
Implantable Pulse Generator Implant Date: 20210322

## 2020-07-15 DIAGNOSIS — M6281 Muscle weakness (generalized): Secondary | ICD-10-CM | POA: Diagnosis not present

## 2020-07-15 DIAGNOSIS — R2681 Unsteadiness on feet: Secondary | ICD-10-CM | POA: Diagnosis not present

## 2020-07-15 DIAGNOSIS — M2550 Pain in unspecified joint: Secondary | ICD-10-CM | POA: Diagnosis not present

## 2020-07-16 DIAGNOSIS — Z1159 Encounter for screening for other viral diseases: Secondary | ICD-10-CM | POA: Diagnosis not present

## 2020-07-16 DIAGNOSIS — Z20828 Contact with and (suspected) exposure to other viral communicable diseases: Secondary | ICD-10-CM | POA: Diagnosis not present

## 2020-07-16 NOTE — Progress Notes (Signed)
Carelink Summary Report / Loop Recorder 

## 2020-07-17 DIAGNOSIS — R2681 Unsteadiness on feet: Secondary | ICD-10-CM | POA: Diagnosis not present

## 2020-07-17 DIAGNOSIS — M6281 Muscle weakness (generalized): Secondary | ICD-10-CM | POA: Diagnosis not present

## 2020-07-17 DIAGNOSIS — M2550 Pain in unspecified joint: Secondary | ICD-10-CM | POA: Diagnosis not present

## 2020-07-17 NOTE — Progress Notes (Addendum)
[facility] Consult Note Telephone: [phone]  Fax: [phone]  PATIENT NAME: Sheila Parrish DOB: [birth year] MRN: [id number]  PRIMARY CARE PROVIDER:   Lavone Orn, MD  REFERRING PROVIDER:  Lavone Orn, MD [facility] [employer] [address],  [place] [address]  RESPONSIBLE PARTYAntoine Poche Daughter/ POA [phone]  [phone]     RECOMMENDATIONS and PLAN:  Palliative care encounter   Z51.5  1.Advance care planning:   Goals of care per daughter are to provide comfort measures and safety with the assistance of hired caregivers in current residential community.  Her advanced directives are DNAR/DNI, and no tube feedings.  Palliative care will continue to f/u with patient.  2.  Vascular dementia without behavioral disturbances: Improved mobility with supervision. Related to post CVAs and chronic small vessel disease.  Advanced with FAST stage of 7c.  Continue to perform all ADLs and assist with feedings.   Monitor cognitive and functional abilities.    3.  Dysphagia:  At baseline.  Post CVAs.  Aspiration precautions explained and encouraged.  Comfort feedings.   4.  Alteration of skin integrity: Improved. Continue turning and repositioning every 2 hours.  Barrier cream.  Seat cushion.  Monitor  5.  Constipation:  Increase hydration.  Dulcolax suppository and Miralax( continue every other day until good bowel regimen achieved) dose today.    I spent 40 minutes providing this consultation,  From 1000  to 1040. More than 50% of the time in this consultation was spent coordinating communication with daughter, patient and caregiver.   HISTORY OF PRESENT ILLNESS:  Followup with  Sheila Parrish Daughter and caregiver report improvement of mobility and nutritional intake since beginning physical therapy.  No recent occurrences of  orthostatic hypotension.  She is experiencing constipation today. Reports recent bloody drainage from  mass of R lateral hip. No treatment required. Pt requires total care for all ADLs and is incontinent of B&B. Palliative Care was asked to help address goals of care.   CODE STATUS: DNAR  PPS: 40% transfers to chair with assistance.    HOSPICE ELIGIBILITY/DIAGNOSIS: TBD  PAST MEDICAL HISTORY:  Past Medical History:  Diagnosis Date  . High cholesterol   . Scarlet fever 1936   Hospitalized for a month  . Stroke (cerebrum) ([place])   . Syncope     SOCIAL HX: Retired Pharmacist, hospital   ALLERGIES:  Allergies  Allergen Reactions  . Donepezil Nausea And Vomiting  . Alendronate Other (See Comments)    Difficulty swallowing  . Atorvastatin Other (See Comments)    Leg cramping  . Bactrim [Sulfamethoxazole-Trimethoprim] Nausea And Vomiting  . Macrodantin [Nitrofurantoin Macrocrystal] Other (See Comments)    Unknown reaction  . Simvastatin Other (See Comments)    Leg cramping  . Sulfa Antibiotics Nausea And Vomiting  . Azithromycin Rash     PERTINENT MEDICATIONS:  Outpatient Encounter Medications as of 06/18/2020  Medication Sig  . acetaminophen (TYLENOL) 500 MG tablet Take 500 mg by mouth every 6 (six) hours as needed for headache (pain).  [name] Kitchen aspirin 325 MG tablet Take 1 tablet (325 mg total) by mouth daily.  . clopidogrel (PLAVIX) 75 MG tablet Take 1 tablet (75 mg total) by mouth daily.  . Menthol, Topical Analgesic, (MINERAL ICE EX) Apply 1 application topically as needed.   . pantoprazole (PROTONIX) 40 MG tablet Take 1 tablet (40 mg total) by mouth daily.  . polyethylene glycol (MIRALAX / GLYCOLAX) 17 g packet Take  17 g by mouth daily. (Patient not taking: Reported on 06/06/2020)  . pyridostigmine (MESTINON) 60 MG tablet Take 0.5 tablets (30 mg total) by mouth every 12 (twelve) hours.   No facility-administered encounter medications on file as of 06/18/2020.    PHYSICAL EXAM:   General: NAD, frail and chronically appearing elderly female seated in chair Cardiovascular: regular rate and  rhythm Pulmonary: clear throughout Abdomen: soft, nontender, + bowel sounds GU: no suprapubic tenderness Extremities: no edema Skin: Pale in color, raised purpura nodule of the R lateral thigh(smaller in size). Healed skin of sacral region.  Neurological: Alert.  Unable to determine orientation due to cognitive deficit  Speaks occasionally in 1 word response  Good eye contact. Weakness.  Gonzella Lex, NP-C

## 2020-07-18 DIAGNOSIS — M2550 Pain in unspecified joint: Secondary | ICD-10-CM | POA: Diagnosis not present

## 2020-07-18 DIAGNOSIS — R2681 Unsteadiness on feet: Secondary | ICD-10-CM | POA: Diagnosis not present

## 2020-07-18 DIAGNOSIS — M6281 Muscle weakness (generalized): Secondary | ICD-10-CM | POA: Diagnosis not present

## 2020-07-21 DIAGNOSIS — M2550 Pain in unspecified joint: Secondary | ICD-10-CM | POA: Diagnosis not present

## 2020-07-21 DIAGNOSIS — M6281 Muscle weakness (generalized): Secondary | ICD-10-CM | POA: Diagnosis not present

## 2020-07-21 DIAGNOSIS — R2681 Unsteadiness on feet: Secondary | ICD-10-CM | POA: Diagnosis not present

## 2020-07-23 DIAGNOSIS — M6281 Muscle weakness (generalized): Secondary | ICD-10-CM | POA: Diagnosis not present

## 2020-07-23 DIAGNOSIS — M2550 Pain in unspecified joint: Secondary | ICD-10-CM | POA: Diagnosis not present

## 2020-07-23 DIAGNOSIS — R2681 Unsteadiness on feet: Secondary | ICD-10-CM | POA: Diagnosis not present

## 2020-07-23 DIAGNOSIS — Z1159 Encounter for screening for other viral diseases: Secondary | ICD-10-CM | POA: Diagnosis not present

## 2020-07-23 DIAGNOSIS — Z20828 Contact with and (suspected) exposure to other viral communicable diseases: Secondary | ICD-10-CM | POA: Diagnosis not present

## 2020-07-26 DIAGNOSIS — R2681 Unsteadiness on feet: Secondary | ICD-10-CM | POA: Diagnosis not present

## 2020-07-26 DIAGNOSIS — M6281 Muscle weakness (generalized): Secondary | ICD-10-CM | POA: Diagnosis not present

## 2020-07-26 DIAGNOSIS — M2550 Pain in unspecified joint: Secondary | ICD-10-CM | POA: Diagnosis not present

## 2020-07-29 DIAGNOSIS — R2681 Unsteadiness on feet: Secondary | ICD-10-CM | POA: Diagnosis not present

## 2020-07-29 DIAGNOSIS — M6281 Muscle weakness (generalized): Secondary | ICD-10-CM | POA: Diagnosis not present

## 2020-07-29 DIAGNOSIS — M2550 Pain in unspecified joint: Secondary | ICD-10-CM | POA: Diagnosis not present

## 2020-07-30 DIAGNOSIS — R2681 Unsteadiness on feet: Secondary | ICD-10-CM | POA: Diagnosis not present

## 2020-07-30 DIAGNOSIS — Z20828 Contact with and (suspected) exposure to other viral communicable diseases: Secondary | ICD-10-CM | POA: Diagnosis not present

## 2020-07-30 DIAGNOSIS — Z1159 Encounter for screening for other viral diseases: Secondary | ICD-10-CM | POA: Diagnosis not present

## 2020-07-30 DIAGNOSIS — M6281 Muscle weakness (generalized): Secondary | ICD-10-CM | POA: Diagnosis not present

## 2020-07-30 DIAGNOSIS — M2550 Pain in unspecified joint: Secondary | ICD-10-CM | POA: Diagnosis not present

## 2020-08-01 DIAGNOSIS — M6281 Muscle weakness (generalized): Secondary | ICD-10-CM | POA: Diagnosis not present

## 2020-08-01 DIAGNOSIS — R2681 Unsteadiness on feet: Secondary | ICD-10-CM | POA: Diagnosis not present

## 2020-08-01 DIAGNOSIS — M2550 Pain in unspecified joint: Secondary | ICD-10-CM | POA: Diagnosis not present

## 2020-08-04 DIAGNOSIS — M6281 Muscle weakness (generalized): Secondary | ICD-10-CM | POA: Diagnosis not present

## 2020-08-04 DIAGNOSIS — R2681 Unsteadiness on feet: Secondary | ICD-10-CM | POA: Diagnosis not present

## 2020-08-04 DIAGNOSIS — M2550 Pain in unspecified joint: Secondary | ICD-10-CM | POA: Diagnosis not present

## 2020-08-06 DIAGNOSIS — Z1159 Encounter for screening for other viral diseases: Secondary | ICD-10-CM | POA: Diagnosis not present

## 2020-08-06 DIAGNOSIS — Z20828 Contact with and (suspected) exposure to other viral communicable diseases: Secondary | ICD-10-CM | POA: Diagnosis not present

## 2020-08-07 DIAGNOSIS — R2681 Unsteadiness on feet: Secondary | ICD-10-CM | POA: Diagnosis not present

## 2020-08-07 DIAGNOSIS — M2550 Pain in unspecified joint: Secondary | ICD-10-CM | POA: Diagnosis not present

## 2020-08-07 DIAGNOSIS — M6281 Muscle weakness (generalized): Secondary | ICD-10-CM | POA: Diagnosis not present

## 2020-08-08 DIAGNOSIS — R2681 Unsteadiness on feet: Secondary | ICD-10-CM | POA: Diagnosis not present

## 2020-08-08 DIAGNOSIS — M6281 Muscle weakness (generalized): Secondary | ICD-10-CM | POA: Diagnosis not present

## 2020-08-08 DIAGNOSIS — M2550 Pain in unspecified joint: Secondary | ICD-10-CM | POA: Diagnosis not present

## 2020-08-11 DIAGNOSIS — R2681 Unsteadiness on feet: Secondary | ICD-10-CM | POA: Diagnosis not present

## 2020-08-11 DIAGNOSIS — M2550 Pain in unspecified joint: Secondary | ICD-10-CM | POA: Diagnosis not present

## 2020-08-11 DIAGNOSIS — M6281 Muscle weakness (generalized): Secondary | ICD-10-CM | POA: Diagnosis not present

## 2020-08-12 ENCOUNTER — Telehealth: Payer: Medicare PPO | Admitting: Cardiology

## 2020-08-13 ENCOUNTER — Other Ambulatory Visit: Payer: Self-pay

## 2020-08-13 ENCOUNTER — Other Ambulatory Visit: Payer: Medicare PPO | Admitting: Internal Medicine

## 2020-08-13 DIAGNOSIS — M2550 Pain in unspecified joint: Secondary | ICD-10-CM | POA: Diagnosis not present

## 2020-08-13 DIAGNOSIS — Z20828 Contact with and (suspected) exposure to other viral communicable diseases: Secondary | ICD-10-CM | POA: Diagnosis not present

## 2020-08-13 DIAGNOSIS — R2681 Unsteadiness on feet: Secondary | ICD-10-CM | POA: Diagnosis not present

## 2020-08-13 DIAGNOSIS — M6281 Muscle weakness (generalized): Secondary | ICD-10-CM | POA: Diagnosis not present

## 2020-08-13 DIAGNOSIS — Z1159 Encounter for screening for other viral diseases: Secondary | ICD-10-CM | POA: Diagnosis not present

## 2020-08-13 DIAGNOSIS — Z515 Encounter for palliative care: Secondary | ICD-10-CM | POA: Diagnosis not present

## 2020-08-13 DIAGNOSIS — F0150 Vascular dementia without behavioral disturbance: Secondary | ICD-10-CM

## 2020-08-14 ENCOUNTER — Ambulatory Visit (INDEPENDENT_AMBULATORY_CARE_PROVIDER_SITE_OTHER): Payer: Medicare PPO | Admitting: *Deleted

## 2020-08-14 ENCOUNTER — Encounter: Payer: Self-pay | Admitting: Cardiology

## 2020-08-14 ENCOUNTER — Other Ambulatory Visit: Payer: Self-pay

## 2020-08-14 ENCOUNTER — Telehealth (INDEPENDENT_AMBULATORY_CARE_PROVIDER_SITE_OTHER): Payer: Medicare PPO | Admitting: Cardiology

## 2020-08-14 VITALS — BP 128/76 | HR 75

## 2020-08-14 DIAGNOSIS — Z8719 Personal history of other diseases of the digestive system: Secondary | ICD-10-CM | POA: Diagnosis not present

## 2020-08-14 DIAGNOSIS — I951 Orthostatic hypotension: Secondary | ICD-10-CM | POA: Diagnosis not present

## 2020-08-14 DIAGNOSIS — I6310 Cerebral infarction due to embolism of unspecified precerebral artery: Secondary | ICD-10-CM

## 2020-08-14 LAB — CUP PACEART REMOTE DEVICE CHECK
Date Time Interrogation Session: 20210902002201
Implantable Pulse Generator Implant Date: 20210322

## 2020-08-14 NOTE — Patient Instructions (Signed)
Medication Instructions:  Your physician recommends that you continue on your current medications as directed. Please refer to the Current Medication list given to you today.  *If you need a refill on your cardiac medications before your next appointment, please call your pharmacy*  Follow-Up: At [facility], you and your health needs are our priority.  As part of our continuing mission to provide you with exceptional heart care, we have created designated Provider Care Teams.  These Care Teams include your primary Cardiologist (physician) and Advanced Practice Providers (APPs -  Physician Assistants and Nurse Practitioners) who all work together to provide you with the care you need, when you need it.  We recommend signing up for the patient portal called "MyChart".  Sign up information is provided on this After Visit Summary.  MyChart is used to connect with patients for Virtual Visits (Telemedicine).  Patients are able to view lab/test results, encounter notes, upcoming appointments, etc.  Non-urgent messages can be sent to your provider as well.   To learn more about what you can do with MyChart, go to [email or web address].    Your next appointment:   6 month(s)  The format for your next appointment:   In Person  Provider:   You may see Traci Turner, MD or one of the following Advanced Practice Providers on your designated Care Team:    Dayna Dunn, PA-C  Michele Lenze, PA-C   

## 2020-08-14 NOTE — Progress Notes (Signed)
Virtual Visit via Telephone Note   This visit type was conducted due to national recommendations for restrictions regarding the COVID-19 Pandemic (e.g. social distancing) in an effort to limit this patient's exposure and mitigate transmission in our community.  Due to her co-morbid illnesses, this patient is at least at moderate risk for complications without adequate follow up.  This format is felt to be most appropriate for this patient at this time.  The patient did not have access to video technology/had technical difficulties with video requiring transitioning to audio format only (telephone).  All issues noted in this document were discussed and addressed.  No physical exam could be performed with this format.  Please refer to the patient's chart for her  consent to telehealth for [facility].   Evaluation Performed:  Follow-up visit  This visit type was conducted due to national recommendations for restrictions regarding the COVID-19 Pandemic (e.g. social distancing).  This format is felt to be most appropriate for this patient at this time.  All issues noted in this document were discussed and addressed.  No physical exam was performed (except for noted visual exam findings with Video Visits).  Please refer to the patient's chart (MyChart message for video visits and phone note for telephone visits) for the patient's consent to telehealth for [facility].  Date:  08/14/2020   ID:  Sheila Parrish, DOB 1927/11/13, MRN [id number]  Patient Location:  Home  Provider location:   [place]  PCP:  Sheila Orn, MD  Cardiologist:  Sheila Him, MD  Electrophysiologist:  None   Chief Complaint:  Orthostatic hypotension  Patient Profile: Sheila Parrish is a 86 y.o. female with:  Orthostatic hypotension  Hx of CVA  S/p ILR  Hyperlipidemia   Syncope   Prior CV Studies: Echocardiogram 05/03/2020 EF 65-70, normal wall motion, grade 1 diastolic dysfunction, normal RV SF, RVSP  37.8, mild LAE, mild MR, mild AI  Myoview 05/19/2017 EF 66, no ischemia or infarction, normal study  History of Present Illness:    Sheila Parrish is a 86 y.o. female who presents via audio/video conferencing for a telehealth visit today.    Sheila Parrish  had been admitted with syncope and orthostatic hypotension (BP dropped 146 >> 92 w standing).  She had no arrhythmias on ILR interrogation.  Her BP was in the 180s while lying.  Her midodrine was stopped due to supine HTN and was started on Pyridostigmine.  She was seen back in office by Sheila Dopp, PA and was doing well but her BP was low in the office.  It had been running 84-665'L systolic at home.  He encouraged her to push fluids and increase salt in her diet.    She is here today for followup and is doing well.  Her strength has improved with PT and Hospice nurse is working on balance.  She has not had any falls and is more alert. Her BP has been good and has not had any dizziness.  She is wearing the compression hose daily until she goes to bed and occasionally wears the abdominal binder. She has been eating well.  She denies any chest pain or pressure, SOB, DOE, PND, orthopnea, LE edema, dizziness, palpitations or syncope. She is compliant with her meds and is tolerating meds with no SE.  Her SBPs have been running 120-128mmHg lying, 115-135mmHg standing.    The patient does not have symptoms concerning for COVID-19 infection (fever, chills, cough, or new shortness of breath).  Prior CV studies:   The following studies were reviewed today:  none  Past Medical History:  Diagnosis Date  . High cholesterol   . Scarlet fever 1936   Hospitalized for a month  . Stroke (cerebrum) ([place])   . Syncope    Past Surgical History:  Procedure Laterality Date  . ABDOMINAL HYSTERECTOMY    . implantable loop recorder placement  03/03/2020   Medtronic Reveal Linq model [place] 11(SN [id number]) implanted for cryptogenic stroke  . TOTAL KNEE  ARTHROPLASTY Left 2010     Current Meds  Medication Sig  . acetaminophen (TYLENOL) 500 MG tablet Take 500 mg by mouth every 6 (six) hours as needed for headache (pain).  [name] Kitchen aspirin 325 MG tablet Take 1 tablet (325 mg total) by mouth daily.  . clopidogrel (PLAVIX) 75 MG tablet Take 1 tablet (75 mg total) by mouth daily.  . Menthol, Topical Analgesic, (MINERAL ICE EX) Apply 1 application topically as needed.   . pantoprazole (PROTONIX) 40 MG tablet Take 1 tablet (40 mg total) by mouth daily.  . polyethylene glycol (MIRALAX / GLYCOLAX) 17 g packet Take 17 g by mouth as needed for moderate constipation.  [name] Kitchen pyridostigmine (MESTINON) 60 MG tablet Take 0.5 tablets (30 mg total) by mouth every 12 (twelve) hours.     Allergies:   Donepezil, Alendronate, Atorvastatin, Bactrim [sulfamethoxazole-trimethoprim], Macrodantin [nitrofurantoin macrocrystal], Simvastatin, Sulfa antibiotics, and Azithromycin   Social History   Tobacco Use  . Smoking status: Never Smoker  . Smokeless tobacco: Never Used  Vaping Use  . Vaping Use: Never used  Substance Use Topics  . Alcohol use: No  . Drug use: No     Family Hx: The patient's family history includes Arthritis in her mother; Stroke (age of onset: 73) in her father. There is no history of CAD.  ROS:   Please see the history of present illness.     All other systems reviewed and are negative.   Labs/Other Tests and Data Reviewed:    Recent Labs: 05/04/2020: TSH 6.828 05/17/2020: ALT 10 05/19/2020: Hemoglobin 10.0; Platelets 383 05/20/2020: BUN 12; Creatinine, Ser 0.85; Potassium 3.5; Sodium 141   Recent Lipid Panel Lab Results  Component Value Date/Time   CHOL 214 (H) 02/29/2020 03:33 AM   TRIG 77 02/29/2020 03:33 AM   HDL 32 (L) 02/29/2020 03:33 AM   CHOLHDL 6.7 02/29/2020 03:33 AM   LDLCALC 167 (H) 02/29/2020 03:33 AM    Wt Readings from Last 3 Encounters:  06/06/20 114 lb (51.7 kg)  05/22/20 112 lb (50.8 kg)  05/04/20 126 lb 12.2 oz (57.5  kg)     Objective:    Vital Signs:  BP 128/76   Pulse 75     ASSESSMENT & PLAN:    1.  Orthostatic Hypotension -Her SBPs have been running 120-175mmHg lying, 115-197mmHg standing.   -she has not had any dizzy spells or syncope -continue to wear compression hose and abdominal binder -continue Pyridostigmine 30mg  BID  2.  Hx of GI bleed -continue PPI prophylaxis due to need to be on DAPT for her CVA  COVID-19 Education: The signs and symptoms of COVID-19 were discussed with the patient and how to seek care for testing (follow up with PCP or arrange E-visit).  The importance of social distancing was discussed today.  Patient Risk:   After full review of this patient's clinical status, I feel that they are at least moderate risk at this time.  Time:   Today, I have spent  20 minutes on telemedicine discussing medical problems including orthostatic hypotension, HTN, GI bleed and reviewing patient's chart including none.  Medication Adjustments/Labs and Tests Ordered: Current medicines are reviewed at length with the patient today.  Concerns regarding medicines are outlined above.  Tests Ordered: No orders of the defined types were placed in this encounter.  Medication Changes: No orders of the defined types were placed in this encounter.   Disposition:  Follow up in 6 month(s)  Signed, Sheila Him, MD  08/14/2020 10:22 AM    [facility]

## 2020-08-15 DIAGNOSIS — R2681 Unsteadiness on feet: Secondary | ICD-10-CM | POA: Diagnosis not present

## 2020-08-15 DIAGNOSIS — M6281 Muscle weakness (generalized): Secondary | ICD-10-CM | POA: Diagnosis not present

## 2020-08-15 DIAGNOSIS — M2550 Pain in unspecified joint: Secondary | ICD-10-CM | POA: Diagnosis not present

## 2020-08-15 NOTE — Progress Notes (Signed)
Carelink Summary Report / Loop Recorder 

## 2020-08-18 NOTE — Progress Notes (Signed)
[facility] Consult Note Telephone: [phone]  Fax: [phone]  PATIENT NAME: Sheila Parrish DOB: 24-Jan-1927 MRN: [id number]  PRIMARY CARE PROVIDER:   Lavone Orn, MD  REFERRING PROVIDER:  Lavone Orn, MD [facility] [employer] [address],  [place] [address]  RESPONSIBLE PARTYAntoine Poche Daughter/ POA [phone]  [phone]     RECOMMENDATIONS and PLAN:  Palliative care encounter   Z51.5  1.Advance care planning:   Goals of care remain unchanged and are for comfort measures and safety with the assistance of hired caregivers.  Her advanced directives are DNAR/DNI, and no tube feedings.  Palliative care will continue to f/u with patient in aprox 8 weeks.  2.  Vascular dementia without behavioral disturbances: At baseline. Improved mobility with supervision.  Advanced with FAST stage of 7c.  Continue supportive care.  Monitor cognitive and functional abilities.    3.  Dysphagia:  At baseline.  Continue aspiration precautions.  Comfort feedings.   4.  Alteration of skin integrity: Improved. Continue turning and repositioning every 2 hours. Monitor R hip mass for recurrent drainage.    5.  Constipation:  Resolved.  I spent 40 minutes providing this consultation,  From 1000  to 1040. More than 50% of the time in this consultation was spent coordinating communication with daughter, patient and caregiver.   HISTORY OF PRESENT ILLNESS:  Followup with  [name].  Daughter and caregiver report continuation of physical therapy and patient is more ambulatory with assistance.  No recent occurrences of  orthostatic hypotension. She is able to feed herself and continues to require total assistance with all ADLs.  Intermittent bloody drainage from R lateral hip mass(non-pulsating).   Palliative Care was asked to help address goals of care.   CODE STATUS: DNAR  PPS: 40% weak    HOSPICE ELIGIBILITY/DIAGNOSIS: TBD  PAST  MEDICAL HISTORY:  Past Medical History:  Diagnosis Date  . High cholesterol   . Scarlet fever 1936   Hospitalized for a month  . Stroke (cerebrum) ([place])   . Syncope     SOCIAL HX: Retired Pharmacist, hospital   ALLERGIES:  Allergies  Allergen Reactions  . Donepezil Nausea And Vomiting  . Alendronate Other (See Comments)    Difficulty swallowing  . Atorvastatin Other (See Comments)    Leg cramping  . Bactrim [Sulfamethoxazole-Trimethoprim] Nausea And Vomiting  . Macrodantin [Nitrofurantoin Macrocrystal] Other (See Comments)    Unknown reaction  . Simvastatin Other (See Comments)    Leg cramping  . Sulfa Antibiotics Nausea And Vomiting  . Azithromycin Rash     PERTINENT MEDICATIONS:  Outpatient Encounter Medications as of 06/18/2020  Medication Sig  . acetaminophen (TYLENOL) 500 MG tablet Take 500 mg by mouth every 6 (six) hours as needed for headache (pain).  [name] Kitchen aspirin 325 MG tablet Take 1 tablet (325 mg total) by mouth daily.  . clopidogrel (PLAVIX) 75 MG tablet Take 1 tablet (75 mg total) by mouth daily.  . Menthol, Topical Analgesic, (MINERAL ICE EX) Apply 1 application topically as needed.   . pantoprazole (PROTONIX) 40 MG tablet Take 1 tablet (40 mg total) by mouth daily.  . polyethylene glycol (MIRALAX / GLYCOLAX) 17 g packet Take 17 g by mouth daily. (Patient not taking: Reported on 06/06/2020)  . pyridostigmine (MESTINON) 60 MG tablet Take 0.5 tablets (30 mg total) by mouth every 12 (twelve) hours.   No facility-administered encounter medications on file as of 06/18/2020.  PHYSICAL EXAM:   General: NAD, frail and chronically appearing elderly female seated in chair. Cardiovascular: regular rate and rhythm Pulmonary: clear throughout Abdomen: soft, nontender, + bowel sounds Extremities: no edema Skin: Pale in color, raised purpura nodule of the R lateral thigh(smaller in size).  Neurological: Alert.  Unable to determine orientation due to cognitive deficit  Speaks occasionally  in 1 word response  Good eye contact. Weakness.                       Feeding self appropriately with use of a utensil  Gonzella Lex, NP-C

## 2020-08-19 DIAGNOSIS — R2681 Unsteadiness on feet: Secondary | ICD-10-CM | POA: Diagnosis not present

## 2020-08-19 DIAGNOSIS — M2550 Pain in unspecified joint: Secondary | ICD-10-CM | POA: Diagnosis not present

## 2020-08-19 DIAGNOSIS — M6281 Muscle weakness (generalized): Secondary | ICD-10-CM | POA: Diagnosis not present

## 2020-08-20 DIAGNOSIS — Z1159 Encounter for screening for other viral diseases: Secondary | ICD-10-CM | POA: Diagnosis not present

## 2020-08-20 DIAGNOSIS — Z20828 Contact with and (suspected) exposure to other viral communicable diseases: Secondary | ICD-10-CM | POA: Diagnosis not present

## 2020-08-21 DIAGNOSIS — M6281 Muscle weakness (generalized): Secondary | ICD-10-CM | POA: Diagnosis not present

## 2020-08-21 DIAGNOSIS — M2550 Pain in unspecified joint: Secondary | ICD-10-CM | POA: Diagnosis not present

## 2020-08-21 DIAGNOSIS — R2681 Unsteadiness on feet: Secondary | ICD-10-CM | POA: Diagnosis not present

## 2020-08-25 DIAGNOSIS — M6281 Muscle weakness (generalized): Secondary | ICD-10-CM | POA: Diagnosis not present

## 2020-08-25 DIAGNOSIS — M2550 Pain in unspecified joint: Secondary | ICD-10-CM | POA: Diagnosis not present

## 2020-08-25 DIAGNOSIS — R2681 Unsteadiness on feet: Secondary | ICD-10-CM | POA: Diagnosis not present

## 2020-08-26 DIAGNOSIS — R2681 Unsteadiness on feet: Secondary | ICD-10-CM | POA: Diagnosis not present

## 2020-08-26 DIAGNOSIS — M6281 Muscle weakness (generalized): Secondary | ICD-10-CM | POA: Diagnosis not present

## 2020-08-26 DIAGNOSIS — M2550 Pain in unspecified joint: Secondary | ICD-10-CM | POA: Diagnosis not present

## 2020-08-27 DIAGNOSIS — Z20828 Contact with and (suspected) exposure to other viral communicable diseases: Secondary | ICD-10-CM | POA: Diagnosis not present

## 2020-08-27 DIAGNOSIS — Z1159 Encounter for screening for other viral diseases: Secondary | ICD-10-CM | POA: Diagnosis not present

## 2020-08-28 DIAGNOSIS — M6281 Muscle weakness (generalized): Secondary | ICD-10-CM | POA: Diagnosis not present

## 2020-08-28 DIAGNOSIS — M2550 Pain in unspecified joint: Secondary | ICD-10-CM | POA: Diagnosis not present

## 2020-08-28 DIAGNOSIS — R2681 Unsteadiness on feet: Secondary | ICD-10-CM | POA: Diagnosis not present

## 2020-09-02 DIAGNOSIS — M6281 Muscle weakness (generalized): Secondary | ICD-10-CM | POA: Diagnosis not present

## 2020-09-02 DIAGNOSIS — R2681 Unsteadiness on feet: Secondary | ICD-10-CM | POA: Diagnosis not present

## 2020-09-02 DIAGNOSIS — Z20828 Contact with and (suspected) exposure to other viral communicable diseases: Secondary | ICD-10-CM | POA: Diagnosis not present

## 2020-09-02 DIAGNOSIS — M2550 Pain in unspecified joint: Secondary | ICD-10-CM | POA: Diagnosis not present

## 2020-09-02 DIAGNOSIS — Z1159 Encounter for screening for other viral diseases: Secondary | ICD-10-CM | POA: Diagnosis not present

## 2020-09-03 DIAGNOSIS — R2681 Unsteadiness on feet: Secondary | ICD-10-CM | POA: Diagnosis not present

## 2020-09-03 DIAGNOSIS — M6281 Muscle weakness (generalized): Secondary | ICD-10-CM | POA: Diagnosis not present

## 2020-09-03 DIAGNOSIS — M2550 Pain in unspecified joint: Secondary | ICD-10-CM | POA: Diagnosis not present

## 2020-09-05 DIAGNOSIS — Z1159 Encounter for screening for other viral diseases: Secondary | ICD-10-CM | POA: Diagnosis not present

## 2020-09-05 DIAGNOSIS — M2550 Pain in unspecified joint: Secondary | ICD-10-CM | POA: Diagnosis not present

## 2020-09-05 DIAGNOSIS — R2681 Unsteadiness on feet: Secondary | ICD-10-CM | POA: Diagnosis not present

## 2020-09-05 DIAGNOSIS — Z20828 Contact with and (suspected) exposure to other viral communicable diseases: Secondary | ICD-10-CM | POA: Diagnosis not present

## 2020-09-05 DIAGNOSIS — M6281 Muscle weakness (generalized): Secondary | ICD-10-CM | POA: Diagnosis not present

## 2020-09-09 DIAGNOSIS — M6281 Muscle weakness (generalized): Secondary | ICD-10-CM | POA: Diagnosis not present

## 2020-09-09 DIAGNOSIS — R2681 Unsteadiness on feet: Secondary | ICD-10-CM | POA: Diagnosis not present

## 2020-09-09 DIAGNOSIS — Z20828 Contact with and (suspected) exposure to other viral communicable diseases: Secondary | ICD-10-CM | POA: Diagnosis not present

## 2020-09-09 DIAGNOSIS — M2550 Pain in unspecified joint: Secondary | ICD-10-CM | POA: Diagnosis not present

## 2020-09-09 DIAGNOSIS — Z1159 Encounter for screening for other viral diseases: Secondary | ICD-10-CM | POA: Diagnosis not present

## 2020-09-10 DIAGNOSIS — M6281 Muscle weakness (generalized): Secondary | ICD-10-CM | POA: Diagnosis not present

## 2020-09-10 DIAGNOSIS — R2681 Unsteadiness on feet: Secondary | ICD-10-CM | POA: Diagnosis not present

## 2020-09-10 DIAGNOSIS — M2550 Pain in unspecified joint: Secondary | ICD-10-CM | POA: Diagnosis not present

## 2020-09-11 DIAGNOSIS — M2550 Pain in unspecified joint: Secondary | ICD-10-CM | POA: Diagnosis not present

## 2020-09-11 DIAGNOSIS — M6281 Muscle weakness (generalized): Secondary | ICD-10-CM | POA: Diagnosis not present

## 2020-09-11 DIAGNOSIS — R2681 Unsteadiness on feet: Secondary | ICD-10-CM | POA: Diagnosis not present

## 2020-09-12 ENCOUNTER — Other Ambulatory Visit: Payer: Self-pay | Admitting: Physician Assistant

## 2020-09-12 DIAGNOSIS — Z1159 Encounter for screening for other viral diseases: Secondary | ICD-10-CM | POA: Diagnosis not present

## 2020-09-12 DIAGNOSIS — Z20828 Contact with and (suspected) exposure to other viral communicable diseases: Secondary | ICD-10-CM | POA: Diagnosis not present

## 2020-09-16 ENCOUNTER — Ambulatory Visit (INDEPENDENT_AMBULATORY_CARE_PROVIDER_SITE_OTHER): Payer: Medicare PPO

## 2020-09-16 DIAGNOSIS — R55 Syncope and collapse: Secondary | ICD-10-CM

## 2020-09-16 DIAGNOSIS — R2681 Unsteadiness on feet: Secondary | ICD-10-CM | POA: Diagnosis not present

## 2020-09-16 DIAGNOSIS — M2550 Pain in unspecified joint: Secondary | ICD-10-CM | POA: Diagnosis not present

## 2020-09-16 DIAGNOSIS — Z20828 Contact with and (suspected) exposure to other viral communicable diseases: Secondary | ICD-10-CM | POA: Diagnosis not present

## 2020-09-16 DIAGNOSIS — Z1159 Encounter for screening for other viral diseases: Secondary | ICD-10-CM | POA: Diagnosis not present

## 2020-09-16 DIAGNOSIS — M6281 Muscle weakness (generalized): Secondary | ICD-10-CM | POA: Diagnosis not present

## 2020-09-16 LAB — CUP PACEART REMOTE DEVICE CHECK
Date Time Interrogation Session: 20211005002659
Implantable Pulse Generator Implant Date: 20210322

## 2020-09-17 DIAGNOSIS — M2550 Pain in unspecified joint: Secondary | ICD-10-CM | POA: Diagnosis not present

## 2020-09-17 DIAGNOSIS — M6281 Muscle weakness (generalized): Secondary | ICD-10-CM | POA: Diagnosis not present

## 2020-09-17 DIAGNOSIS — R2681 Unsteadiness on feet: Secondary | ICD-10-CM | POA: Diagnosis not present

## 2020-09-19 DIAGNOSIS — M2550 Pain in unspecified joint: Secondary | ICD-10-CM | POA: Diagnosis not present

## 2020-09-19 DIAGNOSIS — Z741 Need for assistance with personal care: Secondary | ICD-10-CM | POA: Diagnosis not present

## 2020-09-19 DIAGNOSIS — M6281 Muscle weakness (generalized): Secondary | ICD-10-CM | POA: Diagnosis not present

## 2020-09-19 DIAGNOSIS — Z20828 Contact with and (suspected) exposure to other viral communicable diseases: Secondary | ICD-10-CM | POA: Diagnosis not present

## 2020-09-19 DIAGNOSIS — R2681 Unsteadiness on feet: Secondary | ICD-10-CM | POA: Diagnosis not present

## 2020-09-19 DIAGNOSIS — Z1159 Encounter for screening for other viral diseases: Secondary | ICD-10-CM | POA: Diagnosis not present

## 2020-09-19 NOTE — Progress Notes (Signed)
Carelink Summary Report / Loop Recorder 

## 2020-09-23 DIAGNOSIS — Z20828 Contact with and (suspected) exposure to other viral communicable diseases: Secondary | ICD-10-CM | POA: Diagnosis not present

## 2020-09-23 DIAGNOSIS — Z1159 Encounter for screening for other viral diseases: Secondary | ICD-10-CM | POA: Diagnosis not present

## 2020-09-23 DIAGNOSIS — M6281 Muscle weakness (generalized): Secondary | ICD-10-CM | POA: Diagnosis not present

## 2020-09-23 DIAGNOSIS — R2681 Unsteadiness on feet: Secondary | ICD-10-CM | POA: Diagnosis not present

## 2020-09-23 DIAGNOSIS — M2550 Pain in unspecified joint: Secondary | ICD-10-CM | POA: Diagnosis not present

## 2020-09-26 DIAGNOSIS — Z1159 Encounter for screening for other viral diseases: Secondary | ICD-10-CM | POA: Diagnosis not present

## 2020-09-26 DIAGNOSIS — Z20828 Contact with and (suspected) exposure to other viral communicable diseases: Secondary | ICD-10-CM | POA: Diagnosis not present

## 2020-09-30 DIAGNOSIS — Z20828 Contact with and (suspected) exposure to other viral communicable diseases: Secondary | ICD-10-CM | POA: Diagnosis not present

## 2020-09-30 DIAGNOSIS — Z1159 Encounter for screening for other viral diseases: Secondary | ICD-10-CM | POA: Diagnosis not present

## 2020-10-02 DIAGNOSIS — M6281 Muscle weakness (generalized): Secondary | ICD-10-CM | POA: Diagnosis not present

## 2020-10-02 DIAGNOSIS — Z741 Need for assistance with personal care: Secondary | ICD-10-CM | POA: Diagnosis not present

## 2020-10-03 DIAGNOSIS — Z1159 Encounter for screening for other viral diseases: Secondary | ICD-10-CM | POA: Diagnosis not present

## 2020-10-03 DIAGNOSIS — Z20828 Contact with and (suspected) exposure to other viral communicable diseases: Secondary | ICD-10-CM | POA: Diagnosis not present

## 2020-10-06 DIAGNOSIS — Z741 Need for assistance with personal care: Secondary | ICD-10-CM | POA: Diagnosis not present

## 2020-10-06 DIAGNOSIS — M6281 Muscle weakness (generalized): Secondary | ICD-10-CM | POA: Diagnosis not present

## 2020-10-07 DIAGNOSIS — Z1159 Encounter for screening for other viral diseases: Secondary | ICD-10-CM | POA: Diagnosis not present

## 2020-10-07 DIAGNOSIS — Z20828 Contact with and (suspected) exposure to other viral communicable diseases: Secondary | ICD-10-CM | POA: Diagnosis not present

## 2020-10-08 DIAGNOSIS — M6281 Muscle weakness (generalized): Secondary | ICD-10-CM | POA: Diagnosis not present

## 2020-10-08 DIAGNOSIS — Z741 Need for assistance with personal care: Secondary | ICD-10-CM | POA: Diagnosis not present

## 2020-10-10 DIAGNOSIS — Z1159 Encounter for screening for other viral diseases: Secondary | ICD-10-CM | POA: Diagnosis not present

## 2020-10-10 DIAGNOSIS — M810 Age-related osteoporosis without current pathological fracture: Secondary | ICD-10-CM | POA: Diagnosis not present

## 2020-10-10 DIAGNOSIS — F0390 Unspecified dementia without behavioral disturbance: Secondary | ICD-10-CM | POA: Diagnosis not present

## 2020-10-10 DIAGNOSIS — Z8673 Personal history of transient ischemic attack (TIA), and cerebral infarction without residual deficits: Secondary | ICD-10-CM | POA: Diagnosis not present

## 2020-10-10 DIAGNOSIS — L989 Disorder of the skin and subcutaneous tissue, unspecified: Secondary | ICD-10-CM | POA: Diagnosis not present

## 2020-10-10 DIAGNOSIS — I951 Orthostatic hypotension: Secondary | ICD-10-CM | POA: Diagnosis not present

## 2020-10-10 DIAGNOSIS — Z20828 Contact with and (suspected) exposure to other viral communicable diseases: Secondary | ICD-10-CM | POA: Diagnosis not present

## 2020-10-14 DIAGNOSIS — Z1159 Encounter for screening for other viral diseases: Secondary | ICD-10-CM | POA: Diagnosis not present

## 2020-10-14 DIAGNOSIS — Z20828 Contact with and (suspected) exposure to other viral communicable diseases: Secondary | ICD-10-CM | POA: Diagnosis not present

## 2020-10-14 DIAGNOSIS — M6281 Muscle weakness (generalized): Secondary | ICD-10-CM | POA: Diagnosis not present

## 2020-10-14 DIAGNOSIS — Z741 Need for assistance with personal care: Secondary | ICD-10-CM | POA: Diagnosis not present

## 2020-10-16 DIAGNOSIS — Z741 Need for assistance with personal care: Secondary | ICD-10-CM | POA: Diagnosis not present

## 2020-10-16 DIAGNOSIS — M6281 Muscle weakness (generalized): Secondary | ICD-10-CM | POA: Diagnosis not present

## 2020-10-17 DIAGNOSIS — Z20828 Contact with and (suspected) exposure to other viral communicable diseases: Secondary | ICD-10-CM | POA: Diagnosis not present

## 2020-10-17 DIAGNOSIS — Z1159 Encounter for screening for other viral diseases: Secondary | ICD-10-CM | POA: Diagnosis not present

## 2020-10-19 LAB — CUP PACEART REMOTE DEVICE CHECK
Date Time Interrogation Session: 20211107005040
Implantable Pulse Generator Implant Date: 20210322

## 2020-10-20 ENCOUNTER — Ambulatory Visit (INDEPENDENT_AMBULATORY_CARE_PROVIDER_SITE_OTHER): Payer: Medicare PPO

## 2020-10-20 DIAGNOSIS — I6310 Cerebral infarction due to embolism of unspecified precerebral artery: Secondary | ICD-10-CM | POA: Diagnosis not present

## 2020-10-20 NOTE — Progress Notes (Signed)
Carelink Summary Report / Loop Recorder 

## 2020-10-21 DIAGNOSIS — Z1159 Encounter for screening for other viral diseases: Secondary | ICD-10-CM | POA: Diagnosis not present

## 2020-10-21 DIAGNOSIS — Z20828 Contact with and (suspected) exposure to other viral communicable diseases: Secondary | ICD-10-CM | POA: Diagnosis not present

## 2020-10-22 DIAGNOSIS — M6281 Muscle weakness (generalized): Secondary | ICD-10-CM | POA: Diagnosis not present

## 2020-10-22 DIAGNOSIS — Z741 Need for assistance with personal care: Secondary | ICD-10-CM | POA: Diagnosis not present

## 2020-10-24 DIAGNOSIS — Z741 Need for assistance with personal care: Secondary | ICD-10-CM | POA: Diagnosis not present

## 2020-10-24 DIAGNOSIS — M6281 Muscle weakness (generalized): Secondary | ICD-10-CM | POA: Diagnosis not present

## 2020-10-24 DIAGNOSIS — Z1159 Encounter for screening for other viral diseases: Secondary | ICD-10-CM | POA: Diagnosis not present

## 2020-10-24 DIAGNOSIS — Z20828 Contact with and (suspected) exposure to other viral communicable diseases: Secondary | ICD-10-CM | POA: Diagnosis not present

## 2020-10-28 DIAGNOSIS — M6281 Muscle weakness (generalized): Secondary | ICD-10-CM | POA: Diagnosis not present

## 2020-10-28 DIAGNOSIS — Z741 Need for assistance with personal care: Secondary | ICD-10-CM | POA: Diagnosis not present

## 2020-10-31 DIAGNOSIS — Z1159 Encounter for screening for other viral diseases: Secondary | ICD-10-CM | POA: Diagnosis not present

## 2020-10-31 DIAGNOSIS — Z741 Need for assistance with personal care: Secondary | ICD-10-CM | POA: Diagnosis not present

## 2020-10-31 DIAGNOSIS — M6281 Muscle weakness (generalized): Secondary | ICD-10-CM | POA: Diagnosis not present

## 2020-10-31 DIAGNOSIS — Z20828 Contact with and (suspected) exposure to other viral communicable diseases: Secondary | ICD-10-CM | POA: Diagnosis not present

## 2020-11-03 DIAGNOSIS — M6281 Muscle weakness (generalized): Secondary | ICD-10-CM | POA: Diagnosis not present

## 2020-11-03 DIAGNOSIS — Z741 Need for assistance with personal care: Secondary | ICD-10-CM | POA: Diagnosis not present

## 2020-11-04 DIAGNOSIS — Z1159 Encounter for screening for other viral diseases: Secondary | ICD-10-CM | POA: Diagnosis not present

## 2020-11-04 DIAGNOSIS — Z20828 Contact with and (suspected) exposure to other viral communicable diseases: Secondary | ICD-10-CM | POA: Diagnosis not present

## 2020-11-05 DIAGNOSIS — M6281 Muscle weakness (generalized): Secondary | ICD-10-CM | POA: Diagnosis not present

## 2020-11-05 DIAGNOSIS — Z741 Need for assistance with personal care: Secondary | ICD-10-CM | POA: Diagnosis not present

## 2020-11-10 DIAGNOSIS — M6281 Muscle weakness (generalized): Secondary | ICD-10-CM | POA: Diagnosis not present

## 2020-11-10 DIAGNOSIS — Z741 Need for assistance with personal care: Secondary | ICD-10-CM | POA: Diagnosis not present

## 2020-11-13 DIAGNOSIS — Z741 Need for assistance with personal care: Secondary | ICD-10-CM | POA: Diagnosis not present

## 2020-11-13 DIAGNOSIS — M6281 Muscle weakness (generalized): Secondary | ICD-10-CM | POA: Diagnosis not present

## 2020-11-18 DIAGNOSIS — M6281 Muscle weakness (generalized): Secondary | ICD-10-CM | POA: Diagnosis not present

## 2020-11-18 DIAGNOSIS — Z1159 Encounter for screening for other viral diseases: Secondary | ICD-10-CM | POA: Diagnosis not present

## 2020-11-18 DIAGNOSIS — Z741 Need for assistance with personal care: Secondary | ICD-10-CM | POA: Diagnosis not present

## 2020-11-18 DIAGNOSIS — Z20828 Contact with and (suspected) exposure to other viral communicable diseases: Secondary | ICD-10-CM | POA: Diagnosis not present

## 2020-11-19 DIAGNOSIS — M6281 Muscle weakness (generalized): Secondary | ICD-10-CM | POA: Diagnosis not present

## 2020-11-19 DIAGNOSIS — Z741 Need for assistance with personal care: Secondary | ICD-10-CM | POA: Diagnosis not present

## 2020-11-20 DIAGNOSIS — M810 Age-related osteoporosis without current pathological fracture: Secondary | ICD-10-CM | POA: Diagnosis not present

## 2020-11-21 DIAGNOSIS — Z1159 Encounter for screening for other viral diseases: Secondary | ICD-10-CM | POA: Diagnosis not present

## 2020-11-21 DIAGNOSIS — M6281 Muscle weakness (generalized): Secondary | ICD-10-CM | POA: Diagnosis not present

## 2020-11-21 DIAGNOSIS — Z741 Need for assistance with personal care: Secondary | ICD-10-CM | POA: Diagnosis not present

## 2020-11-21 DIAGNOSIS — Z20828 Contact with and (suspected) exposure to other viral communicable diseases: Secondary | ICD-10-CM | POA: Diagnosis not present

## 2020-11-22 LAB — CUP PACEART REMOTE DEVICE CHECK
Date Time Interrogation Session: 20211210014505
Implantable Pulse Generator Implant Date: 20210322

## 2020-11-24 ENCOUNTER — Ambulatory Visit (INDEPENDENT_AMBULATORY_CARE_PROVIDER_SITE_OTHER): Payer: Medicare PPO

## 2020-11-24 DIAGNOSIS — Z741 Need for assistance with personal care: Secondary | ICD-10-CM | POA: Diagnosis not present

## 2020-11-24 DIAGNOSIS — R55 Syncope and collapse: Secondary | ICD-10-CM | POA: Diagnosis not present

## 2020-11-24 DIAGNOSIS — M6281 Muscle weakness (generalized): Secondary | ICD-10-CM | POA: Diagnosis not present

## 2020-11-25 DIAGNOSIS — Z20828 Contact with and (suspected) exposure to other viral communicable diseases: Secondary | ICD-10-CM | POA: Diagnosis not present

## 2020-11-25 DIAGNOSIS — Z741 Need for assistance with personal care: Secondary | ICD-10-CM | POA: Diagnosis not present

## 2020-11-25 DIAGNOSIS — Z1159 Encounter for screening for other viral diseases: Secondary | ICD-10-CM | POA: Diagnosis not present

## 2020-11-25 DIAGNOSIS — M6281 Muscle weakness (generalized): Secondary | ICD-10-CM | POA: Diagnosis not present

## 2020-11-26 DIAGNOSIS — Z741 Need for assistance with personal care: Secondary | ICD-10-CM | POA: Diagnosis not present

## 2020-11-26 DIAGNOSIS — M6281 Muscle weakness (generalized): Secondary | ICD-10-CM | POA: Diagnosis not present

## 2020-11-28 DIAGNOSIS — Z1159 Encounter for screening for other viral diseases: Secondary | ICD-10-CM | POA: Diagnosis not present

## 2020-11-28 DIAGNOSIS — Z20828 Contact with and (suspected) exposure to other viral communicable diseases: Secondary | ICD-10-CM | POA: Diagnosis not present

## 2020-12-02 DIAGNOSIS — Z741 Need for assistance with personal care: Secondary | ICD-10-CM | POA: Diagnosis not present

## 2020-12-02 DIAGNOSIS — M6281 Muscle weakness (generalized): Secondary | ICD-10-CM | POA: Diagnosis not present

## 2020-12-02 DIAGNOSIS — Z20828 Contact with and (suspected) exposure to other viral communicable diseases: Secondary | ICD-10-CM | POA: Diagnosis not present

## 2020-12-02 DIAGNOSIS — Z1159 Encounter for screening for other viral diseases: Secondary | ICD-10-CM | POA: Diagnosis not present

## 2020-12-03 DIAGNOSIS — Z741 Need for assistance with personal care: Secondary | ICD-10-CM | POA: Diagnosis not present

## 2020-12-03 DIAGNOSIS — M6281 Muscle weakness (generalized): Secondary | ICD-10-CM | POA: Diagnosis not present

## 2020-12-04 DIAGNOSIS — M6281 Muscle weakness (generalized): Secondary | ICD-10-CM | POA: Diagnosis not present

## 2020-12-04 DIAGNOSIS — Z741 Need for assistance with personal care: Secondary | ICD-10-CM | POA: Diagnosis not present

## 2020-12-08 DIAGNOSIS — R1312 Dysphagia, oropharyngeal phase: Secondary | ICD-10-CM | POA: Diagnosis not present

## 2020-12-08 DIAGNOSIS — I69328 Other speech and language deficits following cerebral infarction: Secondary | ICD-10-CM | POA: Diagnosis not present

## 2020-12-09 DIAGNOSIS — Z1159 Encounter for screening for other viral diseases: Secondary | ICD-10-CM | POA: Diagnosis not present

## 2020-12-09 DIAGNOSIS — Z20828 Contact with and (suspected) exposure to other viral communicable diseases: Secondary | ICD-10-CM | POA: Diagnosis not present

## 2020-12-09 NOTE — Progress Notes (Signed)
Carelink Summary Report / Loop Recorder 

## 2020-12-10 ENCOUNTER — Other Ambulatory Visit: Payer: Medicare PPO | Admitting: Internal Medicine

## 2020-12-10 ENCOUNTER — Other Ambulatory Visit: Payer: Self-pay

## 2020-12-10 DIAGNOSIS — Z7189 Other specified counseling: Secondary | ICD-10-CM | POA: Diagnosis not present

## 2020-12-10 DIAGNOSIS — L03031 Cellulitis of right toe: Secondary | ICD-10-CM | POA: Diagnosis not present

## 2020-12-10 DIAGNOSIS — F0150 Vascular dementia without behavioral disturbance: Secondary | ICD-10-CM | POA: Diagnosis not present

## 2020-12-10 DIAGNOSIS — I951 Orthostatic hypotension: Secondary | ICD-10-CM

## 2020-12-10 DIAGNOSIS — Z515 Encounter for palliative care: Secondary | ICD-10-CM | POA: Diagnosis not present

## 2020-12-10 NOTE — Progress Notes (Signed)
Sheila Parrish Note Telephone: [phone]  Fax: [phone]  PATIENT NAME: Sheila Parrish DOB: Sep 22, 1927 MRN: [id number]  PRIMARY CARE PROVIDER:   Lavone Orn, MD  REFERRING PROVIDER:  Lavone Orn, MD [place] [address] [employer] [address] [place],  Sheila Parrish [address]  RESPONSIBLE PARTYAntoine Parrish Daughter/ POA [phone]  [phone]     RECOMMENDATIONS and PLAN:  Palliative care encounter   Z51.5  1.Advance care planning:   Goals of care remain unchanged and are for avoidance of recurrent hospitalization and longevity in [place] with the assistance of hired caregivers and family.  Advanced directives re-addressed.  MOST form completed with delegations of DNAR, limited additional interventions, antibiotics and IV therapy if indicatedbut no tube feedings.  MOST form was signed, uploaded to [facility] and left with daughter for display in pt.s apartment.  Palliative care will continue to f/u with patient in aprox 8 weeks.  2.  Vascular dementia without behavioral disturbances: At baseline.  Advanced with FAST stage of 7c. Ambulatory with assistance only.  Continue supportive care and cueing.  Monitor cognitive and functional abilities.    3.  Orthostatic hypotension:  Recurrent and resolved without treatment.  No plans for escalation of therapy.    3.  Dysphagia:  At baseline.  Continue aspiration precautions.  Comfort feedings.   4. Cellulitis:  New occurrence of R 2nd toe. Partially improved. Extend Keflex for 2 more days(total of 7 days). Yogart daily. Cleanse with soap and water. Continue to monitor.  Daughter chose to message PCP to forward above recommendations.  I spent 40 minutes providing this consultation,  From 1100  to 1140. More than 50% of the time in this consultation was spent coordinating communication with daughter, patient and caregiver.   HISTORY OF PRESENT ILLNESS:  Followup with  Sheila Parrish.   Daughter and caregiver report patient is still able to feed self and ambulate with her walker.  She needs cueing and encouragement for completion of all activities.  One recent occurrence of  orthostatic hypotension on 12/25 that resolved resolved with rest while reclining. Reports of antibiotic therapy due to a wound of her R 2nd toe.  Unknown cause of wound   Palliative Care was asked to help address goals of care.   CODE STATUS: DNAR/DNI  PPS: 40% weak    HOSPICE ELIGIBILITY/DIAGNOSIS: TBD  PAST MEDICAL HISTORY:  Past Medical History:  Diagnosis Date  . High cholesterol   . Scarlet fever 1936   Hospitalized for a month  . Stroke (cerebrum) ([place])   . Syncope     SOCIAL HX: Retired Pharmacist, hospital   ALLERGIES:  Allergies  Allergen Reactions  . Donepezil Nausea And Vomiting  . Alendronate Other (See Comments)    Difficulty swallowing  . Atorvastatin Other (See Comments)    Leg cramping  . Bactrim [Sulfamethoxazole-Trimethoprim] Nausea And Vomiting  . Macrodantin [Nitrofurantoin Macrocrystal] Other (See Comments)    Unknown reaction  . Simvastatin Other (See Comments)    Leg cramping  . Sulfa Antibiotics Nausea And Vomiting  . Azithromycin Rash     PERTINENT MEDICATIONS:  Outpatient Encounter Medications as of 06/18/2020  Medication Sig  . acetaminophen (TYLENOL) 500 MG tablet Take 500 mg by mouth every 6 (six) hours as needed for headache (pain).  [name] Kitchen aspirin 325 MG tablet Take 1 tablet (325 mg total) by mouth daily.  . clopidogrel (PLAVIX) 75 MG tablet Take 1 tablet (75 mg total) by  mouth daily.  . Menthol, Topical Analgesic, (MINERAL ICE EX) Apply 1 application topically as needed.   . pantoprazole (PROTONIX) 40 MG tablet Take 1 tablet (40 mg total) by mouth daily.  . polyethylene glycol (MIRALAX / GLYCOLAX) 17 g packet Take 17 g by mouth daily. (Patient not taking: Reported on 06/06/2020)  . pyridostigmine (MESTINON) 60 MG tablet Take 0.5 tablets (30 mg total) by mouth every 12  (twelve) hours.   No facility-administered encounter medications on file as of 06/18/2020.    PHYSICAL EXAM:   General: NAD, frail and chronically appearing elderly female seated in transport chair. Cardiovascular: regular rate and rhythm Pulmonary: clear throughout.  Unlabored respirations Abdomen: soft, nontender, + bowel sounds Extremities: no edema Skin: Pale in color.  Soft eschar with faint erythema of distal superior 2nd toe. No increased warmth Neurological: Alert.  Unable to determine orientation due to cognitive deficit  Speaks occasionally in 1 word response Fair eye contact. Generalized weakness.   Feeding self appropriately with use of a utensil with cueing.  Margaretha Sheffield, NP-C

## 2020-12-12 DIAGNOSIS — Z1159 Encounter for screening for other viral diseases: Secondary | ICD-10-CM | POA: Diagnosis not present

## 2020-12-12 DIAGNOSIS — R1312 Dysphagia, oropharyngeal phase: Secondary | ICD-10-CM | POA: Diagnosis not present

## 2020-12-12 DIAGNOSIS — I69328 Other speech and language deficits following cerebral infarction: Secondary | ICD-10-CM | POA: Diagnosis not present

## 2020-12-12 DIAGNOSIS — Z20828 Contact with and (suspected) exposure to other viral communicable diseases: Secondary | ICD-10-CM | POA: Diagnosis not present

## 2020-12-16 DIAGNOSIS — Z1159 Encounter for screening for other viral diseases: Secondary | ICD-10-CM | POA: Diagnosis not present

## 2020-12-16 DIAGNOSIS — Z20828 Contact with and (suspected) exposure to other viral communicable diseases: Secondary | ICD-10-CM | POA: Diagnosis not present

## 2020-12-17 DIAGNOSIS — R1312 Dysphagia, oropharyngeal phase: Secondary | ICD-10-CM | POA: Diagnosis not present

## 2020-12-17 DIAGNOSIS — I69328 Other speech and language deficits following cerebral infarction: Secondary | ICD-10-CM | POA: Diagnosis not present

## 2020-12-19 DIAGNOSIS — Z1159 Encounter for screening for other viral diseases: Secondary | ICD-10-CM | POA: Diagnosis not present

## 2020-12-19 DIAGNOSIS — R1312 Dysphagia, oropharyngeal phase: Secondary | ICD-10-CM | POA: Diagnosis not present

## 2020-12-19 DIAGNOSIS — I69328 Other speech and language deficits following cerebral infarction: Secondary | ICD-10-CM | POA: Diagnosis not present

## 2020-12-19 DIAGNOSIS — Z20828 Contact with and (suspected) exposure to other viral communicable diseases: Secondary | ICD-10-CM | POA: Diagnosis not present

## 2020-12-22 DIAGNOSIS — R1312 Dysphagia, oropharyngeal phase: Secondary | ICD-10-CM | POA: Diagnosis not present

## 2020-12-22 DIAGNOSIS — I69328 Other speech and language deficits following cerebral infarction: Secondary | ICD-10-CM | POA: Diagnosis not present

## 2020-12-24 DIAGNOSIS — R1312 Dysphagia, oropharyngeal phase: Secondary | ICD-10-CM | POA: Diagnosis not present

## 2020-12-24 DIAGNOSIS — I69328 Other speech and language deficits following cerebral infarction: Secondary | ICD-10-CM | POA: Diagnosis not present

## 2020-12-26 DIAGNOSIS — R1312 Dysphagia, oropharyngeal phase: Secondary | ICD-10-CM | POA: Diagnosis not present

## 2020-12-26 DIAGNOSIS — I69328 Other speech and language deficits following cerebral infarction: Secondary | ICD-10-CM | POA: Diagnosis not present

## 2020-12-29 ENCOUNTER — Ambulatory Visit (INDEPENDENT_AMBULATORY_CARE_PROVIDER_SITE_OTHER): Payer: Medicare PPO

## 2020-12-29 DIAGNOSIS — R55 Syncope and collapse: Secondary | ICD-10-CM | POA: Diagnosis not present

## 2020-12-30 DIAGNOSIS — Z20828 Contact with and (suspected) exposure to other viral communicable diseases: Secondary | ICD-10-CM | POA: Diagnosis not present

## 2020-12-30 DIAGNOSIS — I69328 Other speech and language deficits following cerebral infarction: Secondary | ICD-10-CM | POA: Diagnosis not present

## 2020-12-30 DIAGNOSIS — R1312 Dysphagia, oropharyngeal phase: Secondary | ICD-10-CM | POA: Diagnosis not present

## 2020-12-30 LAB — CUP PACEART REMOTE DEVICE CHECK
Date Time Interrogation Session: 20220112030132
Implantable Pulse Generator Implant Date: 20210322

## 2021-01-01 DIAGNOSIS — Z20828 Contact with and (suspected) exposure to other viral communicable diseases: Secondary | ICD-10-CM | POA: Diagnosis not present

## 2021-01-01 DIAGNOSIS — I69328 Other speech and language deficits following cerebral infarction: Secondary | ICD-10-CM | POA: Diagnosis not present

## 2021-01-01 DIAGNOSIS — R1312 Dysphagia, oropharyngeal phase: Secondary | ICD-10-CM | POA: Diagnosis not present

## 2021-01-02 DIAGNOSIS — I69328 Other speech and language deficits following cerebral infarction: Secondary | ICD-10-CM | POA: Diagnosis not present

## 2021-01-02 DIAGNOSIS — R1312 Dysphagia, oropharyngeal phase: Secondary | ICD-10-CM | POA: Diagnosis not present

## 2021-01-05 DIAGNOSIS — I69328 Other speech and language deficits following cerebral infarction: Secondary | ICD-10-CM | POA: Diagnosis not present

## 2021-01-05 DIAGNOSIS — Z20828 Contact with and (suspected) exposure to other viral communicable diseases: Secondary | ICD-10-CM | POA: Diagnosis not present

## 2021-01-05 DIAGNOSIS — R1312 Dysphagia, oropharyngeal phase: Secondary | ICD-10-CM | POA: Diagnosis not present

## 2021-01-06 IMAGING — DX DG PELVIS 1-2V
1 series · 1 of 1 positions shown · non-contrast
Comparison: None.

CLINICAL DATA: Pain following fall

EXAM:
PELVIS - 1-2 VIEW

[pelvis ap]
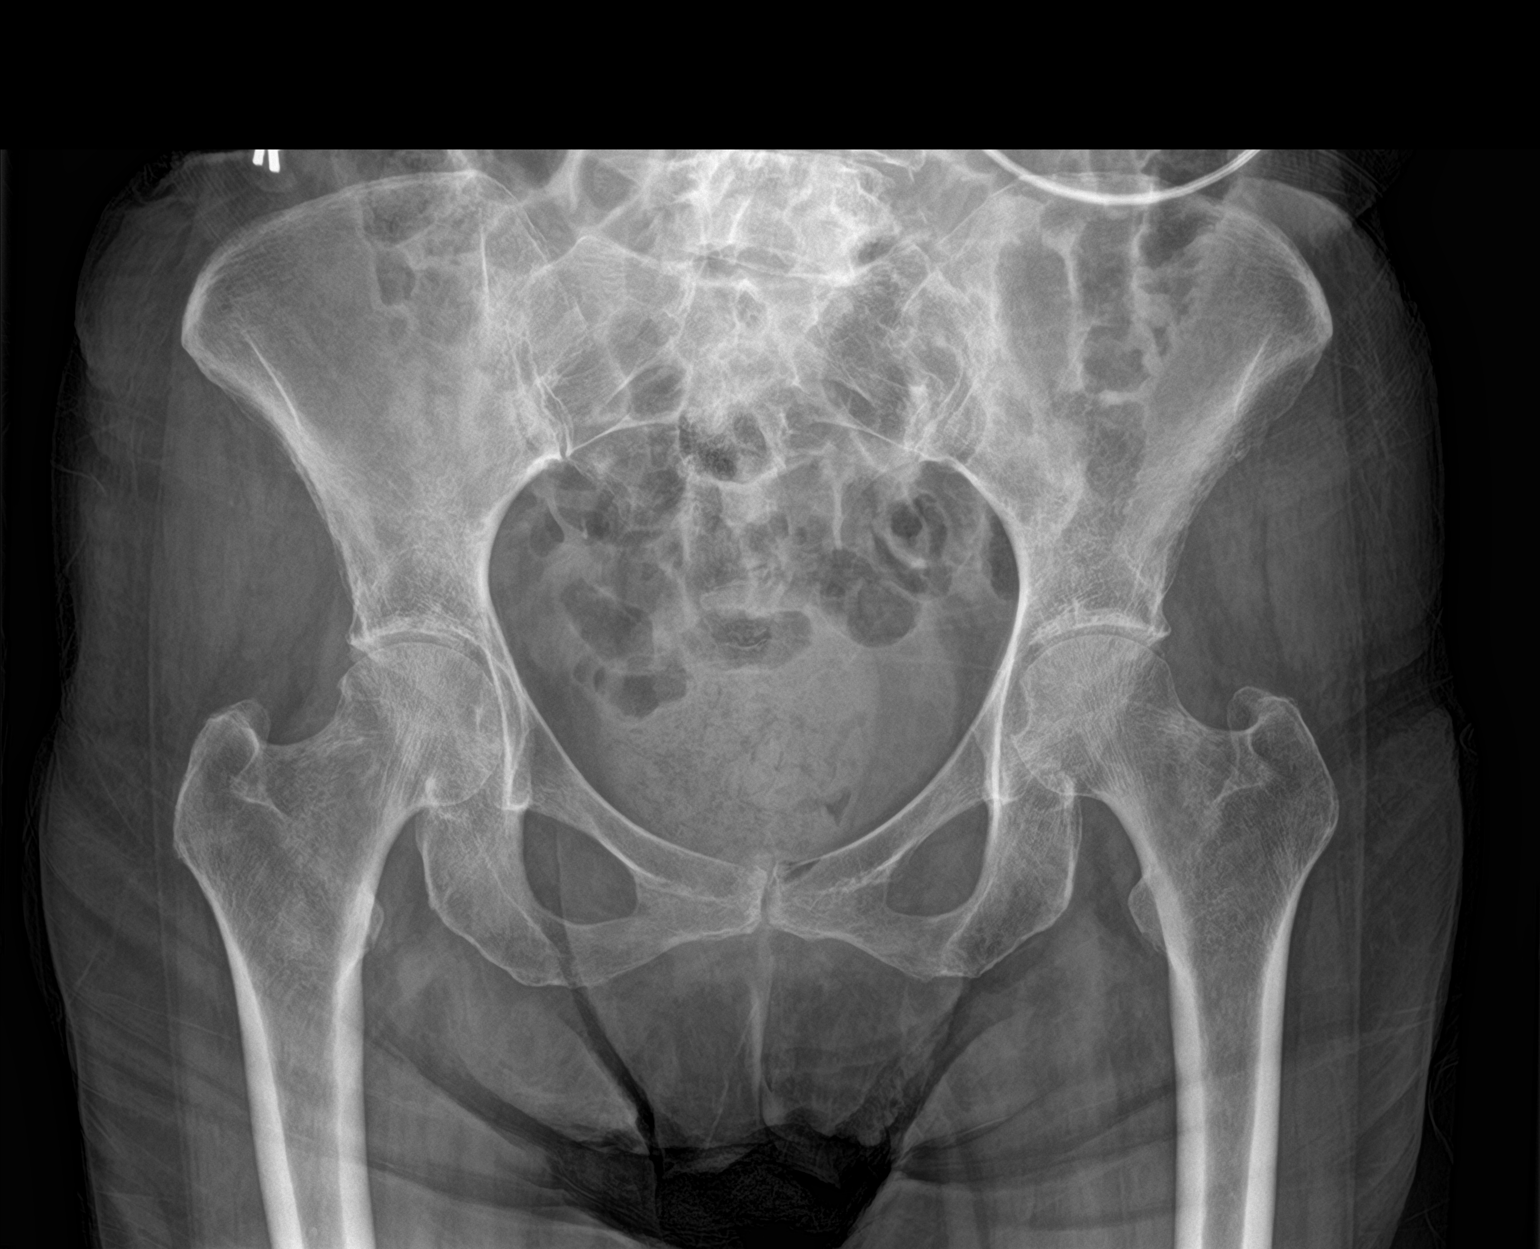

[1 of 1 positions shown; findings below may reference images not displayed]

FINDINGS: There is no evidence of pelvic fracture or dislocation. There is
moderate symmetric narrowing of each hip joint. No erosive change.
Bones appear somewhat osteoporotic. There is degenerative change in
the lower lumbar spine with lower lumbar levoscoliosis.
IMPRESSION: Symmetric narrowing of each hip joint. Scoliosis and degenerative
change in lower lumbar spine. Evidence of a degree of osteoporosis.
No fracture or dislocation.

## 2021-01-06 IMAGING — CT CT CERVICAL SPINE W/O CM
3 of 4 series · 12 of 33 positions shown, 14 images · non-contrast
Comparison: 04/13/2020

CLINICAL DATA: Fell out of a chair with trauma to the head and
neck.

EXAM:
CT HEAD WITHOUT CONTRAST
CT CERVICAL SPINE WITHOUT CONTRAST
TECHNIQUE: Multidetector CT imaging of the head and cervical spine was
performed following the standard protocol without intravenous
contrast. Multiplanar CT image reconstructions of the cervical spine
were also generated.

[Series 4: c_spine 2.0 st · axial · 0.30mm/px · z∈[-184,-76]mm · 4 of 82 slices shown, 5 images]
[im 14/82  soft-tissue]
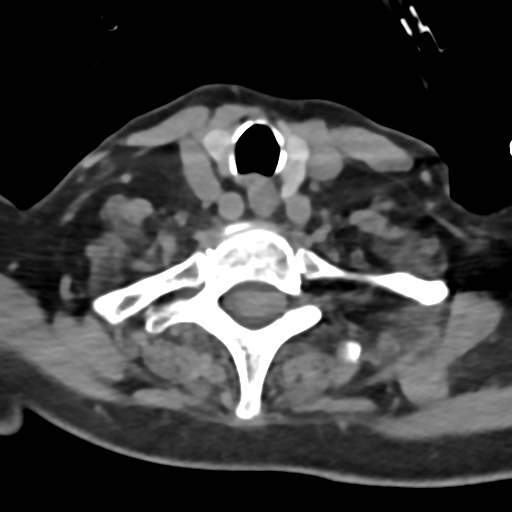
[im 14/82  bone]
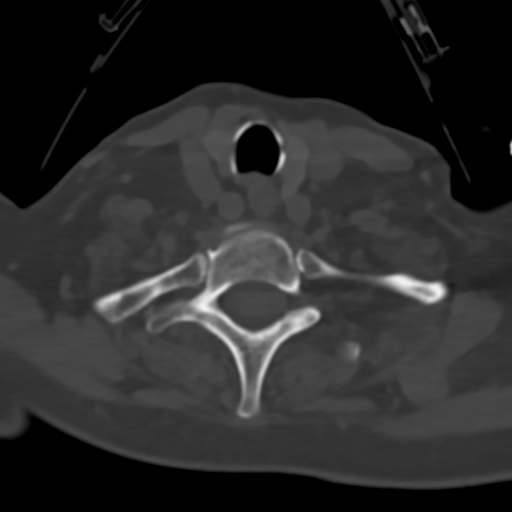
[im 28/82  bone]
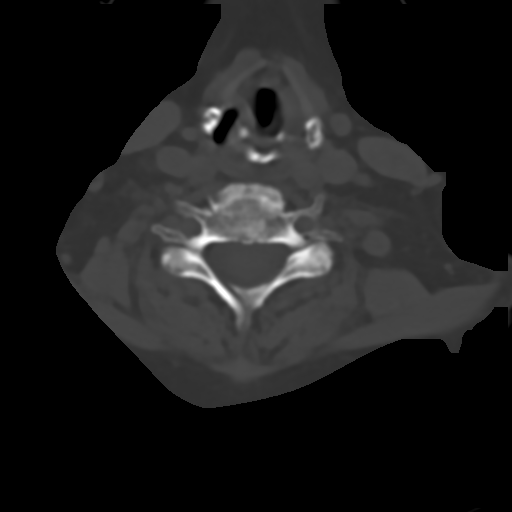
[im 55/82  bone]
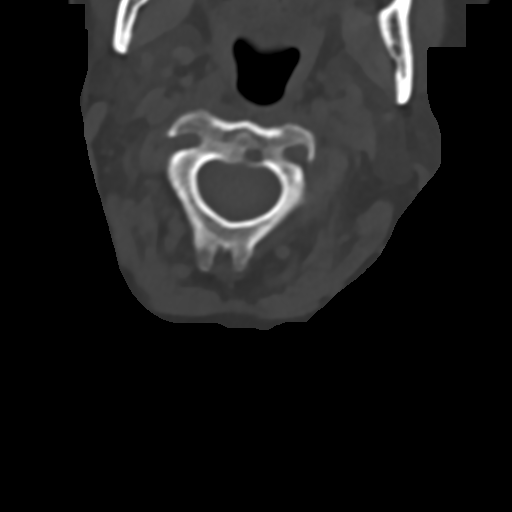
[im 68/82  bone]
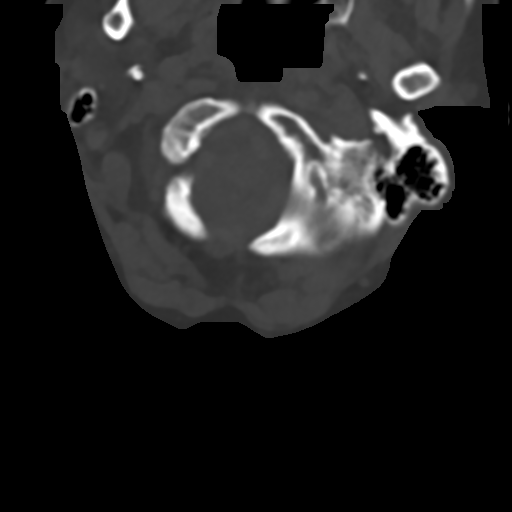

[Series 8: c_spine 2.0 sag bone · sagittal · 0.24mm/px · 5 of 61 slices shown, 6 images]
[im 21/61  bone]
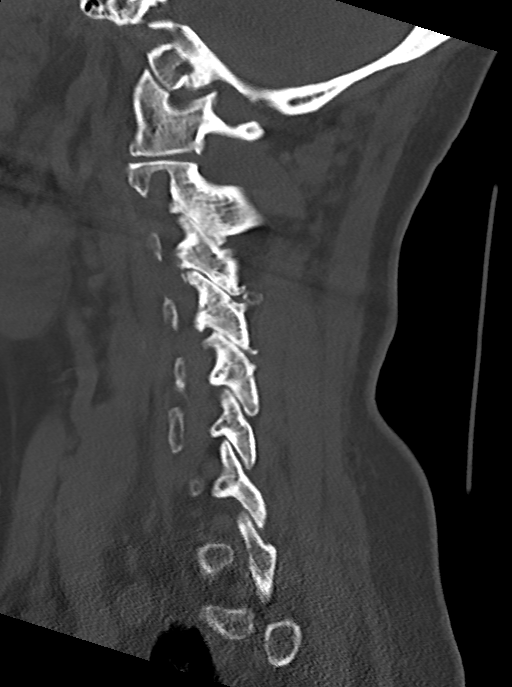
[im 26/61  bone]
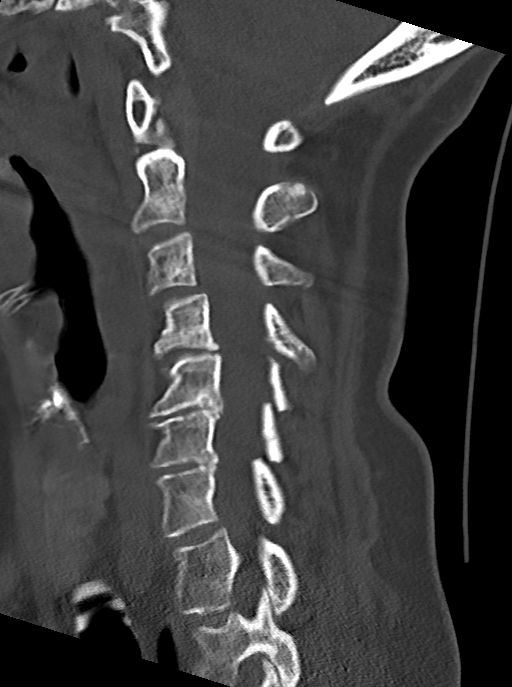
[im 31/61  soft-tissue]
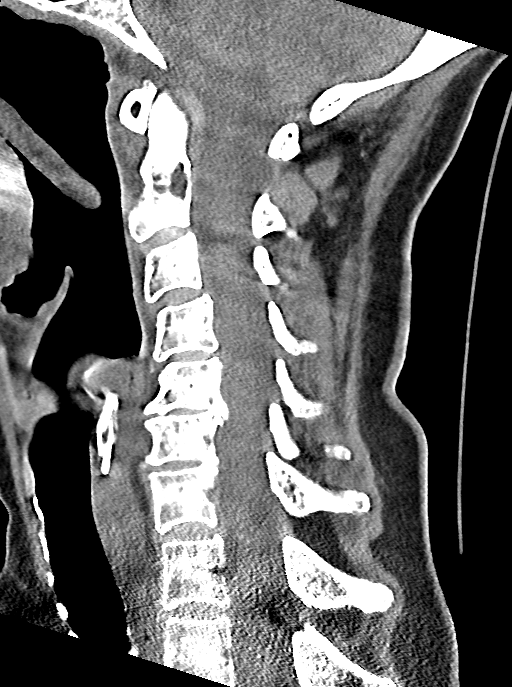
[im 31/61  bone]
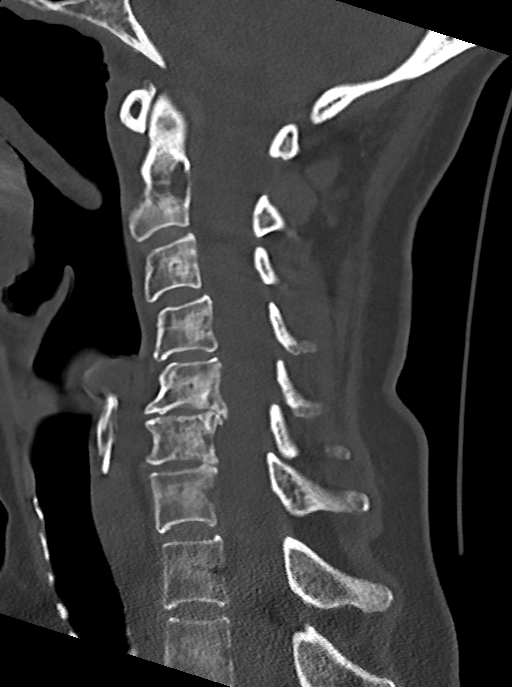
[im 36/61  bone]
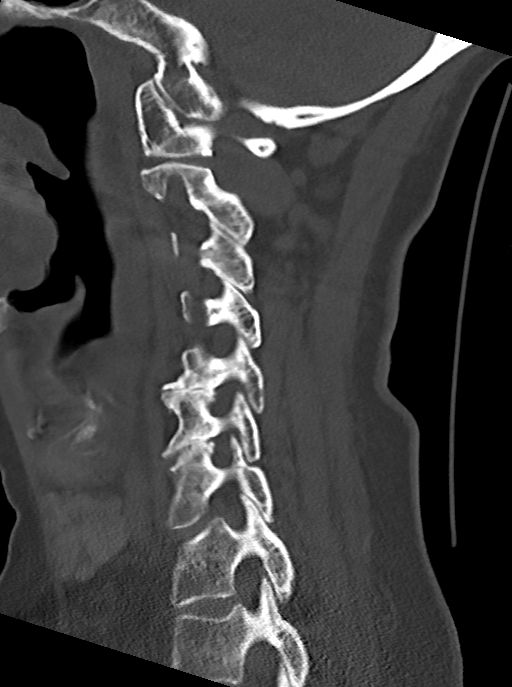
[im 41/61  bone]
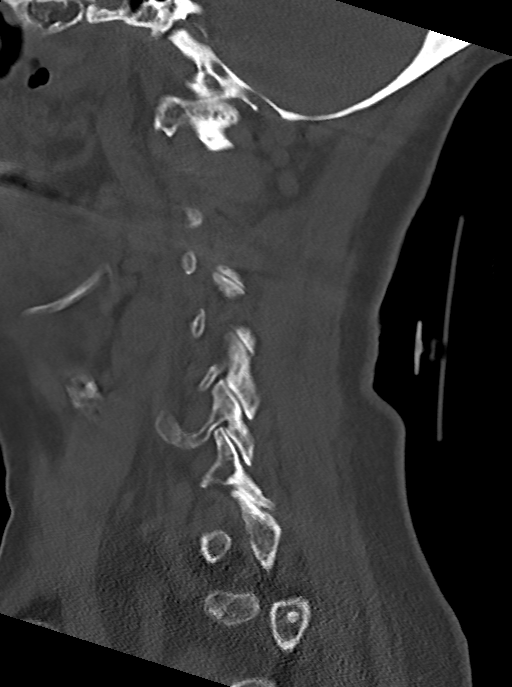

[Series 9: c_spine 2.0 cor bone · coronal · 0.24mm/px · 3 of 53 slices shown]
[im 11/53  bone]
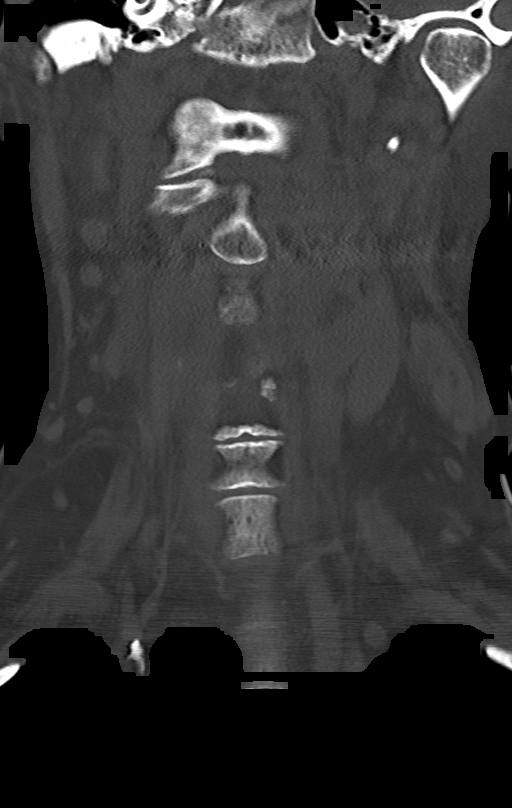
[im 21/53  bone]
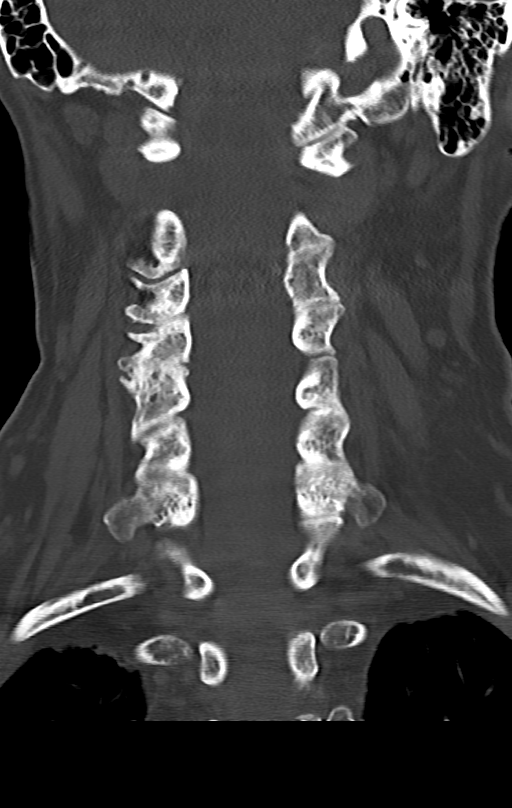
[im 32/53  bone]
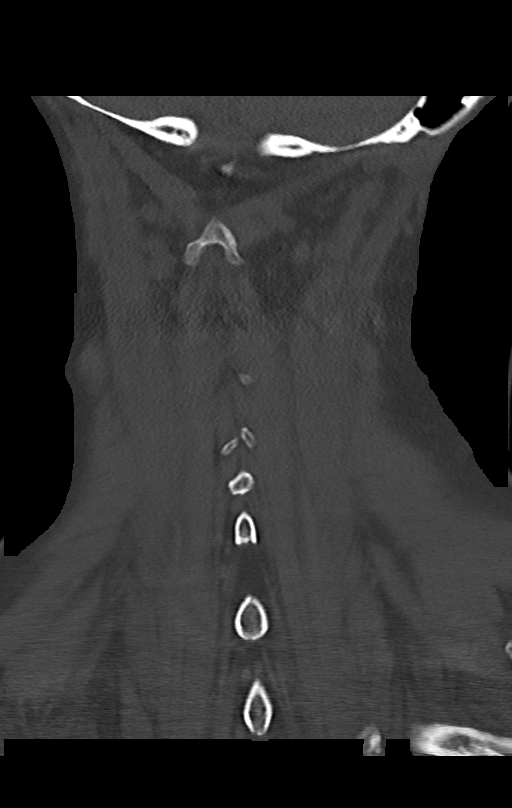

[12 of 33 positions shown; findings below may reference images not displayed]

FINDINGS: CT HEAD FINDINGS

Brain: No focal finding affects the brainstem or cerebellum.
Cerebral hemispheres show age related atrophy. Old lacunar
infarction in the left internal capsule. Old infarction in the
medial right occipital lobe. Confluent chronic small vessel disease
elsewhere affecting the cerebral hemispheric white matter. No sign
of acute infarction, mass lesion, hemorrhage, hydrocephalus or
extra-axial collection.

Vascular: There is atherosclerotic calcification of the major
vessels at the base of the brain.

Skull: Negative

Sinuses/Orbits: No significant sinus disease.  Orbits negative.

Other: None

CT CERVICAL SPINE FINDINGS

Alignment: Straightening of the normal cervical lordosis. 2 mm
degenerative anterolisthesis C3-4.

Skull base and vertebrae: No fracture. Likely benign lucency
posteriorly within the C2 vertebral body. Osteopenia in general.

Soft tissues and spinal canal: No significant soft tissue finding.

Disc levels: No significant abnormality at the foramen magnum or
C1-2.

C2-3: Facet osteoarthritis on the right.  No stenosis.

C3-4: Facet osteoarthritis on the right with 2 mm of
anterolisthesis. Bony foraminal narrowing on the right.

C4-5: Mild disc space narrowing. Facet osteoarthritis on the right.
Bony foraminal stenosis on the right.

C5-6: Bilateral uncovertebral osteophytes with mild bony foraminal
narrowing.

C6-7: Minimal uncovertebral osteophytes. Minimal bilateral foraminal
narrowing.

C7-T1: Negative interspace.

Upper chest: Negative

Other: None
IMPRESSION: Head CT: No acute or traumatic finding. Old ischemic changes as seen
previously and outlined above.

Cervical spine CT: No acute or traumatic finding. Chronic
degenerative spondylosis and facet arthropathy as seen previously
and outlined above.

## 2021-01-07 DIAGNOSIS — I69328 Other speech and language deficits following cerebral infarction: Secondary | ICD-10-CM | POA: Diagnosis not present

## 2021-01-07 DIAGNOSIS — R1312 Dysphagia, oropharyngeal phase: Secondary | ICD-10-CM | POA: Diagnosis not present

## 2021-01-08 DIAGNOSIS — Z20828 Contact with and (suspected) exposure to other viral communicable diseases: Secondary | ICD-10-CM | POA: Diagnosis not present

## 2021-01-09 DIAGNOSIS — R1312 Dysphagia, oropharyngeal phase: Secondary | ICD-10-CM | POA: Diagnosis not present

## 2021-01-09 DIAGNOSIS — I69328 Other speech and language deficits following cerebral infarction: Secondary | ICD-10-CM | POA: Diagnosis not present

## 2021-01-12 DIAGNOSIS — Z20828 Contact with and (suspected) exposure to other viral communicable diseases: Secondary | ICD-10-CM | POA: Diagnosis not present

## 2021-01-12 DIAGNOSIS — R1312 Dysphagia, oropharyngeal phase: Secondary | ICD-10-CM | POA: Diagnosis not present

## 2021-01-12 DIAGNOSIS — I69328 Other speech and language deficits following cerebral infarction: Secondary | ICD-10-CM | POA: Diagnosis not present

## 2021-01-12 NOTE — Progress Notes (Signed)
Carelink Summary Report / Loop Recorder 

## 2021-01-13 DIAGNOSIS — I69328 Other speech and language deficits following cerebral infarction: Secondary | ICD-10-CM | POA: Diagnosis not present

## 2021-01-13 DIAGNOSIS — R1312 Dysphagia, oropharyngeal phase: Secondary | ICD-10-CM | POA: Diagnosis not present

## 2021-01-15 DIAGNOSIS — Z20828 Contact with and (suspected) exposure to other viral communicable diseases: Secondary | ICD-10-CM | POA: Diagnosis not present

## 2021-01-19 DIAGNOSIS — Z20828 Contact with and (suspected) exposure to other viral communicable diseases: Secondary | ICD-10-CM | POA: Diagnosis not present

## 2021-01-21 IMAGING — DX DG CHEST 1V
1 series · 1 of 1 positions shown · non-contrast
Comparison: April 13, 2020

CLINICAL DATA: Syncope.

EXAM:
CHEST  1 VIEW

[chest ap]
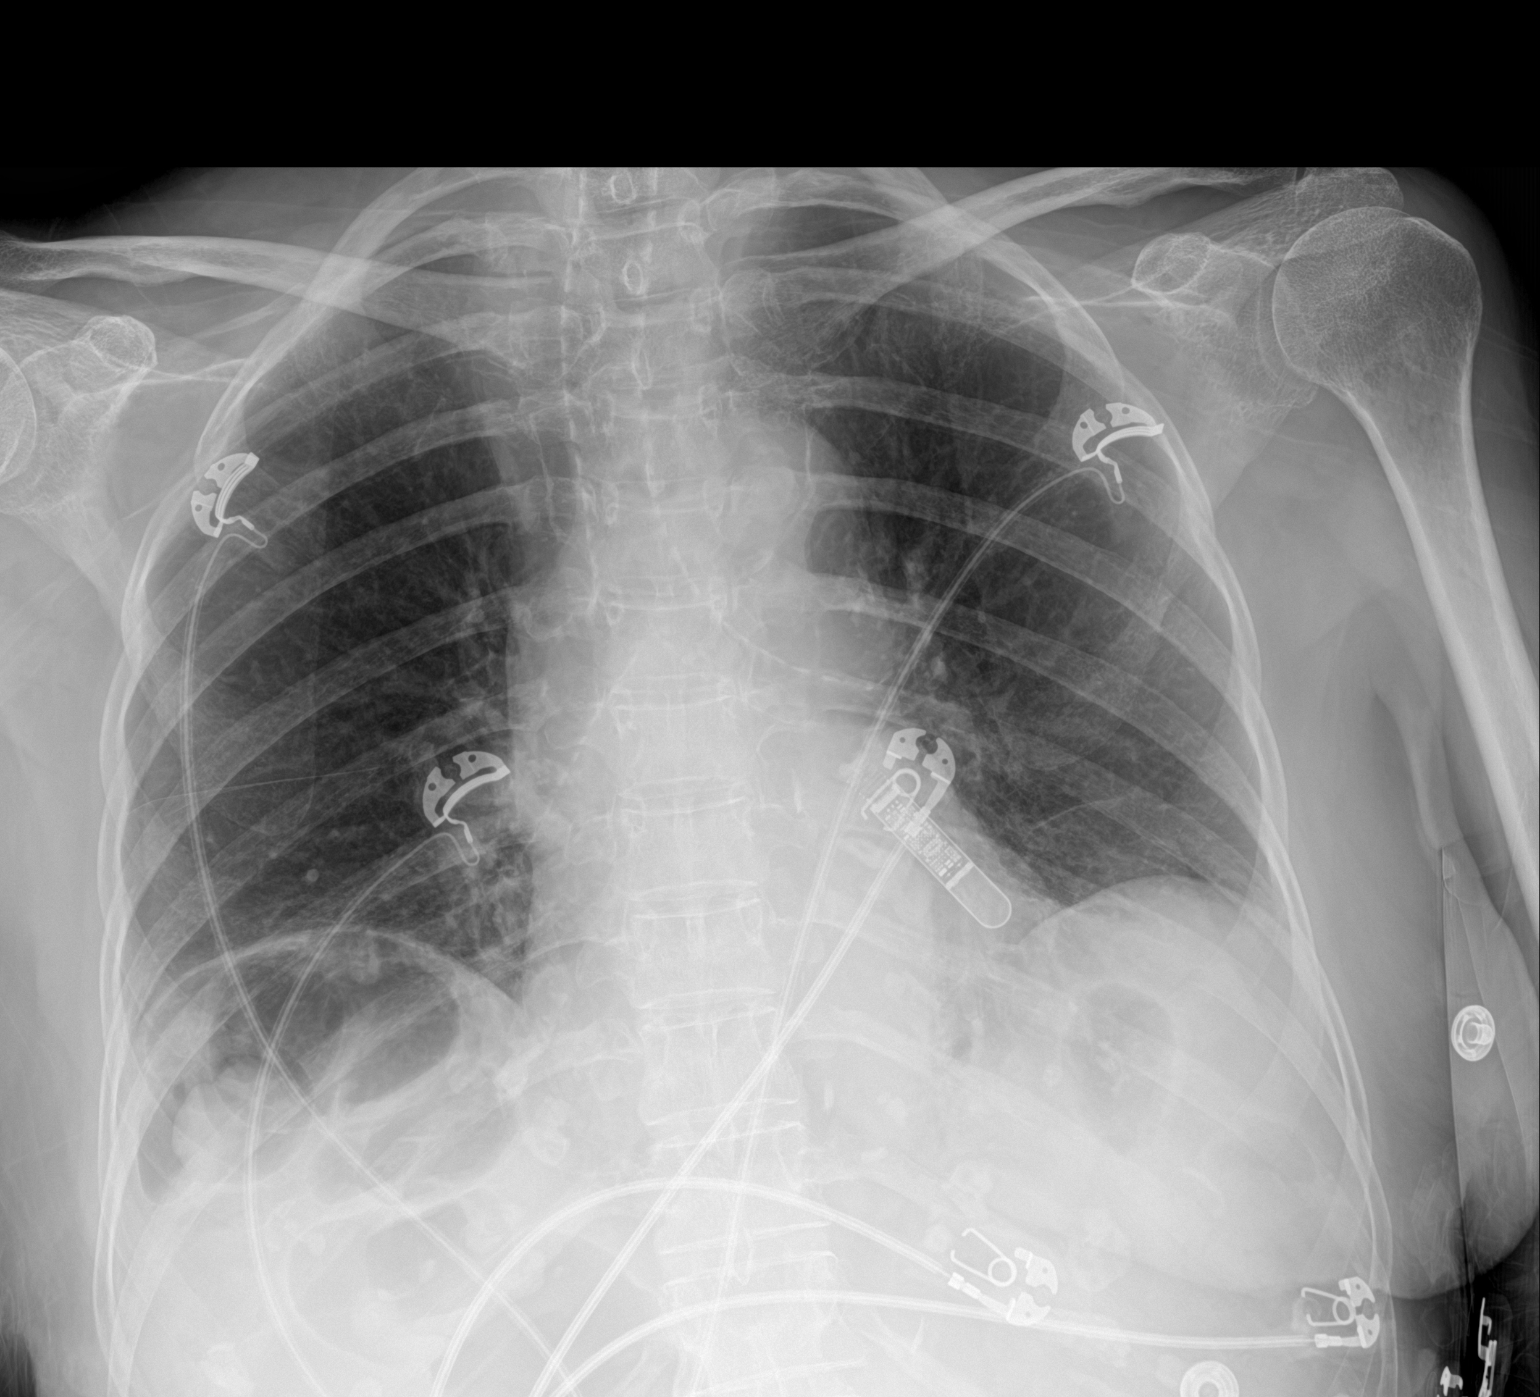

[1 of 1 positions shown; findings below may reference images not displayed]

FINDINGS: There is no evidence of acute infiltrate, pleural effusion or
pneumothorax. The heart size and mediastinal contours are within
normal limits. The visualized skeletal structures are unremarkable.
IMPRESSION: No active disease.

## 2021-01-22 DIAGNOSIS — Z20828 Contact with and (suspected) exposure to other viral communicable diseases: Secondary | ICD-10-CM | POA: Diagnosis not present

## 2021-01-26 DIAGNOSIS — Z20828 Contact with and (suspected) exposure to other viral communicable diseases: Secondary | ICD-10-CM | POA: Diagnosis not present

## 2021-01-26 DIAGNOSIS — I951 Orthostatic hypotension: Secondary | ICD-10-CM | POA: Diagnosis not present

## 2021-01-26 DIAGNOSIS — M810 Age-related osteoporosis without current pathological fracture: Secondary | ICD-10-CM | POA: Diagnosis not present

## 2021-01-26 DIAGNOSIS — F0390 Unspecified dementia without behavioral disturbance: Secondary | ICD-10-CM | POA: Diagnosis not present

## 2021-01-26 DIAGNOSIS — Z8673 Personal history of transient ischemic attack (TIA), and cerebral infarction without residual deficits: Secondary | ICD-10-CM | POA: Diagnosis not present

## 2021-01-28 LAB — CUP PACEART REMOTE DEVICE CHECK
Date Time Interrogation Session: 20220214033159
Implantable Pulse Generator Implant Date: 20210322

## 2021-01-29 DIAGNOSIS — Z20828 Contact with and (suspected) exposure to other viral communicable diseases: Secondary | ICD-10-CM | POA: Diagnosis not present

## 2021-02-02 ENCOUNTER — Ambulatory Visit (INDEPENDENT_AMBULATORY_CARE_PROVIDER_SITE_OTHER): Payer: Medicare PPO

## 2021-02-02 ENCOUNTER — Other Ambulatory Visit: Payer: Self-pay

## 2021-02-02 DIAGNOSIS — I639 Cerebral infarction, unspecified: Secondary | ICD-10-CM

## 2021-02-02 DIAGNOSIS — Z20828 Contact with and (suspected) exposure to other viral communicable diseases: Secondary | ICD-10-CM | POA: Diagnosis not present

## 2021-02-05 DIAGNOSIS — Z20828 Contact with and (suspected) exposure to other viral communicable diseases: Secondary | ICD-10-CM | POA: Diagnosis not present

## 2021-02-06 NOTE — Progress Notes (Signed)
Carelink Summary Report / Loop Recorder 

## 2021-02-09 DIAGNOSIS — Z20828 Contact with and (suspected) exposure to other viral communicable diseases: Secondary | ICD-10-CM | POA: Diagnosis not present

## 2021-03-07 LAB — CUP PACEART REMOTE DEVICE CHECK
Date Time Interrogation Session: 20220319043012
Implantable Pulse Generator Implant Date: 20210322

## 2021-03-09 ENCOUNTER — Ambulatory Visit (INDEPENDENT_AMBULATORY_CARE_PROVIDER_SITE_OTHER): Payer: Medicare PPO

## 2021-03-09 DIAGNOSIS — I6310 Cerebral infarction due to embolism of unspecified precerebral artery: Secondary | ICD-10-CM | POA: Diagnosis not present

## 2021-03-11 DIAGNOSIS — R2689 Other abnormalities of gait and mobility: Secondary | ICD-10-CM | POA: Diagnosis not present

## 2021-03-11 DIAGNOSIS — M6281 Muscle weakness (generalized): Secondary | ICD-10-CM | POA: Diagnosis not present

## 2021-03-13 DIAGNOSIS — R2689 Other abnormalities of gait and mobility: Secondary | ICD-10-CM | POA: Diagnosis not present

## 2021-03-13 DIAGNOSIS — M6281 Muscle weakness (generalized): Secondary | ICD-10-CM | POA: Diagnosis not present

## 2021-03-16 DIAGNOSIS — M6281 Muscle weakness (generalized): Secondary | ICD-10-CM | POA: Diagnosis not present

## 2021-03-16 DIAGNOSIS — R2689 Other abnormalities of gait and mobility: Secondary | ICD-10-CM | POA: Diagnosis not present

## 2021-03-18 DIAGNOSIS — R2689 Other abnormalities of gait and mobility: Secondary | ICD-10-CM | POA: Diagnosis not present

## 2021-03-18 DIAGNOSIS — M6281 Muscle weakness (generalized): Secondary | ICD-10-CM | POA: Diagnosis not present

## 2021-03-19 DIAGNOSIS — M6281 Muscle weakness (generalized): Secondary | ICD-10-CM | POA: Diagnosis not present

## 2021-03-19 DIAGNOSIS — R2689 Other abnormalities of gait and mobility: Secondary | ICD-10-CM | POA: Diagnosis not present

## 2021-03-19 NOTE — Progress Notes (Signed)
Carelink Summary Report / Loop Recorder 

## 2021-03-20 DIAGNOSIS — M6281 Muscle weakness (generalized): Secondary | ICD-10-CM | POA: Diagnosis not present

## 2021-03-20 DIAGNOSIS — R2689 Other abnormalities of gait and mobility: Secondary | ICD-10-CM | POA: Diagnosis not present

## 2021-03-24 DIAGNOSIS — M6281 Muscle weakness (generalized): Secondary | ICD-10-CM | POA: Diagnosis not present

## 2021-03-24 DIAGNOSIS — R2689 Other abnormalities of gait and mobility: Secondary | ICD-10-CM | POA: Diagnosis not present

## 2021-03-25 DIAGNOSIS — M6281 Muscle weakness (generalized): Secondary | ICD-10-CM | POA: Diagnosis not present

## 2021-03-25 DIAGNOSIS — R2689 Other abnormalities of gait and mobility: Secondary | ICD-10-CM | POA: Diagnosis not present

## 2021-03-26 DIAGNOSIS — M6281 Muscle weakness (generalized): Secondary | ICD-10-CM | POA: Diagnosis not present

## 2021-03-26 DIAGNOSIS — R2689 Other abnormalities of gait and mobility: Secondary | ICD-10-CM | POA: Diagnosis not present

## 2021-03-27 DIAGNOSIS — R2689 Other abnormalities of gait and mobility: Secondary | ICD-10-CM | POA: Diagnosis not present

## 2021-03-27 DIAGNOSIS — M6281 Muscle weakness (generalized): Secondary | ICD-10-CM | POA: Diagnosis not present

## 2021-03-30 DIAGNOSIS — M6281 Muscle weakness (generalized): Secondary | ICD-10-CM | POA: Diagnosis not present

## 2021-03-30 DIAGNOSIS — R2689 Other abnormalities of gait and mobility: Secondary | ICD-10-CM | POA: Diagnosis not present

## 2021-03-31 DIAGNOSIS — R2689 Other abnormalities of gait and mobility: Secondary | ICD-10-CM | POA: Diagnosis not present

## 2021-03-31 DIAGNOSIS — M6281 Muscle weakness (generalized): Secondary | ICD-10-CM | POA: Diagnosis not present

## 2021-04-01 DIAGNOSIS — R2689 Other abnormalities of gait and mobility: Secondary | ICD-10-CM | POA: Diagnosis not present

## 2021-04-01 DIAGNOSIS — M6281 Muscle weakness (generalized): Secondary | ICD-10-CM | POA: Diagnosis not present

## 2021-04-02 DIAGNOSIS — R2689 Other abnormalities of gait and mobility: Secondary | ICD-10-CM | POA: Diagnosis not present

## 2021-04-02 DIAGNOSIS — M6281 Muscle weakness (generalized): Secondary | ICD-10-CM | POA: Diagnosis not present

## 2021-04-03 DIAGNOSIS — M6281 Muscle weakness (generalized): Secondary | ICD-10-CM | POA: Diagnosis not present

## 2021-04-06 DIAGNOSIS — R2689 Other abnormalities of gait and mobility: Secondary | ICD-10-CM | POA: Diagnosis not present

## 2021-04-06 DIAGNOSIS — M6281 Muscle weakness (generalized): Secondary | ICD-10-CM | POA: Diagnosis not present

## 2021-04-07 DIAGNOSIS — M6281 Muscle weakness (generalized): Secondary | ICD-10-CM | POA: Diagnosis not present

## 2021-04-07 DIAGNOSIS — R2689 Other abnormalities of gait and mobility: Secondary | ICD-10-CM | POA: Diagnosis not present

## 2021-04-08 DIAGNOSIS — R2689 Other abnormalities of gait and mobility: Secondary | ICD-10-CM | POA: Diagnosis not present

## 2021-04-08 DIAGNOSIS — M6281 Muscle weakness (generalized): Secondary | ICD-10-CM | POA: Diagnosis not present

## 2021-04-10 DIAGNOSIS — M6281 Muscle weakness (generalized): Secondary | ICD-10-CM | POA: Diagnosis not present

## 2021-04-13 ENCOUNTER — Ambulatory Visit (INDEPENDENT_AMBULATORY_CARE_PROVIDER_SITE_OTHER): Payer: Medicare PPO

## 2021-04-13 DIAGNOSIS — I6310 Cerebral infarction due to embolism of unspecified precerebral artery: Secondary | ICD-10-CM

## 2021-04-13 DIAGNOSIS — M6281 Muscle weakness (generalized): Secondary | ICD-10-CM | POA: Diagnosis not present

## 2021-04-13 LAB — CUP PACEART REMOTE DEVICE CHECK
Date Time Interrogation Session: 20220430231731
Implantable Pulse Generator Implant Date: 20210322

## 2021-04-14 DIAGNOSIS — M6281 Muscle weakness (generalized): Secondary | ICD-10-CM | POA: Diagnosis not present

## 2021-04-14 DIAGNOSIS — R2689 Other abnormalities of gait and mobility: Secondary | ICD-10-CM | POA: Diagnosis not present

## 2021-04-15 DIAGNOSIS — R2689 Other abnormalities of gait and mobility: Secondary | ICD-10-CM | POA: Diagnosis not present

## 2021-04-15 DIAGNOSIS — M6281 Muscle weakness (generalized): Secondary | ICD-10-CM | POA: Diagnosis not present

## 2021-04-16 ENCOUNTER — Telehealth: Payer: Self-pay | Admitting: Cardiology

## 2021-04-16 DIAGNOSIS — M6281 Muscle weakness (generalized): Secondary | ICD-10-CM | POA: Diagnosis not present

## 2021-04-16 DIAGNOSIS — R2689 Other abnormalities of gait and mobility: Secondary | ICD-10-CM | POA: Diagnosis not present

## 2021-04-16 NOTE — Telephone Encounter (Signed)
Linq transmission received this morning shows No arrhythmias logged.

## 2021-04-16 NOTE — Telephone Encounter (Signed)
Sheila Parrish is calling on behalf of her mother with questions and concerns that she has about her mother.She also states her mother has been having some episodes and needs a medicala opinion.Please Advise

## 2021-04-16 NOTE — Telephone Encounter (Signed)
Spoke with the patient's daughter who states that the patient has a history of having seizure-like episodes. They are usually rare but she has had two episodes recently and has been concerned. She states that the patient will be sitting in a chair and her BP will drop (85/65), patient drools, and it appears that she loses consciousness. Patient will come back too fairly quickly and shortly after blood pressure will come back up. She has been seeing her PCP in regards to these episodes but they are unsure why it is happening. PCP advised her to contact Dr. Radford Pax in regards to BP medications.  Patient's daughter states that the patient has been wearing her compression hose daily. She does not wear the abdominal binder and she is laying down most of the time and it is uncomfortable for the patient. She reports that they have been pushing fluids. Patient's daughter is also wondering if we could make sure her heart was in normal rhythm during the most recent episode on 05/02.

## 2021-04-16 NOTE — Telephone Encounter (Signed)
Spoke with the patient's daughter and advised her on recommendations from Dr. Radford Pax. Patient currently has a loop recorder. Showed no arrhythmias during her event on 5/02.

## 2021-04-21 DIAGNOSIS — M6281 Muscle weakness (generalized): Secondary | ICD-10-CM | POA: Diagnosis not present

## 2021-04-21 DIAGNOSIS — R2689 Other abnormalities of gait and mobility: Secondary | ICD-10-CM | POA: Diagnosis not present

## 2021-04-22 DIAGNOSIS — M6281 Muscle weakness (generalized): Secondary | ICD-10-CM | POA: Diagnosis not present

## 2021-04-23 DIAGNOSIS — M6281 Muscle weakness (generalized): Secondary | ICD-10-CM | POA: Diagnosis not present

## 2021-04-23 DIAGNOSIS — R2689 Other abnormalities of gait and mobility: Secondary | ICD-10-CM | POA: Diagnosis not present

## 2021-04-29 DIAGNOSIS — R2689 Other abnormalities of gait and mobility: Secondary | ICD-10-CM | POA: Diagnosis not present

## 2021-04-29 DIAGNOSIS — M6281 Muscle weakness (generalized): Secondary | ICD-10-CM | POA: Diagnosis not present

## 2021-04-30 DIAGNOSIS — R2689 Other abnormalities of gait and mobility: Secondary | ICD-10-CM | POA: Diagnosis not present

## 2021-04-30 DIAGNOSIS — M6281 Muscle weakness (generalized): Secondary | ICD-10-CM | POA: Diagnosis not present

## 2021-05-01 DIAGNOSIS — M6281 Muscle weakness (generalized): Secondary | ICD-10-CM | POA: Diagnosis not present

## 2021-05-01 DIAGNOSIS — R2689 Other abnormalities of gait and mobility: Secondary | ICD-10-CM | POA: Diagnosis not present

## 2021-05-01 NOTE — Progress Notes (Signed)
Carelink Summary Report / Loop Recorder 

## 2021-05-04 DIAGNOSIS — Z20828 Contact with and (suspected) exposure to other viral communicable diseases: Secondary | ICD-10-CM | POA: Diagnosis not present

## 2021-05-06 DIAGNOSIS — R2689 Other abnormalities of gait and mobility: Secondary | ICD-10-CM | POA: Diagnosis not present

## 2021-05-06 DIAGNOSIS — M6281 Muscle weakness (generalized): Secondary | ICD-10-CM | POA: Diagnosis not present

## 2021-05-08 DIAGNOSIS — R2689 Other abnormalities of gait and mobility: Secondary | ICD-10-CM | POA: Diagnosis not present

## 2021-05-08 DIAGNOSIS — M6281 Muscle weakness (generalized): Secondary | ICD-10-CM | POA: Diagnosis not present

## 2021-05-12 DIAGNOSIS — R2689 Other abnormalities of gait and mobility: Secondary | ICD-10-CM | POA: Diagnosis not present

## 2021-05-12 DIAGNOSIS — M6281 Muscle weakness (generalized): Secondary | ICD-10-CM | POA: Diagnosis not present

## 2021-05-13 DIAGNOSIS — M6281 Muscle weakness (generalized): Secondary | ICD-10-CM | POA: Diagnosis not present

## 2021-05-14 DIAGNOSIS — M6281 Muscle weakness (generalized): Secondary | ICD-10-CM | POA: Diagnosis not present

## 2021-05-15 DIAGNOSIS — M6281 Muscle weakness (generalized): Secondary | ICD-10-CM | POA: Diagnosis not present

## 2021-05-15 DIAGNOSIS — R2689 Other abnormalities of gait and mobility: Secondary | ICD-10-CM | POA: Diagnosis not present

## 2021-05-17 LAB — CUP PACEART REMOTE DEVICE CHECK
Date Time Interrogation Session: 20220602232127
Implantable Pulse Generator Implant Date: 20210322

## 2021-05-18 ENCOUNTER — Ambulatory Visit (INDEPENDENT_AMBULATORY_CARE_PROVIDER_SITE_OTHER): Payer: Medicare PPO

## 2021-05-18 DIAGNOSIS — R2689 Other abnormalities of gait and mobility: Secondary | ICD-10-CM | POA: Diagnosis not present

## 2021-05-18 DIAGNOSIS — M6281 Muscle weakness (generalized): Secondary | ICD-10-CM | POA: Diagnosis not present

## 2021-05-18 DIAGNOSIS — I6310 Cerebral infarction due to embolism of unspecified precerebral artery: Secondary | ICD-10-CM | POA: Diagnosis not present

## 2021-05-19 DIAGNOSIS — M6281 Muscle weakness (generalized): Secondary | ICD-10-CM | POA: Diagnosis not present

## 2021-05-20 DIAGNOSIS — R2689 Other abnormalities of gait and mobility: Secondary | ICD-10-CM | POA: Diagnosis not present

## 2021-05-20 DIAGNOSIS — M6281 Muscle weakness (generalized): Secondary | ICD-10-CM | POA: Diagnosis not present

## 2021-05-21 DIAGNOSIS — M6281 Muscle weakness (generalized): Secondary | ICD-10-CM | POA: Diagnosis not present

## 2021-05-25 DIAGNOSIS — M6281 Muscle weakness (generalized): Secondary | ICD-10-CM | POA: Diagnosis not present

## 2021-05-25 DIAGNOSIS — R2689 Other abnormalities of gait and mobility: Secondary | ICD-10-CM | POA: Diagnosis not present

## 2021-05-26 DIAGNOSIS — M6281 Muscle weakness (generalized): Secondary | ICD-10-CM | POA: Diagnosis not present

## 2021-05-26 DIAGNOSIS — R2689 Other abnormalities of gait and mobility: Secondary | ICD-10-CM | POA: Diagnosis not present

## 2021-05-27 ENCOUNTER — Other Ambulatory Visit: Payer: Self-pay | Admitting: Cardiology

## 2021-05-28 DIAGNOSIS — M6281 Muscle weakness (generalized): Secondary | ICD-10-CM | POA: Diagnosis not present

## 2021-06-02 DIAGNOSIS — R2689 Other abnormalities of gait and mobility: Secondary | ICD-10-CM | POA: Diagnosis not present

## 2021-06-02 DIAGNOSIS — M6281 Muscle weakness (generalized): Secondary | ICD-10-CM | POA: Diagnosis not present

## 2021-06-04 DIAGNOSIS — M6281 Muscle weakness (generalized): Secondary | ICD-10-CM | POA: Diagnosis not present

## 2021-06-05 DIAGNOSIS — M6281 Muscle weakness (generalized): Secondary | ICD-10-CM | POA: Diagnosis not present

## 2021-06-05 DIAGNOSIS — R2689 Other abnormalities of gait and mobility: Secondary | ICD-10-CM | POA: Diagnosis not present

## 2021-06-08 DIAGNOSIS — M6281 Muscle weakness (generalized): Secondary | ICD-10-CM | POA: Diagnosis not present

## 2021-06-08 NOTE — Progress Notes (Signed)
Carelink Summary Report / Loop Recorder 

## 2021-06-09 DIAGNOSIS — M6281 Muscle weakness (generalized): Secondary | ICD-10-CM | POA: Diagnosis not present

## 2021-06-09 DIAGNOSIS — R2689 Other abnormalities of gait and mobility: Secondary | ICD-10-CM | POA: Diagnosis not present

## 2021-06-10 DIAGNOSIS — M6281 Muscle weakness (generalized): Secondary | ICD-10-CM | POA: Diagnosis not present

## 2021-06-11 DIAGNOSIS — M6281 Muscle weakness (generalized): Secondary | ICD-10-CM | POA: Diagnosis not present

## 2021-06-11 DIAGNOSIS — R2689 Other abnormalities of gait and mobility: Secondary | ICD-10-CM | POA: Diagnosis not present

## 2021-06-13 DIAGNOSIS — R2689 Other abnormalities of gait and mobility: Secondary | ICD-10-CM | POA: Diagnosis not present

## 2021-06-13 DIAGNOSIS — M6281 Muscle weakness (generalized): Secondary | ICD-10-CM | POA: Diagnosis not present

## 2021-06-16 DIAGNOSIS — M6281 Muscle weakness (generalized): Secondary | ICD-10-CM | POA: Diagnosis not present

## 2021-06-16 DIAGNOSIS — R2689 Other abnormalities of gait and mobility: Secondary | ICD-10-CM | POA: Diagnosis not present

## 2021-06-17 DIAGNOSIS — M6281 Muscle weakness (generalized): Secondary | ICD-10-CM | POA: Diagnosis not present

## 2021-06-17 DIAGNOSIS — R2689 Other abnormalities of gait and mobility: Secondary | ICD-10-CM | POA: Diagnosis not present

## 2021-06-18 DIAGNOSIS — R2689 Other abnormalities of gait and mobility: Secondary | ICD-10-CM | POA: Diagnosis not present

## 2021-06-18 DIAGNOSIS — M6281 Muscle weakness (generalized): Secondary | ICD-10-CM | POA: Diagnosis not present

## 2021-06-19 DIAGNOSIS — M6281 Muscle weakness (generalized): Secondary | ICD-10-CM | POA: Diagnosis not present

## 2021-06-21 LAB — CUP PACEART REMOTE DEVICE CHECK
Date Time Interrogation Session: 20220705233456
Implantable Pulse Generator Implant Date: 20210322

## 2021-06-22 ENCOUNTER — Ambulatory Visit (INDEPENDENT_AMBULATORY_CARE_PROVIDER_SITE_OTHER): Payer: Medicare PPO

## 2021-06-22 DIAGNOSIS — M6281 Muscle weakness (generalized): Secondary | ICD-10-CM | POA: Diagnosis not present

## 2021-06-22 DIAGNOSIS — I6310 Cerebral infarction due to embolism of unspecified precerebral artery: Secondary | ICD-10-CM

## 2021-06-23 DIAGNOSIS — R2689 Other abnormalities of gait and mobility: Secondary | ICD-10-CM | POA: Diagnosis not present

## 2021-06-23 DIAGNOSIS — M6281 Muscle weakness (generalized): Secondary | ICD-10-CM | POA: Diagnosis not present

## 2021-06-24 DIAGNOSIS — M6281 Muscle weakness (generalized): Secondary | ICD-10-CM | POA: Diagnosis not present

## 2021-06-25 DIAGNOSIS — M6281 Muscle weakness (generalized): Secondary | ICD-10-CM | POA: Diagnosis not present

## 2021-06-25 DIAGNOSIS — R2689 Other abnormalities of gait and mobility: Secondary | ICD-10-CM | POA: Diagnosis not present

## 2021-06-29 DIAGNOSIS — M6281 Muscle weakness (generalized): Secondary | ICD-10-CM | POA: Diagnosis not present

## 2021-06-29 DIAGNOSIS — R2689 Other abnormalities of gait and mobility: Secondary | ICD-10-CM | POA: Diagnosis not present

## 2021-06-30 ENCOUNTER — Other Ambulatory Visit: Payer: Self-pay | Admitting: Internal Medicine

## 2021-07-01 DIAGNOSIS — R2689 Other abnormalities of gait and mobility: Secondary | ICD-10-CM | POA: Diagnosis not present

## 2021-07-01 DIAGNOSIS — M6281 Muscle weakness (generalized): Secondary | ICD-10-CM | POA: Diagnosis not present

## 2021-07-14 NOTE — Progress Notes (Signed)
Carelink Summary Report / Loop Recorder 

## 2021-07-22 ENCOUNTER — Telehealth: Payer: Self-pay

## 2021-07-22 ENCOUNTER — Other Ambulatory Visit: Payer: Self-pay | Admitting: Physician Assistant

## 2021-07-22 NOTE — Telephone Encounter (Signed)
Ok to refill. Richardson Dopp, PA-C    07/22/2021 1:39 PM

## 2021-07-22 NOTE — Telephone Encounter (Signed)
(   3:45 pm) Palliative care SW completed a follow-up call to patient's daughter-Laura. She is requesting continued visits/support by RN/SW team with her mother. SW scheduled a visit with patient/PCG for 8/22@ 11 am, [employer] [place].

## 2021-07-23 ENCOUNTER — Other Ambulatory Visit: Payer: Self-pay | Admitting: Internal Medicine

## 2021-07-23 DIAGNOSIS — I6310 Cerebral infarction due to embolism of unspecified precerebral artery: Secondary | ICD-10-CM

## 2021-07-27 ENCOUNTER — Ambulatory Visit (INDEPENDENT_AMBULATORY_CARE_PROVIDER_SITE_OTHER): Payer: Medicare PPO

## 2021-07-27 DIAGNOSIS — I6310 Cerebral infarction due to embolism of unspecified precerebral artery: Secondary | ICD-10-CM | POA: Diagnosis not present

## 2021-07-27 DIAGNOSIS — M810 Age-related osteoporosis without current pathological fracture: Secondary | ICD-10-CM | POA: Diagnosis not present

## 2021-07-27 LAB — CUP PACEART REMOTE DEVICE CHECK
Date Time Interrogation Session: 20220808005554
Implantable Pulse Generator Implant Date: 20210322

## 2021-08-03 ENCOUNTER — Other Ambulatory Visit: Payer: Medicare PPO | Admitting: *Deleted

## 2021-08-03 ENCOUNTER — Other Ambulatory Visit: Payer: Self-pay

## 2021-08-03 ENCOUNTER — Other Ambulatory Visit: Payer: Medicare PPO

## 2021-08-03 VITALS — BP 112/58 | HR 69 | Temp 97.7°F | Resp 16

## 2021-08-03 DIAGNOSIS — Z515 Encounter for palliative care: Secondary | ICD-10-CM

## 2021-08-03 NOTE — Progress Notes (Signed)
[facility] SW NOTE  PATIENT NAME: Sheila Parrish DOB: 1927-02-18 MRN: [id number]  PRIMARY CARE PROVIDER: Lavone Orn, MD  RESPONSIBLE PARTY:  Acct ID - Guarantor Home Phone Work Phone Relationship Acct Type  [id number] Sheila [id number]  Self P/F     [address], [place], [place] [address]     PLAN OF CARE and INTERVENTIONS:             GOALS OF CARE/ ADVANCE CARE PLANNING:  Goal is for patient to remain in her apartment. Patient is a DNR.  SOCIAL/EMOTIONAL/SPIRITUAL ASSESSMENT/ INTERVENTIONS:  SW and RN-Sheila Parrish completed a follow-up visit with patient at her apartment. She was present with her daughter and private sitter-Sheila Parrish. Patient was finishing up breakfast as she was sitting in her wheelchair. Patient is non-verbal, but provided non-verbal verbalizations such as eye contact and a smile. She did not appear to be in any pain. Her sitter and daughter provided a status update on patient. STATUS: Patient has swelling and intermittent pain to her right hand and shoulder. The swelling has increased to the point where they had to remove patient's rings. She has worked with occupational therapy, that stabilized the swelling and pain, but no significant improvement noted. Patient also has cold hands that are intermittently bluish-purple in color. Patient exercise daily by walking down the hall  and use the Nu-Step. A gate and abdomen belt to manage her blood pressures. She also wears compression socks. Patient has had a history of falls and episodes of passing out due to blood pressure. She also has a growth on the right side of her upper thought that contains blood and fluids. Patient takes her medication crushed in applesauce. She drinks at least one Ensure per day. Her appetite is good, but eat smaller portions throughout the day. Patient can feed herself, but occasionally needs help. Patient wears depends and is incontinent. Patient has 24 hour sitters. SW provided supportive  presence, reassurance of support, active and reflective listening, assessment of patient needs and comfort and provided encouragement. Next scheduled appointment scheduled for 09/14/21 @ 11 am. PATIENT/CAREGIVER EDUCATION/ COPING:  Patient appears to be content and coping well. She has 24 hour private caregiver support and active support of her daughter.  PERSONAL EMERGENCY PLAN:  Per facility protocol.  COMMUNITY RESOURCES COORDINATION/ HEALTH CARE NAVIGATION:  Patient has 24 hour caregivers through [facility].  FINANCIAL/LEGAL CONCERNS/INTERVENTIONS:  None.     SOCIAL HX:  Social History   Tobacco Use   Smoking status: Never   Smokeless tobacco: Never  Substance Use Topics   Alcohol use: No    CODE STATUS: DNR ADVANCED DIRECTIVES: Yes MOST FORM COMPLETE:  Yes HOSPICE EDUCATION PROVIDED: No  PPS: Patient is alert and oriented to self and situation. She is dependent for all personal care needs.  Duration of visit and documentation: 60 minutes.  [address] [place], [place]

## 2021-08-03 NOTE — Progress Notes (Signed)
[facility] PALLIATIVE CARE RN NOTE  PATIENT NAME: Sheila Parrish DOB: Jan 05, 1927 MRN: [id number]  PRIMARY CARE PROVIDER: Lavone Orn, MD  RESPONSIBLE PARTY: Antoine Poche (daughter) Acct ID - Guarantor Home Phone Work Phone Relationship Acct Type  [id number] MURLINE, [phone]  Self P/F     [address], [place], [place] [address]   Covid-19 Pre-screening Negative  PLAN OF CARE and INTERVENTION:  ADVANCE CARE PLANNING/GOALS OF CARE; Goal is for patient to remain in [place] apartment at [facility] with 24/7 caregivers PATIENT/CAREGIVER EDUCATION: Symptom management, safe mobility/transfers DISEASE STATUS: Joint palliative care visit completed with LCSW, M.Lonon. Met with patient, daughter and hired caregiver Karlene Einstein in patient's [place] apartment. Patient sitting up in her wheelchair at her dining table awake and alert finishing eating her fruit. Patient is able to make eye contact, but remains non-verbal. She is able to understand what is being said and will nod and smile. Daughter reports she does talk at times, but not often. She has a history of TIAs. She does experience some pain and stiffness in her right hand along with some swelling. She takes Tylenol as needed and caregiver massages her hand, applies ice and exercises it each morning. It does become bluish/purple in color at times. It is normal colored at this time. She also has pain in her left knee from history of knee replacement. She is unable to place her left foot flat on the floor. Biofreeze is applied which is helpful. Caregiver has patient doing stretches and exercises daily shown to them by PT/OT in the past. Patient is ambulatory with walker, gait belt and 1 person assistance. They walk 2 laps inside the facility almost daily along with using the NuStep. She requires assistance with all ADLs. She is able to feed herself most days but other days must be fed. She often refuses getting her teeth brushed by caregivers. They  have tried using mouth swabs and re-approaching. This helps occasionally. She does have issues of hypotension that can occur when patient is just sitting. She will all of a sudden have a drop in her blood pressure and pass out. The plan is to only call EMS if she does not begin responding within about 15 minutes. She takes Pyridostigmine twice daily. She also wears an abdominal compression belt to help. The last incident occurred about a month ago. She had a Prolia injection last week. It is becoming more difficult getting patient to appointments so they are doing mainly telehealth with her PCP. Her appetite is variable. She eats several small meals per day. Some days she may not eat much all day, but tends to make up for it the next 2 days. She drinks Ensure daily. Her medications are crushed and given in applesauce or Ensure. She is intermittently incontinent of both bowel and bladder and wears Depends. Last bowel movement today. She has a growth noted to the outside of her right leg. It fills with fluid, bursts on its own, then scabs over. It has been assessed by her Dermatologist and cardiologist per daughter. It does not hurt or bother patient. No treatments have been done. Currently it is staying small in size and only filling up with clear fluid, vs blood mixed with clear as it has done in the past. Will continue to monitor.  HISTORY OF PRESENT ILLNESS: This is a 87 yo female with a history of CVA, syncope and orthostatic dizziness. Palliative care team continues to follow patient for goals of care, symptom management  and complex decision making.  CODE STATUS: DNR ADVANCED DIRECTIVES: Y MOST FORM: yes PPS: 50%   PHYSICAL EXAM:   VITALS: Today's Vitals   08/03/21 1131  BP: (!) 112/58  Pulse: 69  Resp: 16  Temp: 97.7 F (36.5 C)  TempSrc: Temporal  SpO2: 96%  PainSc: 0-No pain    LUNGS: clear to auscultation  CARDIAC: Cor RRR EXTREMITIES: Mild swelling to right hand and right  ankle SKIN:  Small fluid filled growth noted to outer aspect of right thigh, exposed skin is dry and intact   NEURO:  Alert, but non-verbal throughout visit, pleasant mood and smiling, hx of TIAs, generalized weakness, ambulatory w/walker and 1 person assistance    (Duration of visit and documentation 60 minutes)   Daryl Eastern, RN BSN

## 2021-08-14 NOTE — Progress Notes (Signed)
Carelink Summary Report / Loop Recorder 

## 2021-08-15 ENCOUNTER — Other Ambulatory Visit: Payer: Self-pay | Admitting: Internal Medicine

## 2021-08-15 DIAGNOSIS — I6310 Cerebral infarction due to embolism of unspecified precerebral artery: Secondary | ICD-10-CM

## 2021-08-31 ENCOUNTER — Ambulatory Visit (INDEPENDENT_AMBULATORY_CARE_PROVIDER_SITE_OTHER): Payer: Medicare PPO

## 2021-08-31 DIAGNOSIS — I6310 Cerebral infarction due to embolism of unspecified precerebral artery: Secondary | ICD-10-CM

## 2021-09-01 LAB — CUP PACEART REMOTE DEVICE CHECK
Date Time Interrogation Session: 20220910005404
Implantable Pulse Generator Implant Date: 20210322

## 2021-09-04 NOTE — Progress Notes (Signed)
Carelink Summary Report / Loop Recorder 

## 2021-09-10 ENCOUNTER — Other Ambulatory Visit: Payer: Self-pay | Admitting: Cardiology

## 2021-09-10 DIAGNOSIS — I6310 Cerebral infarction due to embolism of unspecified precerebral artery: Secondary | ICD-10-CM

## 2021-09-14 ENCOUNTER — Other Ambulatory Visit: Payer: Self-pay

## 2021-09-14 ENCOUNTER — Other Ambulatory Visit: Payer: Medicare PPO

## 2021-09-14 ENCOUNTER — Other Ambulatory Visit: Payer: Medicare PPO | Admitting: *Deleted

## 2021-09-14 DIAGNOSIS — Z515 Encounter for palliative care: Secondary | ICD-10-CM

## 2021-09-14 NOTE — Progress Notes (Signed)
COMMUNITY PALLIATIVE CARE SW NOTE  PATIENT NAME: Sheila Parrish DOB: 1927/05/22 MRN: [id number]  PRIMARY CARE PROVIDER: Lavone Orn, MD  RESPONSIBLE PARTY:  Acct ID - Guarantor Home Phone Work Phone Relationship Acct Type  [id number] Sheila [phone]  Self P/F     [place], [place], [place] [address]     PLAN OF CARE and INTERVENTIONS:             GOALS OF CARE/ ADVANCE CARE PLANNING:  Goals is for patient  to remain in her apartment. Patient is a DNR.  SOCIAL/EMOTIONAL/SPIRITUAL ASSESSMENT/ INTERVENTIONS:  SW and RN- Sheila Parrish completed a joint follow-up visit with patient at her independent apartment at [facility]. Patient was present with her sitter-Sheila Parrish. Patient was sitting at the dinning room table in her wheelchair. Her aide was sitting with her and encouraging her to eat her breakfast. Patient was verbally responsive to simple yes/no questions. She denied pain. Her sitter provided an update on patient status. The sitter report that patient is having difficulty bending her  left knee and it occasionally appears to lock-up on her. Patient is having intermittent difficulty getting up from her raised toilet seat due to the pain in her legs. It is thought to be the hardware in her knee replacement surgery that is causing the issues. Physical therapy ended about a month ago for patient. The second shift caregiver reported that patient seemed to be having an "off day" yesterday, but this is contributed to patient's dementia and changes in caregiver schedules. Patient is having more pain and aching to her knees and legs that is treated with Tylenol, creams and Biofreeze. Weight loss is also noted with patient as her clothes are looser and she appears thinner throughout her extremities. Patient's appetite has declined significantly and her intake varies from day to day. Patient continues to take her medications crushed in applesauce and she is taking one Ensure a day.  Patient is having  increased choking episodes on food, despite giving patient smaller portions of food. It is now taking patient at least a hour and half, whereas it was taking an hour before. Patent received her flu shot on 08/27/21. Patient continues to have 24 hour sitters. Patient is dependent for all ADL's, however her sitter does encourage her feed herself.  Patient/caregiver remain open to ongoing palliative care support. PATIENT/CAREGIVER EDUCATION/ COPING:  Patient appears to be coping well. Her daughter remains supportive. PERSONAL EMERGENCY PLAN:  Per facility protocol.  COMMUNITY RESOURCES COORDINATION/ HEALTH CARE NAVIGATION:  Patient has 24 hour sitters through [facility]. FINANCIAL/LEGAL CONCERNS/INTERVENTIONS:  None.     SOCIAL HX:  Social History   Tobacco Use   Smoking status: Never   Smokeless tobacco: Never  Substance Use Topics   Alcohol use: No    CODE STATUS: DNR ADVANCED DIRECTIVES: Yes MOST FORM COMPLETE: Yes HOSPICE EDUCATION PROVIDED: No  PPS: Patient is alert and oriented to self and situation. She is dependent for all personal care needs.   Duration of visit and documentation: 60 minutes  Sheila Puller, LCSW

## 2021-09-28 ENCOUNTER — Ambulatory Visit (INDEPENDENT_AMBULATORY_CARE_PROVIDER_SITE_OTHER): Payer: Medicare PPO

## 2021-09-28 DIAGNOSIS — R55 Syncope and collapse: Secondary | ICD-10-CM | POA: Diagnosis not present

## 2021-09-29 LAB — CUP PACEART REMOTE DEVICE CHECK
Date Time Interrogation Session: 20221013005438
Implantable Pulse Generator Implant Date: 20210322

## 2021-10-01 DIAGNOSIS — R278 Other lack of coordination: Secondary | ICD-10-CM | POA: Diagnosis not present

## 2021-10-01 DIAGNOSIS — M6281 Muscle weakness (generalized): Secondary | ICD-10-CM | POA: Diagnosis not present

## 2021-10-01 DIAGNOSIS — R262 Difficulty in walking, not elsewhere classified: Secondary | ICD-10-CM | POA: Diagnosis not present

## 2021-10-03 ENCOUNTER — Other Ambulatory Visit: Payer: Self-pay | Admitting: Cardiology

## 2021-10-03 DIAGNOSIS — I6310 Cerebral infarction due to embolism of unspecified precerebral artery: Secondary | ICD-10-CM

## 2021-10-05 ENCOUNTER — Telehealth: Payer: Self-pay | Admitting: Cardiology

## 2021-10-05 DIAGNOSIS — R262 Difficulty in walking, not elsewhere classified: Secondary | ICD-10-CM | POA: Diagnosis not present

## 2021-10-05 DIAGNOSIS — R278 Other lack of coordination: Secondary | ICD-10-CM | POA: Diagnosis not present

## 2021-10-05 DIAGNOSIS — M6281 Muscle weakness (generalized): Secondary | ICD-10-CM | POA: Diagnosis not present

## 2021-10-05 NOTE — Telephone Encounter (Signed)
Spoke with the patient's daughter who would like to do a virtual visit with Dr. Radford Pax because her mom has trouble getting around. Patient has been scheduled for a virtual visit.

## 2021-10-05 NOTE — Telephone Encounter (Signed)
Patient's daughter was calling in to setup a virtual appt for her mother. Explain that dr turner doesn't have any of those anymore, but looking to see if it can be exception for the patient. Please advise

## 2021-10-06 NOTE — Progress Notes (Signed)
Carelink Summary Report / Loop Recorder 

## 2021-10-07 DIAGNOSIS — M6281 Muscle weakness (generalized): Secondary | ICD-10-CM | POA: Diagnosis not present

## 2021-10-07 DIAGNOSIS — R262 Difficulty in walking, not elsewhere classified: Secondary | ICD-10-CM | POA: Diagnosis not present

## 2021-10-07 DIAGNOSIS — R278 Other lack of coordination: Secondary | ICD-10-CM | POA: Diagnosis not present

## 2021-10-12 ENCOUNTER — Other Ambulatory Visit: Payer: Self-pay | Admitting: Cardiology

## 2021-10-12 DIAGNOSIS — I6310 Cerebral infarction due to embolism of unspecified precerebral artery: Secondary | ICD-10-CM

## 2021-10-12 DIAGNOSIS — M6281 Muscle weakness (generalized): Secondary | ICD-10-CM | POA: Diagnosis not present

## 2021-10-12 DIAGNOSIS — R278 Other lack of coordination: Secondary | ICD-10-CM | POA: Diagnosis not present

## 2021-10-12 DIAGNOSIS — R262 Difficulty in walking, not elsewhere classified: Secondary | ICD-10-CM | POA: Diagnosis not present

## 2021-10-13 ENCOUNTER — Other Ambulatory Visit: Payer: Medicare PPO | Admitting: *Deleted

## 2021-10-13 ENCOUNTER — Other Ambulatory Visit: Payer: Self-pay

## 2021-10-13 ENCOUNTER — Other Ambulatory Visit: Payer: Medicare PPO

## 2021-10-13 VITALS — BP 124/71 | HR 67 | Temp 97.6°F | Resp 16

## 2021-10-13 DIAGNOSIS — Z515 Encounter for palliative care: Secondary | ICD-10-CM

## 2021-10-13 NOTE — Progress Notes (Signed)
COMMUNITY PALLIATIVE CARE SW NOTE  PATIENT NAME: Sheila Parrish DOB: 1927-06-06 MRN: [id number]  PRIMARY CARE PROVIDER: Lavone Orn, MD  RESPONSIBLE PARTY:  Acct ID - Guarantor Home Phone Work Phone Relationship Acct Type  [id number] [name]  Self P/F     [place], [place], [place] [address]     PLAN OF CARE and INTERVENTIONS:             GOALS OF CARE/ ADVANCE CARE PLANNING:  Goal is for patient to remain in her apartment. Patient is a DNR.  SOCIAL/EMOTIONAL/SPIRITUAL ASSESSMENT/ INTERVENTIONS:  SW and RN-M. Nadara Mustard completed a joint follow-up visit with patient at her apartment where she was present with her daughter and sitter-[name]. Patient was was sitting in her wheelchair. She did not appear to be in any pain or discomfort. Patient was verbally responsive to simple yes/no questions. Patient appeared to be tired as she put her head in her hands several times and closed her eyes. Patient continues to have pain and difficulty bending and stretching her legs. Patient has restarted physical therapy a week ago. She will have physical therapy 3 x/week for 8 weeks with the focus on strengthening, walking, and transfers. On October 10 th, patient tested positive for COVID. Since recovering for COVID, patient is having increased weakness, fatigue, napping more and needs more help with her transfers. Her sitters are having more difficulty getting her out of bed and toileting her. Her appetite has decreased and some weight loss is suspected due to COVID, but there are no recent weights available. Her sitters have difficulty brushing patient's teeth. Patient still have some swallowing issues, but the staff is ordering more softer foods for her. Her medications are crushed and administered with Ensure or applesauce. Patient continues to have 24 hour sitters. She also remains dependent for all ADL's. Patient/family remain open to ongoing visits/support by the team.  PATIENT/CAREGIVER  EDUCATION/ COPING:  Patient appears to be coping well. PERSONAL EMERGENCY PLAN:  Per facility protocol. COMMUNITY RESOURCES COORDINATION/ HEALTH CARE NAVIGATION:  Patient has 24 hour sitters through [facility]. FINANCIAL/LEGAL CONCERNS/INTERVENTIONS: None     SOCIAL HX:  Social History   Tobacco Use   Smoking status: Never   Smokeless tobacco: Never  Substance Use Topics   Alcohol use: No    CODE STATUS: DNR ADVANCED DIRECTIVES: Yes MOST FORM COMPLETE:  Yes HOSPICE EDUCATION PROVIDED: No  PPS:  Patient is alert and oriented to self and situation. She is dependent for all personal care needs.   Duration of visit and documentation: 60 minutes  Katheren Puller, LCSW

## 2021-10-13 NOTE — Progress Notes (Signed)
[facility] PALLIATIVE CARE RN NOTE  PATIENT NAME: Sheila Parrish DOB: 1927-07-28 MRN: [id number]  PRIMARY CARE PROVIDER: Lavone Orn, MD  RESPONSIBLE PARTY:  Acct ID - Guarantor Home Phone Work Phone Relationship Acct Type  [id number] [name]  Self P/F     [place], [place] [address]   Covid-19 Pre-screening Negative  PLAN OF CARE and INTERVENTION:  ADVANCE CARE PLANNING/GOALS OF CARE: Goal is for patient to remain at [facility] in her apartment with 24/7 caregivers. Daughter/caregiver also wants her to get stronger. PATIENT/CAREGIVER EDUCATION: Symptom management, safe mobility/transfers DISEASE STATUS: Joint follow-up palliative care visit made with LCSW, M. Lonon. Daughter and hired caregiver present during visit. Caregiver reports that yesterday patient went to the facilities [place]. She sat on the 2nd floor in her wheelchair and was able to look down to the 1st level to observe and seemed to enjoy this. Daughter reports that on September 21, 2021 she had another episode of her blood pressure dropping very low and she almost passed out. EMS was contacted. Patient was assessed by EMS and a Covid test was taken which came back positive for Covid. She started experiencing chills a few days later. She was so weak that the caregivers could not get patient out of bed. They noticed that her strength had not started to come back so PT was initiated for 8 weeks. She is requiring more assistance with transferring. PT is working with her 3x/week.  Therapy started last week through [facility] who is based out of the facility. They have been working on stretching out her right leg from her past knee replacement to get it more mobile. They also have been walking her. Caregivers are also having a difficult time getting her off the toilet. Her appetite has decreased some. They are noticing that she coughs more during meals. They are ordering her softer foods  to help. She appears thinner overall. She is going to have therapy to weigh her next session. She continues to have some stiffness and swelling in her right hand, however no swelling noticed today. She is swallowing her pills ok. Sometimes they are given from placing it in her Ensure. Other times it is given crushed in applesauce. She clenches her mouth together at times during medication administration. She denies pain during visit today but caregiver says when they stretch her out she tends to limp a little more the next day. They continue to apply Biofreeze and ice to help. She has been taking more naps since Covid. She appears more alert when engaged with activities. Patient is sitting up in her wheelchair throughout visit, but closed her eyes on and off. Other times she would laugh and make eye contact. She is minimally verbal. Will continue to monitor.  HISTORY OF PRESENT ILLNESS:  This is a 87 yo female with a history of CVA, syncope and orthostatic dizziness. Palliative care team continues to follow patient for goals of care, symptom management and complex decision making.  CODE STATUS: DNR ADVANCED DIRECTIVES: Y MOST FORM: yes PPS: 50%   PHYSICAL EXAM:   VITALS: Today's Vitals   10/13/21 1412  BP: 124/71  Pulse: 67  Resp: 16  Temp: 97.6 F (36.4 C)  TempSrc: Temporal  SpO2: 95%    LUNGS: clear to auscultation  CARDIAC: Cor RRR EXTREMITIES: No edema SKIN: Small fluid filled growth noted to outer aspect of right thigh, exposed skin is dry and intact    NEURO:  Alert, minimally  verbal, increased generalized weakness, ambulatory w/walker and 1 person assistance   (Duration of visit and documentation 45 minutes)   Daryl Eastern, RN BSN

## 2021-10-14 DIAGNOSIS — R278 Other lack of coordination: Secondary | ICD-10-CM | POA: Diagnosis not present

## 2021-10-14 DIAGNOSIS — R262 Difficulty in walking, not elsewhere classified: Secondary | ICD-10-CM | POA: Diagnosis not present

## 2021-10-14 DIAGNOSIS — M6281 Muscle weakness (generalized): Secondary | ICD-10-CM | POA: Diagnosis not present

## 2021-10-17 DIAGNOSIS — R278 Other lack of coordination: Secondary | ICD-10-CM | POA: Diagnosis not present

## 2021-10-17 DIAGNOSIS — R262 Difficulty in walking, not elsewhere classified: Secondary | ICD-10-CM | POA: Diagnosis not present

## 2021-10-17 DIAGNOSIS — M6281 Muscle weakness (generalized): Secondary | ICD-10-CM | POA: Diagnosis not present

## 2021-10-19 DIAGNOSIS — M6281 Muscle weakness (generalized): Secondary | ICD-10-CM | POA: Diagnosis not present

## 2021-10-19 DIAGNOSIS — R262 Difficulty in walking, not elsewhere classified: Secondary | ICD-10-CM | POA: Diagnosis not present

## 2021-10-19 DIAGNOSIS — R278 Other lack of coordination: Secondary | ICD-10-CM | POA: Diagnosis not present

## 2021-10-21 DIAGNOSIS — M6281 Muscle weakness (generalized): Secondary | ICD-10-CM | POA: Diagnosis not present

## 2021-10-21 DIAGNOSIS — R278 Other lack of coordination: Secondary | ICD-10-CM | POA: Diagnosis not present

## 2021-10-21 DIAGNOSIS — R262 Difficulty in walking, not elsewhere classified: Secondary | ICD-10-CM | POA: Diagnosis not present

## 2021-10-27 DIAGNOSIS — M6281 Muscle weakness (generalized): Secondary | ICD-10-CM | POA: Diagnosis not present

## 2021-10-27 DIAGNOSIS — R262 Difficulty in walking, not elsewhere classified: Secondary | ICD-10-CM | POA: Diagnosis not present

## 2021-10-27 DIAGNOSIS — R278 Other lack of coordination: Secondary | ICD-10-CM | POA: Diagnosis not present

## 2021-10-29 DIAGNOSIS — M6281 Muscle weakness (generalized): Secondary | ICD-10-CM | POA: Diagnosis not present

## 2021-10-29 DIAGNOSIS — R278 Other lack of coordination: Secondary | ICD-10-CM | POA: Diagnosis not present

## 2021-10-29 DIAGNOSIS — R262 Difficulty in walking, not elsewhere classified: Secondary | ICD-10-CM | POA: Diagnosis not present

## 2021-10-30 DIAGNOSIS — M6281 Muscle weakness (generalized): Secondary | ICD-10-CM | POA: Diagnosis not present

## 2021-10-30 DIAGNOSIS — R278 Other lack of coordination: Secondary | ICD-10-CM | POA: Diagnosis not present

## 2021-10-30 DIAGNOSIS — R262 Difficulty in walking, not elsewhere classified: Secondary | ICD-10-CM | POA: Diagnosis not present

## 2021-11-02 ENCOUNTER — Ambulatory Visit (INDEPENDENT_AMBULATORY_CARE_PROVIDER_SITE_OTHER): Payer: Medicare PPO

## 2021-11-02 DIAGNOSIS — R55 Syncope and collapse: Secondary | ICD-10-CM

## 2021-11-03 DIAGNOSIS — M6281 Muscle weakness (generalized): Secondary | ICD-10-CM | POA: Diagnosis not present

## 2021-11-03 DIAGNOSIS — R278 Other lack of coordination: Secondary | ICD-10-CM | POA: Diagnosis not present

## 2021-11-03 DIAGNOSIS — R262 Difficulty in walking, not elsewhere classified: Secondary | ICD-10-CM | POA: Diagnosis not present

## 2021-11-03 LAB — CUP PACEART REMOTE DEVICE CHECK
Date Time Interrogation Session: 20221114235523
Implantable Pulse Generator Implant Date: 20210322

## 2021-11-04 DIAGNOSIS — R262 Difficulty in walking, not elsewhere classified: Secondary | ICD-10-CM | POA: Diagnosis not present

## 2021-11-04 DIAGNOSIS — M6281 Muscle weakness (generalized): Secondary | ICD-10-CM | POA: Diagnosis not present

## 2021-11-04 DIAGNOSIS — R278 Other lack of coordination: Secondary | ICD-10-CM | POA: Diagnosis not present

## 2021-11-06 DIAGNOSIS — M6281 Muscle weakness (generalized): Secondary | ICD-10-CM | POA: Diagnosis not present

## 2021-11-06 DIAGNOSIS — R278 Other lack of coordination: Secondary | ICD-10-CM | POA: Diagnosis not present

## 2021-11-06 DIAGNOSIS — R262 Difficulty in walking, not elsewhere classified: Secondary | ICD-10-CM | POA: Diagnosis not present

## 2021-11-09 DIAGNOSIS — R262 Difficulty in walking, not elsewhere classified: Secondary | ICD-10-CM | POA: Diagnosis not present

## 2021-11-09 DIAGNOSIS — M6281 Muscle weakness (generalized): Secondary | ICD-10-CM | POA: Diagnosis not present

## 2021-11-09 DIAGNOSIS — R278 Other lack of coordination: Secondary | ICD-10-CM | POA: Diagnosis not present

## 2021-11-10 NOTE — Progress Notes (Signed)
Carelink Summary Report / Loop Recorder 

## 2021-11-12 ENCOUNTER — Telehealth: Payer: Self-pay | Admitting: Cardiology

## 2021-11-12 NOTE — Telephone Encounter (Signed)
Called patient to reschedule appt on 11/18/21 with Dr. Radford Pax due to Dr. Radford Pax being out next week. Spoke to the daughter who stated that her mother really needs a virtual appointment, it can be with anyone. She states that her mother has gone into assisted living this week and is under hospice care and she would like to discuss a few things. She also states that the patient see's Dr. Rayann Heman and her device has not been on since Monday due to the move. I offered her a virtual with Dr. Radford Pax in January but she would really like it done this month beings that she has a lot of question's she needs to discuss with someone. Please advise if we can schedule a virtual at another office with an APP. No openings at [facility] with an APP until February.

## 2021-11-12 NOTE — Telephone Encounter (Signed)
Spoke to daughter, she cannot do today. She states that she is cleaning and closing out her mother's apartment and will not have time to get to where her mother is living now.

## 2021-11-16 NOTE — Telephone Encounter (Signed)
Patient is scheduled with Laurann Montana, NP on 12/9

## 2021-11-16 NOTE — Progress Notes (Signed)
[facility] PALLIATIVE CARE RN NOTE  PATIENT NAME: Sheila Parrish DOB: 06/12/1927 MRN: [id number]  PRIMARY CARE PROVIDER: Lavone Orn, MD  RESPONSIBLE PARTY:  Acct ID - Guarantor Home Phone Work Phone Relationship Acct Type  [id number] [name]  Self P/F     [address], [place], [place] [address]   Covid-19 Pre-screening Negative  PLAN OF CARE and INTERVENTION:  ADVANCE CARE PLANNING/GOALS OF CARE: Goal is for patient to remain in her home with 24/7 caregivers. She has a DNR. PATIENT/CAREGIVER EDUCATION: Symptom management, safe mobility/transfers DISEASE STATUS: Joint visit made with LCSW, M. Lonon. Met with patient and her hired caregiver, Ermalinda Barrios, in her apartment. Upon arrival, patient is sitting up at her dining table finishing up her breakfast. Caregiver continues to encourage/prompt patient to eat. It is taking her longer to complete meals, 90 minutes vs 60 minutes. She is also coughing/choking more during meals despite cutting up patient's food to finer pieces and softer diet. She takes her medications crushed in applesauce. She is drinking Ensure once daily for nutritional supplementation. Apparent muscle wasting and weight loss noted as her clothes are fitting more loosely. She does experience pain in her left knee when standing. She had a knee replacement in the past and feels the hardware for this is causing her to be unable to fully extend her leg. They do apply Biofreeze and ice to her knee daily. She is having more difficulties standing from the bedside commode. She completed physical therapy about a month ago. Patient remained quiet throughout much of visit. Only gives short replies to questions asked. Will continue to monitor.  HISTORY OF PRESENT ILLNESS:  This is a 87 yo female with a history of CVA, syncope and orthostatic dizziness. Palliative care team continues to follow patient for goals of care, symptom management and complex decision making.  CODE  STATUS: DNR ADVANCED DIRECTIVES: Y MOST FORM: yes PPS: 50%   PHYSICAL EXAM:   LUNGS: clear to auscultation  CARDIAC: Cor RRR EXTREMITIES: No edema noted today SKIN:  Exposed skin is dry and intact   NEURO:  Alert, but minimall verbal during visit, pleasant mood, increased generalized weakness, ambulatory w/walker and 1 person assistance   (Duration of visit and documentation 60 minutes)    Daryl Eastern, RN BSN

## 2021-11-17 ENCOUNTER — Telehealth: Payer: Self-pay | Admitting: Cardiology

## 2021-11-17 DIAGNOSIS — M6281 Muscle weakness (generalized): Secondary | ICD-10-CM | POA: Diagnosis not present

## 2021-11-17 DIAGNOSIS — R278 Other lack of coordination: Secondary | ICD-10-CM | POA: Diagnosis not present

## 2021-11-17 DIAGNOSIS — R262 Difficulty in walking, not elsewhere classified: Secondary | ICD-10-CM | POA: Diagnosis not present

## 2021-11-17 NOTE — Telephone Encounter (Signed)
Called pts. Daughter per DPR and got video visit changed to a televisit and got time updated.

## 2021-11-17 NOTE — Telephone Encounter (Signed)
Pts daughter would like to change upcoming visit to a televisit.. she doesn't think it is necessary to do a video visit since pt is non verbal and just has a few questions she needs answered... please advise

## 2021-11-18 ENCOUNTER — Telehealth: Payer: Medicare PPO | Admitting: Cardiology

## 2021-11-19 NOTE — Progress Notes (Signed)
Virtual Visit via Telephone Note   This visit type was conducted due to national recommendations for restrictions regarding the COVID-19 Pandemic (e.g. social distancing) in an effort to limit this patient's exposure and mitigate transmission in our community.  Due to her co-morbid illnesses, this patient is at least at moderate risk for complications without adequate follow up.  This format is felt to be most appropriate for this patient at this time.  The patient did not have access to video technology/had technical difficulties with video requiring transitioning to audio format only (telephone).  All issues noted in this document were discussed and addressed.  No physical exam could be performed with this format.  Please refer to the patient's chart for her  consent to telehealth for [facility].   Date:  11/20/2021   ID:  Sheila Parrish, DOB [birth year], MRN [id number] The patient was identified using 2 identifiers.  Patient Location: Home Provider Location: Office/Clinic  PCP:  Lavone Orn, MD   [facility] Providers Cardiologist:  Fransico Him, MD     Evaluation Performed:  Follow-Up Visit  Chief Complaint:  Follow up of orthostatic hypotension  History of Present Illness:    Sheila Parrish is a 87 y.o. female with history of orthostatic hypotension, CVA, syncope s/p ILR, HLD  Most recent echo 04/2020 LVEF 65-70%, no RWMA, gr1DD, mildly elevated PASP, mild MR, mild to moderate aortic valve sclerosis without stenosis, RA 87mmHg.   Last seen via video visit 08/14/20 by Dr. Radford Parrish, Strength was improving with PT and working with hospice nurse on balance. BP was good without dizziness. She was wearing compression socks.   She tested positive for COVID-19 on 09/21/21 and per palliative care notes had increased weakness, fatigue since that time. She does have 24 hour sitters through [facility].   Follow up today via phone visit facilitated by her daughter. ILR reports  over the last year with no arrhythmias. She moved into [facility] at [facility] on November 28th. She also qualified for [facility]. They were able to bring her day shift caregivers with her which has helped with transition. Has a hypotensive episode every 6-8 weeks. Last episode October 10th.  Reports orthostatic hypotension symptoms overall well controlled on current dose of 30 mg Pyridostigmine twice daily.  We discussed that if episodes occur more often could consider further increasing dose.  Notes often episodes occur with sitting on the toilet and we reviewed vasovagal lightheadedness and precautions.  Daughter wonders whether there is utility in continuing ILR remote monitoring given hospice care.  Past Medical History:  Diagnosis Date   High cholesterol    Scarlet fever 1936   Hospitalized for a month   Stroke (cerebrum) ([place])    Syncope    Past Surgical History:  Procedure Laterality Date   ABDOMINAL HYSTERECTOMY     implantable loop recorder placement  03/03/2020   Medtronic Reveal Linq model [place] 11(SN [id number]) implanted for cryptogenic stroke   TOTAL KNEE ARTHROPLASTY Left 2010     Current Meds  Medication Sig   acetaminophen (TYLENOL) 500 MG tablet Take 500 mg by mouth every 6 (six) hours as needed for headache (pain).   Menthol, Topical Analgesic, (MINERAL ICE EX) Apply 1 application topically as needed.    polyethylene glycol (MIRALAX / GLYCOLAX) 17 g packet Take 17 g by mouth as needed for moderate constipation.   [DISCONTINUED] clopidogrel (PLAVIX) 75 MG tablet Take 1 tablet (75 mg total) by mouth daily.   [  DISCONTINUED] pantoprazole (PROTONIX) 40 MG tablet TAKE 1 TABLET BY MOUTH EVERY DAY   [DISCONTINUED] pyridostigmine (MESTINON) 60 MG tablet Take 0.5 tablets (30 mg total) by mouth every 12 (twelve) hours. Please make yearly appt with Dr. Radford Parrish for September 2022 for future refills. Thank you 1st attempt     Allergies:   Donepezil, Alendronate,  Atorvastatin, Bactrim [sulfamethoxazole-trimethoprim], Macrodantin [nitrofurantoin macrocrystal], Simvastatin, Sulfa antibiotics, and Azithromycin   Social History   Tobacco Use   Smoking status: Never   Smokeless tobacco: Never  Vaping Use   Vaping Use: Never used  Substance Use Topics   Alcohol use: No   Drug use: No     Family Hx: The patient's family history includes Arthritis in her mother; Stroke (age of onset: 19) in her father. There is no history of CAD.  ROS:   Please see the history of present illness.     All other systems reviewed and are negative.   Prior CV studies:   The following studies were reviewed today:  Echocardiogram 05/03/2020 EF 65-70, normal wall motion, grade 1 diastolic dysfunction, normal RV SF, RVSP 37.8, mild LAE, mild MR, mild AI   Myoview 05/19/2017 EF 66, no ischemia or infarction, normal study    Labs/Other Tests and Data Reviewed:    EKG:  No ECG reviewed.  Recent Labs: No results found for requested labs within last 8760 hours.   Recent Lipid Panel Lab Results  Component Value Date/Time   CHOL 214 (H) 02/29/2020 03:33 AM   TRIG 77 02/29/2020 03:33 AM   HDL 32 (L) 02/29/2020 03:33 AM   CHOLHDL 6.7 02/29/2020 03:33 AM   LDLCALC 167 (H) 02/29/2020 03:33 AM    Wt Readings from Last 3 Encounters:  11/20/21 112 lb (50.8 kg)  06/06/20 114 lb (51.7 kg)  05/22/20 112 lb (50.8 kg)        Objective:    Vital Signs:  BP 113/63   Pulse 69   Ht 4\' 11"  (1.499 m)   Wt 112 lb (50.8 kg)   BMI 22.62 kg/m    VITAL SIGNS:  reviewed  ASSESSMENT & PLAN:    Orthostatic hypotension -symptoms overall controlled on Pyridostigmine 30 mg twice daily.  Encouraged to continue fluid intake, liberalize salt in diet, compression stockings, abdominal binder, slow position changes.  We did discuss that if symptoms worsen could consider further increasing dose.  We have sent Rx to preferred pharmacy by ALF.  ILR - Placed 03/03/20 by Dr. Rayann Heman due  to hx of CVA. No arrhythmias noted on device checks since implant. Given she is proceeding with hospice care daughter wonders whether ILR monitoring could be stopped. This is reasonable. Daughter expressed that even if monitor showed atrial fib they wouldn't proceed with additional interventions and do not wish to have ILR removed, just cease monitoring.   Hx of GI bleed - Continue Protonix due to need for Plavix with hx of CVA.  We have sent Rx to preferred pharmacy by ALF.  Reports no hematuria no melena.  Hx of CVA -continue Plavix.  We have sent Rx to preferred pharmacy by ALF.  Denies bleeding complications.  Known residual deficit of not speaking much but this is unchanged over the past few years.     Time:   Today, I have spent 28 minutes with the patient with telehealth technology discussing the above problems.    Medication Adjustments/Labs and Tests Ordered: Current medicines are reviewed at length with the patient today.  Concerns  regarding medicines are outlined above.   Tests Ordered: No orders of the defined types were placed in this encounter.   Medication Changes: Meds ordered this encounter  Medications   pyridostigmine (MESTINON) 60 MG tablet    Sig: Take 0.5 tablets (30 mg total) by mouth every 12 (twelve) hours.    Dispense:  90 tablet    Refill:  3    Please call our office to schedule an yearly appointment with Dr. Radford Parrish for September 2022 before anymore refills. [phone]. Thank you 1st attempt    Order Specific Question:   Supervising Provider    Answer:   Michaelle Birks [[id number]]   pantoprazole (PROTONIX) 40 MG tablet    Sig: Take 1 tablet (40 mg total) by mouth daily.    Dispense:  90 tablet    Refill:  3    Order Specific Question:   Supervising Provider    Answer:   William Dalton   clopidogrel (PLAVIX) 75 MG tablet    Sig: Take 1 tablet (75 mg total) by mouth daily.    Dispense:  90 tablet    Refill:  3    Order Specific Question:    Supervising Provider    Answer:   Michaelle Birks [[id number]]    Follow Up:  In Person in 1 year(s) or sooner as needed  Signed, Loel Dubonnet, NP  11/20/2021 4:34 PM    [facility]

## 2021-11-20 ENCOUNTER — Encounter (HOSPITAL_BASED_OUTPATIENT_CLINIC_OR_DEPARTMENT_OTHER): Payer: Self-pay | Admitting: Family

## 2021-11-20 ENCOUNTER — Telehealth (HOSPITAL_BASED_OUTPATIENT_CLINIC_OR_DEPARTMENT_OTHER): Payer: Medicare PPO | Admitting: Family

## 2021-11-20 ENCOUNTER — Ambulatory Visit (INDEPENDENT_AMBULATORY_CARE_PROVIDER_SITE_OTHER): Payer: Medicare PPO | Admitting: Family

## 2021-11-20 VITALS — BP 113/63 | HR 69 | Ht 59.0 in | Wt 112.0 lb

## 2021-11-20 DIAGNOSIS — Z8673 Personal history of transient ischemic attack (TIA), and cerebral infarction without residual deficits: Secondary | ICD-10-CM

## 2021-11-20 DIAGNOSIS — Z8719 Personal history of other diseases of the digestive system: Secondary | ICD-10-CM | POA: Diagnosis not present

## 2021-11-20 DIAGNOSIS — I951 Orthostatic hypotension: Secondary | ICD-10-CM

## 2021-11-20 MED ORDER — PYRIDOSTIGMINE BROMIDE 60 MG PO TABS: 30.0000 mg | ORAL_TABLET | Freq: Two times a day (BID) | ORAL | 3 refills | Status: AC

## 2021-11-20 MED ORDER — CLOPIDOGREL BISULFATE 75 MG PO TABS: 75.0000 mg | ORAL_TABLET | Freq: Every day | ORAL | 3 refills | Status: DC

## 2021-11-20 MED ORDER — PANTOPRAZOLE SODIUM 40 MG PO TBEC: 40.0000 mg | DELAYED_RELEASE_TABLET | Freq: Every day | ORAL | 3 refills | Status: AC

## 2021-11-20 NOTE — Patient Instructions (Addendum)
Medication Instructions:  We have sent refills of Clopidogrel, Protonix, and Pyridostigmine to [facility].  This is the pharmacy that [facility] asked [place] to send medications to.   *If you need a refill on your cardiac medications before your next appointment, please call your pharmacy*  Lab Work: None ordered today.   Testing/Procedures: None ordered today.   Follow-Up: At [facility], you and your health needs are our priority.  As part of our continuing mission to provide you with exceptional heart care, we have created designated Provider Care Teams.  These Care Teams include your primary Cardiologist (physician) and Advanced Practice Providers (APPs -  Physician Assistants and Nurse Practitioners) who all work together to provide you with the care you need, when you need it.  We recommend signing up for the patient portal called "MyChart".  Sign up information is provided on this After Visit Summary.  MyChart is used to connect with patients for Virtual Visits (Telemedicine).  Patients are able to view lab/test results, encounter notes, upcoming appointments, etc.  Non-urgent messages can be sent to your provider as well.   To learn more about what you can do with MyChart, go to [email or web address].    Your next appointment:   1 year or sooner as needed with Dr. Radford Pax or Advanced Practice Provider   Other Instructions  Continue orthostatic hypotension precautions: Make position changes slowly. Liberalize salt in diet. Increase fluid intake. Wear compression socks and/or abdominal binder.   Of note, having a bowel movement or straining can trigger vagal nerve which lowers blood pressure. Recommend monitoring carefully for signs of constipation and avoiding with stool softeners and laxatives as needed.   If episodes of hypotension start occurring more often, we can consider further increasing the dose of Pyridostigmine. Simply call and let us know. Sheila Parrish  currently takes 30mg  twice daily and it could be increased as high as 60mg  three times per day.   Loel Dubonnet, NP will reach out to Dr. Rayann Heman about stopping monitoring your loop recorder.

## 2021-11-23 DIAGNOSIS — R278 Other lack of coordination: Secondary | ICD-10-CM | POA: Diagnosis not present

## 2021-11-23 DIAGNOSIS — R262 Difficulty in walking, not elsewhere classified: Secondary | ICD-10-CM | POA: Diagnosis not present

## 2021-11-23 DIAGNOSIS — M6281 Muscle weakness (generalized): Secondary | ICD-10-CM | POA: Diagnosis not present

## 2021-11-24 DIAGNOSIS — R278 Other lack of coordination: Secondary | ICD-10-CM | POA: Diagnosis not present

## 2021-11-24 DIAGNOSIS — M6281 Muscle weakness (generalized): Secondary | ICD-10-CM | POA: Diagnosis not present

## 2021-11-24 DIAGNOSIS — R262 Difficulty in walking, not elsewhere classified: Secondary | ICD-10-CM | POA: Diagnosis not present

## 2021-12-01 DIAGNOSIS — R262 Difficulty in walking, not elsewhere classified: Secondary | ICD-10-CM | POA: Diagnosis not present

## 2021-12-01 DIAGNOSIS — M6281 Muscle weakness (generalized): Secondary | ICD-10-CM | POA: Diagnosis not present

## 2021-12-01 DIAGNOSIS — R278 Other lack of coordination: Secondary | ICD-10-CM | POA: Diagnosis not present

## 2021-12-01 LAB — CUP PACEART REMOTE DEVICE CHECK
Date Time Interrogation Session: 20221217235921
Implantable Pulse Generator Implant Date: 20210322

## 2021-12-02 ENCOUNTER — Telehealth (HOSPITAL_BASED_OUTPATIENT_CLINIC_OR_DEPARTMENT_OTHER): Payer: Self-pay

## 2021-12-02 NOTE — Telephone Encounter (Signed)
Called pts. Daughter [name], ok per DPR, and no answer. Left a voicemail with instructions about stopping the loop recorder and returning. Also informed patient they could reach me at [phone] if they had any questions!     "Hello!   Can we please call Miss Laviolette's daughter and let her know that Dr. Rayann Heman agreed it would be appropriate to stop monitoring her loop recorder? The device team is sending her a return kit for her home device and future remote transmissions have been cancelled.   Loel Dubonnet, NP "

## 2021-12-04 DIAGNOSIS — M6281 Muscle weakness (generalized): Secondary | ICD-10-CM | POA: Diagnosis not present

## 2021-12-04 DIAGNOSIS — R262 Difficulty in walking, not elsewhere classified: Secondary | ICD-10-CM | POA: Diagnosis not present

## 2021-12-04 DIAGNOSIS — R278 Other lack of coordination: Secondary | ICD-10-CM | POA: Diagnosis not present

## 2021-12-08 ENCOUNTER — Ambulatory Visit (INDEPENDENT_AMBULATORY_CARE_PROVIDER_SITE_OTHER)

## 2021-12-08 DIAGNOSIS — R55 Syncope and collapse: Secondary | ICD-10-CM

## 2021-12-10 DIAGNOSIS — Z20822 Contact with and (suspected) exposure to covid-19: Secondary | ICD-10-CM | POA: Diagnosis not present

## 2021-12-15 DIAGNOSIS — Z20822 Contact with and (suspected) exposure to covid-19: Secondary | ICD-10-CM | POA: Diagnosis not present

## 2021-12-16 NOTE — Progress Notes (Signed)
Carelink Summary Report / Loop Recorder 

## 2021-12-17 DIAGNOSIS — Z20822 Contact with and (suspected) exposure to covid-19: Secondary | ICD-10-CM | POA: Diagnosis not present

## 2021-12-22 DIAGNOSIS — Z20822 Contact with and (suspected) exposure to covid-19: Secondary | ICD-10-CM | POA: Diagnosis not present

## 2021-12-23 DIAGNOSIS — R278 Other lack of coordination: Secondary | ICD-10-CM | POA: Diagnosis not present

## 2021-12-23 DIAGNOSIS — M6281 Muscle weakness (generalized): Secondary | ICD-10-CM | POA: Diagnosis not present

## 2021-12-23 DIAGNOSIS — R262 Difficulty in walking, not elsewhere classified: Secondary | ICD-10-CM | POA: Diagnosis not present

## 2021-12-24 DIAGNOSIS — R262 Difficulty in walking, not elsewhere classified: Secondary | ICD-10-CM | POA: Diagnosis not present

## 2021-12-24 DIAGNOSIS — M6281 Muscle weakness (generalized): Secondary | ICD-10-CM | POA: Diagnosis not present

## 2021-12-24 DIAGNOSIS — R278 Other lack of coordination: Secondary | ICD-10-CM | POA: Diagnosis not present

## 2021-12-30 DIAGNOSIS — R262 Difficulty in walking, not elsewhere classified: Secondary | ICD-10-CM | POA: Diagnosis not present

## 2021-12-30 DIAGNOSIS — R278 Other lack of coordination: Secondary | ICD-10-CM | POA: Diagnosis not present

## 2021-12-30 DIAGNOSIS — M6281 Muscle weakness (generalized): Secondary | ICD-10-CM | POA: Diagnosis not present

## 2022-01-13 DIAGNOSIS — R278 Other lack of coordination: Secondary | ICD-10-CM | POA: Diagnosis not present

## 2022-01-13 DIAGNOSIS — R262 Difficulty in walking, not elsewhere classified: Secondary | ICD-10-CM | POA: Diagnosis not present

## 2022-01-13 DIAGNOSIS — M6281 Muscle weakness (generalized): Secondary | ICD-10-CM | POA: Diagnosis not present

## 2022-01-14 ENCOUNTER — Other Ambulatory Visit: Payer: Self-pay | Admitting: Cardiology

## 2022-01-14 DIAGNOSIS — Z8673 Personal history of transient ischemic attack (TIA), and cerebral infarction without residual deficits: Secondary | ICD-10-CM

## 2022-01-15 DIAGNOSIS — R278 Other lack of coordination: Secondary | ICD-10-CM | POA: Diagnosis not present

## 2022-01-15 DIAGNOSIS — R262 Difficulty in walking, not elsewhere classified: Secondary | ICD-10-CM | POA: Diagnosis not present

## 2022-01-15 DIAGNOSIS — M6281 Muscle weakness (generalized): Secondary | ICD-10-CM | POA: Diagnosis not present

## 2022-01-18 DIAGNOSIS — R262 Difficulty in walking, not elsewhere classified: Secondary | ICD-10-CM | POA: Diagnosis not present

## 2022-01-18 DIAGNOSIS — R278 Other lack of coordination: Secondary | ICD-10-CM | POA: Diagnosis not present

## 2022-01-18 DIAGNOSIS — M6281 Muscle weakness (generalized): Secondary | ICD-10-CM | POA: Diagnosis not present

## 2022-01-19 DIAGNOSIS — R278 Other lack of coordination: Secondary | ICD-10-CM | POA: Diagnosis not present

## 2022-01-19 DIAGNOSIS — R262 Difficulty in walking, not elsewhere classified: Secondary | ICD-10-CM | POA: Diagnosis not present

## 2022-01-19 DIAGNOSIS — M6281 Muscle weakness (generalized): Secondary | ICD-10-CM | POA: Diagnosis not present

## 2022-01-23 DIAGNOSIS — R262 Difficulty in walking, not elsewhere classified: Secondary | ICD-10-CM | POA: Diagnosis not present

## 2022-01-23 DIAGNOSIS — M6281 Muscle weakness (generalized): Secondary | ICD-10-CM | POA: Diagnosis not present

## 2022-01-23 DIAGNOSIS — R278 Other lack of coordination: Secondary | ICD-10-CM | POA: Diagnosis not present

## 2022-01-26 DIAGNOSIS — R262 Difficulty in walking, not elsewhere classified: Secondary | ICD-10-CM | POA: Diagnosis not present

## 2022-01-26 DIAGNOSIS — R278 Other lack of coordination: Secondary | ICD-10-CM | POA: Diagnosis not present

## 2022-01-26 DIAGNOSIS — M6281 Muscle weakness (generalized): Secondary | ICD-10-CM | POA: Diagnosis not present

## 2022-01-28 DIAGNOSIS — M6281 Muscle weakness (generalized): Secondary | ICD-10-CM | POA: Diagnosis not present

## 2022-01-28 DIAGNOSIS — R262 Difficulty in walking, not elsewhere classified: Secondary | ICD-10-CM | POA: Diagnosis not present

## 2022-01-28 DIAGNOSIS — R278 Other lack of coordination: Secondary | ICD-10-CM | POA: Diagnosis not present

## 2022-02-01 DIAGNOSIS — M6281 Muscle weakness (generalized): Secondary | ICD-10-CM | POA: Diagnosis not present

## 2022-02-01 DIAGNOSIS — R262 Difficulty in walking, not elsewhere classified: Secondary | ICD-10-CM | POA: Diagnosis not present

## 2022-02-01 DIAGNOSIS — R278 Other lack of coordination: Secondary | ICD-10-CM | POA: Diagnosis not present

## 2022-02-02 DIAGNOSIS — Z85828 Personal history of other malignant neoplasm of skin: Secondary | ICD-10-CM | POA: Diagnosis not present

## 2022-02-02 DIAGNOSIS — L821 Other seborrheic keratosis: Secondary | ICD-10-CM | POA: Diagnosis not present

## 2022-02-02 DIAGNOSIS — L820 Inflamed seborrheic keratosis: Secondary | ICD-10-CM | POA: Diagnosis not present

## 2022-02-02 DIAGNOSIS — D485 Neoplasm of uncertain behavior of skin: Secondary | ICD-10-CM | POA: Diagnosis not present

## 2022-02-02 DIAGNOSIS — L814 Other melanin hyperpigmentation: Secondary | ICD-10-CM | POA: Diagnosis not present

## 2022-02-04 DIAGNOSIS — R262 Difficulty in walking, not elsewhere classified: Secondary | ICD-10-CM | POA: Diagnosis not present

## 2022-02-04 DIAGNOSIS — R278 Other lack of coordination: Secondary | ICD-10-CM | POA: Diagnosis not present

## 2022-02-04 DIAGNOSIS — M6281 Muscle weakness (generalized): Secondary | ICD-10-CM | POA: Diagnosis not present

## 2022-02-05 DIAGNOSIS — R262 Difficulty in walking, not elsewhere classified: Secondary | ICD-10-CM | POA: Diagnosis not present

## 2022-02-05 DIAGNOSIS — R278 Other lack of coordination: Secondary | ICD-10-CM | POA: Diagnosis not present

## 2022-02-05 DIAGNOSIS — M6281 Muscle weakness (generalized): Secondary | ICD-10-CM | POA: Diagnosis not present

## 2022-02-08 DIAGNOSIS — R278 Other lack of coordination: Secondary | ICD-10-CM | POA: Diagnosis not present

## 2022-02-08 DIAGNOSIS — M6281 Muscle weakness (generalized): Secondary | ICD-10-CM | POA: Diagnosis not present

## 2022-02-08 DIAGNOSIS — R262 Difficulty in walking, not elsewhere classified: Secondary | ICD-10-CM | POA: Diagnosis not present

## 2022-05-13 DIAGNOSIS — R262 Difficulty in walking, not elsewhere classified: Secondary | ICD-10-CM | POA: Diagnosis not present

## 2022-05-13 DIAGNOSIS — R278 Other lack of coordination: Secondary | ICD-10-CM | POA: Diagnosis not present

## 2022-05-13 DIAGNOSIS — M6281 Muscle weakness (generalized): Secondary | ICD-10-CM | POA: Diagnosis not present

## 2022-05-19 DIAGNOSIS — R278 Other lack of coordination: Secondary | ICD-10-CM | POA: Diagnosis not present

## 2022-05-19 DIAGNOSIS — M6281 Muscle weakness (generalized): Secondary | ICD-10-CM | POA: Diagnosis not present

## 2022-05-19 DIAGNOSIS — R262 Difficulty in walking, not elsewhere classified: Secondary | ICD-10-CM | POA: Diagnosis not present

## 2022-05-21 DIAGNOSIS — M6281 Muscle weakness (generalized): Secondary | ICD-10-CM | POA: Diagnosis not present

## 2022-05-21 DIAGNOSIS — R278 Other lack of coordination: Secondary | ICD-10-CM | POA: Diagnosis not present

## 2022-05-21 DIAGNOSIS — R262 Difficulty in walking, not elsewhere classified: Secondary | ICD-10-CM | POA: Diagnosis not present

## 2022-05-25 DIAGNOSIS — M6281 Muscle weakness (generalized): Secondary | ICD-10-CM | POA: Diagnosis not present

## 2022-05-25 DIAGNOSIS — R262 Difficulty in walking, not elsewhere classified: Secondary | ICD-10-CM | POA: Diagnosis not present

## 2022-05-25 DIAGNOSIS — R278 Other lack of coordination: Secondary | ICD-10-CM | POA: Diagnosis not present

## 2022-05-26 DIAGNOSIS — R262 Difficulty in walking, not elsewhere classified: Secondary | ICD-10-CM | POA: Diagnosis not present

## 2022-05-26 DIAGNOSIS — M6281 Muscle weakness (generalized): Secondary | ICD-10-CM | POA: Diagnosis not present

## 2022-05-26 DIAGNOSIS — R278 Other lack of coordination: Secondary | ICD-10-CM | POA: Diagnosis not present

## 2022-05-27 DIAGNOSIS — B351 Tinea unguium: Secondary | ICD-10-CM | POA: Diagnosis not present

## 2022-05-27 DIAGNOSIS — I739 Peripheral vascular disease, unspecified: Secondary | ICD-10-CM | POA: Diagnosis not present

## 2022-05-27 DIAGNOSIS — M2011 Hallux valgus (acquired), right foot: Secondary | ICD-10-CM | POA: Diagnosis not present

## 2022-05-28 DIAGNOSIS — M6281 Muscle weakness (generalized): Secondary | ICD-10-CM | POA: Diagnosis not present

## 2022-05-28 DIAGNOSIS — R262 Difficulty in walking, not elsewhere classified: Secondary | ICD-10-CM | POA: Diagnosis not present

## 2022-05-28 DIAGNOSIS — R278 Other lack of coordination: Secondary | ICD-10-CM | POA: Diagnosis not present

## 2022-05-30 DIAGNOSIS — R278 Other lack of coordination: Secondary | ICD-10-CM | POA: Diagnosis not present

## 2022-05-30 DIAGNOSIS — R262 Difficulty in walking, not elsewhere classified: Secondary | ICD-10-CM | POA: Diagnosis not present

## 2022-05-30 DIAGNOSIS — M6281 Muscle weakness (generalized): Secondary | ICD-10-CM | POA: Diagnosis not present

## 2022-06-01 DIAGNOSIS — M6281 Muscle weakness (generalized): Secondary | ICD-10-CM | POA: Diagnosis not present

## 2022-06-01 DIAGNOSIS — R262 Difficulty in walking, not elsewhere classified: Secondary | ICD-10-CM | POA: Diagnosis not present

## 2022-06-01 DIAGNOSIS — R278 Other lack of coordination: Secondary | ICD-10-CM | POA: Diagnosis not present

## 2022-06-02 DIAGNOSIS — M6281 Muscle weakness (generalized): Secondary | ICD-10-CM | POA: Diagnosis not present

## 2022-06-02 DIAGNOSIS — R262 Difficulty in walking, not elsewhere classified: Secondary | ICD-10-CM | POA: Diagnosis not present

## 2022-06-02 DIAGNOSIS — R278 Other lack of coordination: Secondary | ICD-10-CM | POA: Diagnosis not present

## 2022-09-23 ENCOUNTER — Telehealth: Payer: Self-pay | Admitting: Cardiology

## 2022-09-23 NOTE — Telephone Encounter (Signed)
Spoke with the patient's daughter who states that the patient is in hospice and they are looking at stopping her Plavix and putting her back on a low dose aspirin. Daughter would like to know if Dr. Radford Pax thinks this would be okay.

## 2022-09-23 NOTE — Telephone Encounter (Signed)
Patient's daughter called and said that patient is under hospice and to come off blood thinner and to be on a low dose aspirin. Would like for someone to give her a call

## 2022-09-24 NOTE — Telephone Encounter (Signed)
Gave advice to patient's daughter. She verbalized understanding.

## 2022-09-24 NOTE — Telephone Encounter (Signed)
Spoke with the patient's daughter who states that the patient's PCP has retired. She states that the patient is supposed to be seeing a PA at that office now and will reach out, however would value Dr. Theodosia Blender opinion over the PA's.

## 2024-02-29 ENCOUNTER — Telehealth: Payer: Self-pay | Admitting: Cardiology

## 2024-02-29 NOTE — Telephone Encounter (Signed)
 Pts daughter was asking about mestinon.. the pt is in assisted living and wanted to be sure that this was the med being used for her BP.

## 2024-02-29 NOTE — Telephone Encounter (Signed)
 Daughter states patient has been at assisted living and they are unable to locate the name of patient's BP medication. She says Dr. Mayford Knife prescribed it to increase BP because she's hypotensive. Please advise.
